# Patient Record
Sex: Female | Born: 1960 | Race: White | Hispanic: No | Marital: Married | Smoking: Former smoker
Health system: Southern US, Community
[De-identification: ages and names within clinical notes are randomized; demographics above are authoritative.]

## PROBLEM LIST (undated history)

## (undated) DIAGNOSIS — C50919 Malignant neoplasm of unspecified site of unspecified female breast: Secondary | ICD-10-CM

## (undated) DIAGNOSIS — F32A Depression, unspecified: Secondary | ICD-10-CM

## (undated) DIAGNOSIS — D649 Anemia, unspecified: Secondary | ICD-10-CM

## (undated) DIAGNOSIS — N200 Calculus of kidney: Secondary | ICD-10-CM

## (undated) DIAGNOSIS — Z923 Personal history of irradiation: Secondary | ICD-10-CM

## (undated) DIAGNOSIS — R238 Other skin changes: Secondary | ICD-10-CM

## (undated) DIAGNOSIS — R233 Spontaneous ecchymoses: Secondary | ICD-10-CM

## (undated) DIAGNOSIS — Z973 Presence of spectacles and contact lenses: Secondary | ICD-10-CM

## (undated) DIAGNOSIS — F329 Major depressive disorder, single episode, unspecified: Secondary | ICD-10-CM

## (undated) DIAGNOSIS — R002 Palpitations: Secondary | ICD-10-CM

## (undated) HISTORY — DX: Spontaneous ecchymoses: R23.3

## (undated) HISTORY — DX: Calculus of kidney: N20.0

## (undated) HISTORY — DX: Presence of spectacles and contact lenses: Z97.3

## (undated) HISTORY — DX: Other skin changes: R23.8

## (undated) HISTORY — PX: KIDNEY STONE SURGERY: SHX686

## (undated) HISTORY — DX: Malignant neoplasm of unspecified site of unspecified female breast: C50.919

## (undated) HISTORY — DX: Major depressive disorder, single episode, unspecified: F32.9

## (undated) HISTORY — DX: Anemia, unspecified: D64.9

## (undated) HISTORY — DX: Depression, unspecified: F32.A

## (undated) HISTORY — DX: Palpitations: R00.2

---

## 1991-09-12 HISTORY — PX: DILATION AND CURETTAGE OF UTERUS: SHX78

## 1998-01-13 ENCOUNTER — Other Ambulatory Visit: Admission: RE | Admit: 1998-01-13 | Discharge: 1998-01-13 | Payer: Self-pay | Admitting: Obstetrics and Gynecology

## 1999-12-02 ENCOUNTER — Encounter: Admission: RE | Admit: 1999-12-02 | Discharge: 1999-12-02 | Payer: Self-pay | Admitting: Obstetrics and Gynecology

## 1999-12-02 ENCOUNTER — Encounter: Payer: Self-pay | Admitting: Obstetrics and Gynecology

## 2000-12-04 ENCOUNTER — Encounter: Payer: Self-pay | Admitting: Obstetrics and Gynecology

## 2000-12-04 ENCOUNTER — Encounter: Admission: RE | Admit: 2000-12-04 | Discharge: 2000-12-04 | Payer: Self-pay | Admitting: Obstetrics and Gynecology

## 2001-12-05 ENCOUNTER — Encounter: Admission: RE | Admit: 2001-12-05 | Discharge: 2001-12-05 | Payer: Self-pay | Admitting: Obstetrics and Gynecology

## 2001-12-05 ENCOUNTER — Encounter: Payer: Self-pay | Admitting: Obstetrics and Gynecology

## 2002-12-08 ENCOUNTER — Encounter: Admission: RE | Admit: 2002-12-08 | Discharge: 2002-12-08 | Payer: Self-pay | Admitting: Obstetrics and Gynecology

## 2002-12-08 ENCOUNTER — Encounter: Payer: Self-pay | Admitting: Obstetrics and Gynecology

## 2004-02-25 ENCOUNTER — Encounter: Admission: RE | Admit: 2004-02-25 | Discharge: 2004-02-25 | Payer: Self-pay | Admitting: Obstetrics and Gynecology

## 2004-07-21 ENCOUNTER — Ambulatory Visit: Payer: Self-pay | Admitting: Family Medicine

## 2004-07-25 ENCOUNTER — Ambulatory Visit: Payer: Self-pay | Admitting: Family Medicine

## 2004-11-14 ENCOUNTER — Ambulatory Visit: Payer: Self-pay | Admitting: Family Medicine

## 2005-01-09 ENCOUNTER — Ambulatory Visit: Payer: Self-pay | Admitting: Family Medicine

## 2005-01-25 ENCOUNTER — Ambulatory Visit: Payer: Self-pay | Admitting: Family Medicine

## 2005-04-26 ENCOUNTER — Encounter: Admission: RE | Admit: 2005-04-26 | Discharge: 2005-04-26 | Payer: Self-pay | Admitting: Obstetrics and Gynecology

## 2005-05-04 ENCOUNTER — Ambulatory Visit: Payer: Self-pay | Admitting: Family Medicine

## 2005-10-09 ENCOUNTER — Ambulatory Visit: Payer: Self-pay | Admitting: Family Medicine

## 2005-11-14 ENCOUNTER — Ambulatory Visit: Payer: Self-pay | Admitting: Family Medicine

## 2005-12-14 ENCOUNTER — Ambulatory Visit: Payer: Self-pay | Admitting: Family Medicine

## 2006-01-01 ENCOUNTER — Ambulatory Visit: Payer: Self-pay | Admitting: Family Medicine

## 2006-02-20 ENCOUNTER — Encounter: Admission: RE | Admit: 2006-02-20 | Discharge: 2006-02-20 | Payer: Self-pay | Admitting: Family Medicine

## 2006-06-18 ENCOUNTER — Ambulatory Visit: Payer: Self-pay | Admitting: Family Medicine

## 2006-06-27 ENCOUNTER — Encounter: Admission: RE | Admit: 2006-06-27 | Discharge: 2006-06-27 | Payer: Self-pay | Admitting: Obstetrics and Gynecology

## 2006-08-21 ENCOUNTER — Ambulatory Visit: Payer: Self-pay | Admitting: Family Medicine

## 2006-09-24 ENCOUNTER — Ambulatory Visit: Payer: Self-pay | Admitting: Family Medicine

## 2006-11-08 ENCOUNTER — Ambulatory Visit: Payer: Self-pay | Admitting: Family Medicine

## 2006-11-13 ENCOUNTER — Ambulatory Visit: Payer: Self-pay | Admitting: Family Medicine

## 2006-11-15 ENCOUNTER — Ambulatory Visit: Payer: Self-pay | Admitting: Family Medicine

## 2006-11-15 ENCOUNTER — Encounter: Admission: RE | Admit: 2006-11-15 | Discharge: 2006-11-15 | Payer: Self-pay | Admitting: Obstetrics and Gynecology

## 2006-11-26 ENCOUNTER — Ambulatory Visit (HOSPITAL_COMMUNITY): Admission: RE | Admit: 2006-11-26 | Discharge: 2006-11-26 | Payer: Self-pay | Admitting: Urology

## 2006-12-10 ENCOUNTER — Ambulatory Visit: Payer: Self-pay | Admitting: Family Medicine

## 2007-01-16 ENCOUNTER — Ambulatory Visit: Payer: Self-pay | Admitting: Family Medicine

## 2007-07-25 ENCOUNTER — Encounter: Admission: RE | Admit: 2007-07-25 | Discharge: 2007-07-25 | Payer: Self-pay | Admitting: Obstetrics and Gynecology

## 2008-09-24 ENCOUNTER — Encounter: Admission: RE | Admit: 2008-09-24 | Discharge: 2008-09-24 | Payer: Self-pay | Admitting: Obstetrics and Gynecology

## 2009-07-06 ENCOUNTER — Ambulatory Visit: Payer: Self-pay | Admitting: Gastroenterology

## 2009-10-13 ENCOUNTER — Encounter: Admission: RE | Admit: 2009-10-13 | Discharge: 2009-10-13 | Payer: Self-pay | Admitting: Obstetrics and Gynecology

## 2010-05-27 ENCOUNTER — Ambulatory Visit: Payer: Self-pay | Admitting: Internal Medicine

## 2010-11-28 ENCOUNTER — Encounter: Payer: Self-pay | Admitting: Nurse Practitioner

## 2010-11-28 DIAGNOSIS — R002 Palpitations: Secondary | ICD-10-CM | POA: Insufficient documentation

## 2010-11-28 DIAGNOSIS — F418 Other specified anxiety disorders: Secondary | ICD-10-CM | POA: Insufficient documentation

## 2010-11-28 DIAGNOSIS — N200 Calculus of kidney: Secondary | ICD-10-CM | POA: Insufficient documentation

## 2010-11-28 DIAGNOSIS — R000 Tachycardia, unspecified: Secondary | ICD-10-CM

## 2010-12-13 ENCOUNTER — Other Ambulatory Visit: Payer: Self-pay | Admitting: Obstetrics and Gynecology

## 2010-12-13 DIAGNOSIS — Z1231 Encounter for screening mammogram for malignant neoplasm of breast: Secondary | ICD-10-CM

## 2010-12-21 ENCOUNTER — Ambulatory Visit
Admission: RE | Admit: 2010-12-21 | Discharge: 2010-12-21 | Disposition: A | Discharge status: Home / Self Care | Payer: BC Managed Care – PPO | Source: Ambulatory Visit | Attending: Obstetrics and Gynecology | Admitting: Obstetrics and Gynecology

## 2010-12-21 DIAGNOSIS — Z1231 Encounter for screening mammogram for malignant neoplasm of breast: Secondary | ICD-10-CM

## 2010-12-23 ENCOUNTER — Other Ambulatory Visit: Payer: Self-pay | Admitting: Obstetrics and Gynecology

## 2010-12-23 DIAGNOSIS — R928 Other abnormal and inconclusive findings on diagnostic imaging of breast: Secondary | ICD-10-CM

## 2011-01-03 ENCOUNTER — Ambulatory Visit
Admission: RE | Admit: 2011-01-03 | Discharge: 2011-01-03 | Disposition: A | Discharge status: Home / Self Care | Payer: BC Managed Care – PPO | Source: Ambulatory Visit | Attending: Obstetrics and Gynecology | Admitting: Obstetrics and Gynecology

## 2011-01-03 DIAGNOSIS — R928 Other abnormal and inconclusive findings on diagnostic imaging of breast: Secondary | ICD-10-CM

## 2011-01-27 NOTE — Op Note (Signed)
NAMEMAKAIYA, Mandy Vargas NO.:  [id number]   MEDICAL RECORD NO.:  [id number]          PATIENT TYPE:  AMB   LOCATION:  DAY                          FACILITY:  [facility]   PHYSICIAN:  Sigmund I. Patsi Sears, M.D.DATE OF BIRTH:  Mar 30, 1961   DATE OF PROCEDURE:  11/26/2006  DATE OF DISCHARGE:                               OPERATIVE REPORT   PREOP DIAGNOSIS:  Impacted left lower ureteral calculus.   POSTOPERATIVE DIAGNOSIS:  Impacted left lower ureteral calculus.   OPERATION:  1. Cystourethroscopy.  2. Left retrograde pyelogram with interpretation.  3. Ureteroscopy.  4. Laser fractionation of left ureteral calculus.  5. Basket extraction of the left ureteral stone.   SURGEON:  Sigmund I. Patsi Sears, M.D.   ANESTHESIA:  General LMA.   OPERATION:  After appropriate preanesthesia, the patient was brought to  the operating room, and placed on the operating table in the dorsal  supine position where general LMA anesthesia was introduced.  She was  then replaced in dorsal lithotomy position where the pubis was prepped  with Betadine solution and draped in the usual fashion.   REVIEW OF HISTORY:  This 50 year old female has had left lower quadrant  and left flank pain, with gross hematuria.  She is known to have a left  UV junction stone measuring 6 mm, but has not passed the stone.  She is  now for basket extraction of the stone.   PROCEDURE:  Cystourethroscopy was accomplished and a left retrograde  pyelogram was accomplished.  Stone was not easily identified on  retrograde pyelogram.  The short ureteroscope was placed in the lower  ureter, and ureteroscopy was accomplished, and finds that the ureter was  dilated.  The stone was identified; and finds that the ureter is  dilated; and the stone was identified floating at the upper third  ureter.  The stone appeared to be rectangular in formation and was photo  photographed.   The laser fiber was passed through the ureter,  and the stone fragmented  in two pieces.  The stone was then basket extracted.  Repeat  ureteroscopy revealed no stone; and repeat retrograde pyelogram revealed  no evidence of further  stone material.  Because of the atraumatic nature of the surgery,  because the patient did not want a double-J catheter; it was elected to  not place a double-J catheter.  Therefore, the ureteroscope and the  guide wire were removed.  She was given IV Toradol, awakened, and taken  to recovery room in good condition.      Sigmund I. Patsi Sears, M.D.  Electronically Signed     SIT/MEDQ  D:  11/26/2006  T:  11/27/2006  Job:  [id number]

## 2011-05-04 ENCOUNTER — Other Ambulatory Visit: Payer: Self-pay | Admitting: Obstetrics and Gynecology

## 2011-05-04 DIAGNOSIS — R921 Mammographic calcification found on diagnostic imaging of breast: Secondary | ICD-10-CM

## 2011-07-06 ENCOUNTER — Ambulatory Visit
Admission: RE | Admit: 2011-07-06 | Discharge: 2011-07-06 | Disposition: A | Discharge status: Home / Self Care | Payer: BC Managed Care – PPO | Source: Ambulatory Visit | Attending: Obstetrics and Gynecology | Admitting: Obstetrics and Gynecology

## 2011-07-06 DIAGNOSIS — R921 Mammographic calcification found on diagnostic imaging of breast: Secondary | ICD-10-CM

## 2011-09-12 DIAGNOSIS — C50919 Malignant neoplasm of unspecified site of unspecified female breast: Secondary | ICD-10-CM

## 2011-09-12 HISTORY — DX: Malignant neoplasm of unspecified site of unspecified female breast: C50.919

## 2011-12-11 ENCOUNTER — Other Ambulatory Visit: Payer: Self-pay | Admitting: Obstetrics and Gynecology

## 2011-12-11 DIAGNOSIS — R921 Mammographic calcification found on diagnostic imaging of breast: Secondary | ICD-10-CM

## 2011-12-15 ENCOUNTER — Other Ambulatory Visit: Payer: Self-pay | Admitting: Obstetrics and Gynecology

## 2011-12-15 ENCOUNTER — Ambulatory Visit
Admission: RE | Admit: 2011-12-15 | Discharge: 2011-12-15 | Disposition: A | Discharge status: Home / Self Care | Payer: BC Managed Care – PPO | Source: Ambulatory Visit | Attending: Obstetrics and Gynecology | Admitting: Obstetrics and Gynecology

## 2011-12-15 DIAGNOSIS — R921 Mammographic calcification found on diagnostic imaging of breast: Secondary | ICD-10-CM

## 2011-12-22 ENCOUNTER — Ambulatory Visit
Admission: RE | Admit: 2011-12-22 | Discharge: 2011-12-22 | Disposition: A | Discharge status: Home / Self Care | Payer: BC Managed Care – PPO | Source: Ambulatory Visit | Attending: Obstetrics and Gynecology | Admitting: Obstetrics and Gynecology

## 2011-12-22 DIAGNOSIS — R921 Mammographic calcification found on diagnostic imaging of breast: Secondary | ICD-10-CM

## 2011-12-22 HISTORY — PX: BREAST BIOPSY: SHX20

## 2011-12-25 ENCOUNTER — Other Ambulatory Visit: Payer: Self-pay | Admitting: Obstetrics and Gynecology

## 2011-12-25 DIAGNOSIS — C50912 Malignant neoplasm of unspecified site of left female breast: Secondary | ICD-10-CM

## 2011-12-26 ENCOUNTER — Other Ambulatory Visit: Payer: Self-pay | Admitting: *Deleted

## 2011-12-26 ENCOUNTER — Telehealth: Payer: Self-pay | Admitting: *Deleted

## 2011-12-26 DIAGNOSIS — D0510 Intraductal carcinoma in situ of unspecified breast: Secondary | ICD-10-CM

## 2011-12-26 NOTE — Telephone Encounter (Signed)
Confirmed [facility] for 01/03/12 at 1200 .  Instructions and contact information given.  

## 2011-12-28 ENCOUNTER — Other Ambulatory Visit: Payer: BC Managed Care – PPO

## 2011-12-28 ENCOUNTER — Ambulatory Visit
Admission: RE | Admit: 2011-12-28 | Discharge: 2011-12-28 | Disposition: A | Discharge status: Home / Self Care | Payer: BC Managed Care – PPO | Source: Ambulatory Visit | Attending: Obstetrics and Gynecology | Admitting: Obstetrics and Gynecology

## 2011-12-28 DIAGNOSIS — C50912 Malignant neoplasm of unspecified site of left female breast: Secondary | ICD-10-CM

## 2011-12-28 MED ORDER — GADOBENATE DIMEGLUMINE 529 MG/ML IV SOLN
12.0000 mL | Freq: Once | INTRAVENOUS | Status: AC | PRN
  Administered 2011-12-28: 12 mL via INTRAVENOUS

## 2012-01-03 ENCOUNTER — Encounter: Payer: Self-pay | Admitting: Oncology

## 2012-01-03 ENCOUNTER — Ambulatory Visit: Payer: BC Managed Care – PPO | Admitting: Oncology

## 2012-01-03 ENCOUNTER — Ambulatory Visit (HOSPITAL_BASED_OUTPATIENT_CLINIC_OR_DEPARTMENT_OTHER): Payer: BC Managed Care – PPO | Admitting: Surgery

## 2012-01-03 ENCOUNTER — Ambulatory Visit: Payer: BC Managed Care – PPO

## 2012-01-03 ENCOUNTER — Other Ambulatory Visit: Payer: BC Managed Care – PPO

## 2012-01-03 ENCOUNTER — Other Ambulatory Visit (INDEPENDENT_AMBULATORY_CARE_PROVIDER_SITE_OTHER): Payer: Self-pay | Admitting: Surgery

## 2012-01-03 ENCOUNTER — Telehealth: Payer: Self-pay | Admitting: Oncology

## 2012-01-03 ENCOUNTER — Encounter: Payer: Self-pay | Admitting: *Deleted

## 2012-01-03 ENCOUNTER — Other Ambulatory Visit: Payer: BC Managed Care – PPO | Admitting: Lab

## 2012-01-03 ENCOUNTER — Ambulatory Visit (HOSPITAL_BASED_OUTPATIENT_CLINIC_OR_DEPARTMENT_OTHER): Payer: BC Managed Care – PPO | Admitting: Oncology

## 2012-01-03 ENCOUNTER — Ambulatory Visit
Admission: RE | Admit: 2012-01-03 | Discharge: 2012-01-03 | Disposition: A | Discharge status: Home / Self Care | Payer: BC Managed Care – PPO | Source: Ambulatory Visit | Attending: Radiation Oncology | Admitting: Radiation Oncology

## 2012-01-03 ENCOUNTER — Ambulatory Visit: Payer: BC Managed Care – PPO | Admitting: Physical Therapy

## 2012-01-03 VITALS — BP 100/66 | HR 62 | Temp 98.8°F | Ht 61.0 in | Wt 129.6 lb

## 2012-01-03 DIAGNOSIS — D0590 Unspecified type of carcinoma in situ of unspecified breast: Secondary | ICD-10-CM | POA: Insufficient documentation

## 2012-01-03 DIAGNOSIS — Z79899 Other long term (current) drug therapy: Secondary | ICD-10-CM | POA: Insufficient documentation

## 2012-01-03 DIAGNOSIS — D0510 Intraductal carcinoma in situ of unspecified breast: Secondary | ICD-10-CM

## 2012-01-03 DIAGNOSIS — Z800 Family history of malignant neoplasm of digestive organs: Secondary | ICD-10-CM | POA: Insufficient documentation

## 2012-01-03 DIAGNOSIS — Z803 Family history of malignant neoplasm of breast: Secondary | ICD-10-CM | POA: Insufficient documentation

## 2012-01-03 LAB — COMPREHENSIVE METABOLIC PANEL
ALT: 14 U/L (ref 0–35)
AST: 19 U/L (ref 0–37)
Alkaline Phosphatase: 54 U/L (ref 39–117)
CO2: 29 mEq/L (ref 19–32)
Calcium: 9.3 mg/dL (ref 8.4–10.5)
Chloride: 100 mEq/L (ref 96–112)
Glucose, Bld: 91 mg/dL (ref 70–99)
Potassium: 3.8 mEq/L (ref 3.5–5.3)
Sodium: 137 mEq/L (ref 135–145)
Total Protein: 6.8 g/dL (ref 6.0–8.3)

## 2012-01-03 LAB — CBC WITH DIFFERENTIAL/PLATELET
EOS%: 1.8 % (ref 0.0–7.0)
HGB: 12.6 g/dL (ref 11.6–15.9)
MCH: 30.7 pg (ref 25.1–34.0)
NEUT#: 4.2 10*3/uL (ref 1.5–6.5)
RBC: 4.1 10*6/uL (ref 3.70–5.45)
RDW: 12.8 % (ref 11.2–14.5)
WBC: 5.8 10*3/uL (ref 3.9–10.3)
lymph#: 1.2 10*3/uL (ref 0.9–3.3)
nRBC: 0 % (ref 0–0)

## 2012-01-03 NOTE — Progress Notes (Addendum)
Re:   Mandy Vargas DOB:   April 29, 1961 MRN:   [id number]  [facility]  ASSESSMENT AND PLAN: 1.  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  Will need wire localization.  Tumor is located in the upper central portion of left breast on MRI.  I discussed the options for breast cancer treatment with the patient.  I discussed the idea of a multidisciplinary approach to the treatment of breast cancer, which often includes medical oncology and radiation oncology.  I discussed the surgical options of lumpectomy vs. mastectomy.  I discussed the options of lymph node biopsy.   The risks of surgery include, but are not limited to, bleeding, infection, the need for further surgery, and nerve injury.  Seeing Drs. Park Breed and Michell Heinrich.  2.  Irregular heart rate/palpitations, on Inderal. 3.  Depression.  Has seen Dr. Lafayette Dragon once. 4.  History of kidney stones. 5.  For genetic testing first, before posting surgery, since it would dramatically change the planned surgery. [Genetic testing was negative.  DN 02/02/2012]  REFERRING PHYSICIAN:  Dr. Orvan July, [facility]  HISTORY OF PRESENT ILLNESS: Mandy Vargas is a 51 y.o. (DOB: 10-07-1960)  white female whose primary care physician is Dr. Orvan July, [facility], and comes to [facility] today for a left breast cancer.  She comes by herself.  She comes by her self.  She said the [facility] had been following some microcalcifications.  Because of changes in the microcalcifications, she had a biopsy 12/22/2011 which showed DCIS.  She is still having periods.  She is on BCP, which she will stop.  She has had no prior breast biopsy.  She has a sister, Etheleen Sia, who had breast cancer treated by Dr. Cindee Lame in 2008.    Past Medical History  Diagnosis Date  . Irregular heartbeat   . Irregular heart beat   . Wears glasses   . Bruises easily   . Depression   . Anemia   . Breast cancer DCIS      Past Surgical History    Procedure Date  . Dilation and curettage of uterus 1993  . Kidney stone surgery       Current Outpatient Prescriptions  Medication Sig Dispense Refill  . buPROPion (WELLBUTRIN) 100 MG tablet Take 100 mg by mouth 2 (two) times daily.        . citalopram (CELEXA) 40 MG tablet Take 40 mg by mouth daily.      . clonazePAM (KLONOPIN) 1 MG tablet Take 1 mg by mouth 2 (two) times daily as needed.        . desogestrel-ethinyl estradiol (KARIVA,AZURETTE,MIRCETTE) 0.15-0.02/0.01 MG (21/5) tablet Take 1 tablet by mouth daily.      . Multiple Vitamin (MULTIVITAMIN) tablet Take 1 tablet by mouth daily.      . propranolol (INDERAL) 60 MG tablet Take 60 mg by mouth daily at 8 pm.         No Known Allergies  REVIEW OF SYSTEMS: Skin:  No history of rash.  No history of abnormal moles. Infection:  No history of hepatitis or HIV.  No history of MRSA. Neurologic:  No history of stroke.  No history of seizure.  No history of headaches. Cardiac:  Irregular heart rate/palpitations.  Does not see a cardiologist. Pulmonary:  Does not smoke cigarettes.  No asthma or bronchitis.  Endocrine:  No diabetes. No thyroid disease. Gastrointestinal:  No history of  stomach disease.  No history of liver disease.  No history of gall bladder disease.  No history of pancreas disease.  No history of colon disease. GYN:  Has seen Dr. Charma Igo in the past. Urologic:  No history of kidney stones.  No history of bladder infections. Musculoskeletal:  No history of joint or back disease. Hematologic:  No bleeding disorder.  No history of anemia. Psycho-social:  The patient is oriented.  History of depression.  SOCIAL and FAMILY HISTORY: Married, though husband not with patient.  He is a Visual merchandiser. 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 One son - 69.  PHYSICAL EXAM: LMP 12/01/2011  General: WN WF who is alert and generally healthy appearing.  HEENT: Normal. Pupils equal. Good  dentition. Neck: Supple. No mass.  No thyroid mass.  Carotid pulse okay with no bruit. Lymph Nodes:  No supraclavicular or cervical nodes. Lungs: Clear to auscultation and symmetric breath sounds. Heart:  RRR. No murmur or rub. Breasts:  Left: Scar at 4 o'clock position in left breast.  Right: No abnormality.  Abdomen: Soft. No mass. No tenderness. No hernia. Normal bowel sounds.  No abdominal scars. Rectal: Not done. Extremities:  Good strength and ROM  in upper and lower extremities. Neurologic:  Grossly intact to motor and sensory function. Psychiatric: Has normal mood and affect. Behavior is normal.   DATA REVIEWED: Path report and mammograms  Ovidio Kin, MD,  [facility] [address] [place]  [address]   [place], [place]    [address] Phone:  [phone] FAX:  [phone]

## 2012-01-03 NOTE — Telephone Encounter (Signed)
S/w the pt and she is aware of her may 2013 appt °

## 2012-01-03 NOTE — Progress Notes (Signed)
Patient came in today as a new patient she has one insurance,she said she thinks she is [place] for now but she would call us if she need any assistance.I gave her Alcario Drought card.

## 2012-01-03 NOTE — Patient Instructions (Signed)
1. Genetic testing  2. B-43 clinical trial  3. I will see you back in 1 month

## 2012-01-03 NOTE — Progress Notes (Signed)
Mailed after appt letter to pt. 

## 2012-01-03 NOTE — Progress Notes (Signed)
Mandy Vargas [id number] 1960/10/25 51 y.o. 01/03/2012 9:46 AM  CC Dr. Mathis Bud Dr. Lurline Hare Dr. Leodis Sias Dr. Marcelle Overlie  REASON FOR CONSULTATION:  51 year old female with new diagnosis of ductal carcinoma in situ of the left breast diagnosed 12/22/2011. Patient was seen in the multidisciplinary breast clinic today for discussion of treatment options.  STAGE:   Stage 0 (DCIS)  REFERRING PHYSICIAN: Dr. Ovidio Kin  HISTORY OF PRESENT ILLNESS:  Mandy Vargas is a 51 y.o. female.  With medical history significant for depression and. Irregular heart beat. Patient has been receiving mammograms since the age of 8. About 6 months ago she was found to have calcifications. She had a followup six-month mammogram performed on 12/15/2011 the left breast revealed suspicious calcifications on a monogram. She went on to have a diagnostic mammogram and thereafter had an ultrasound-guided biopsy performed on 12/22/2011. The biopsy showed a ductal carcinoma in situ with calcifications appearing as an intermediate grade. The tumor was estrogen receptor +100% progesterone receptor +100%. Patient then on 12/28/2011 had an MRI of the breasts performed that showed upper central portion of left breast with biopsy changes and area of 9 x 9 x 6 mm. She is now seen in the multidisciplinary breast clinic with for discussion of her treatment options. Her case was discussed at the multidisciplinary breast conference this morning. She is without any complaints other than being very anxious about her treatment options.   Past Medical History: Past Medical History  Diagnosis Date  . Irregular heartbeat   . Irregular heart beat   . Wears glasses   . Bruises easily   . Depression   . Anemia   . Breast cancer DCIS    Past Surgical History: Past Surgical History  Procedure Date  . Dilation and curettage of uterus 1993  . Kidney stone surgery     Family History: Family History  Problem  Relation Age of Onset  . Colon cancer Mother   . Breast cancer Sister   . 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 She is not a smoker does not drink no history of alcohol use or any other recreational drug use. History  Substance Use Topics  . Smoking status: Never Smoker   . Smokeless tobacco: Not on file  . Alcohol Use: No    Allergies: No Known Allergies  Current Medications: Current Outpatient Prescriptions  Medication Sig Dispense Refill  . buPROPion (WELLBUTRIN) 100 MG tablet Take 100 mg by mouth 2 (two) times daily.        . citalopram (CELEXA) 40 MG tablet Take 40 mg by mouth daily.      . clonazePAM (KLONOPIN) 1 MG tablet Take 1 mg by mouth 2 (two) times daily as needed.        . desogestrel-ethinyl estradiol (KARIVA,AZURETTE,MIRCETTE) 0.15-0.02/0.01 MG (21/5) tablet Take 1 tablet by mouth daily.      . Multiple Vitamin (MULTIVITAMIN) tablet Take 1 tablet by mouth daily.      . propranolol (INDERAL) 60 MG tablet Take 60 mg by mouth daily at 8 pm.        OB/GYN History:Menarche at age 14 she is premenopausal her last period was about 3 weeks ago. She had her first term pregnancy at age 33. She has completed all of her family. She had a colonoscopy in 2011 bone density 2010 last Pap smear  November 2012 first mammogram in 2000.  ECOG PERFORMANCE STATUS: 0 - Asymptomatic  Genetic Counseling/testing: A complete history was taken patient's father is deceased at age 80 patient's mother is alive at age 7 she has one sister. Paternal grandmother had colon cancer in her 46s there are mother had a malignant polyp that was removed and patient has a sister who had breast cancer 43 there is also an aunt with breast cancer. Because of family history patient was recommended genetic counseling and testing and a referral has been made.  REVIEW OF SYSTEMS: Currently patient denies any fevers  chills night sweats headaches shortness of breath chest pains palpitations she has no nausea or vomiting no myalgias or arthralgias she is depressed but this is chronic ongoing she is on multiple medications she does also take medications to help her sleep at night. She has no hematuria hematochezia melena hemoptysis hematemesis no weight loss or weight gain and remainder of the 14 point review of systems is negative.  PHYSICAL EXAMINATION: Blood pressure 100/66, pulse 62, temperature 98.8 F (37.1 C), temperature source Oral, height 5\' 1"  (1.549 m), weight 129 lb 9.6 oz (58.786 kg), last menstrual period 12/01/2011.  WUJ:WJXBJ, healthy, no distress, well nourished, well developed and anxious SKIN: skin color, texture, turgor are normal, no rashes or significant lesions HEAD: Normocephalic EYES: PERRLA, EOMI, Conjunctiva are pink and non-injected, sclera clear EARS: External ears normal OROPHARYNX:no exudate, no erythema and lips, buccal mucosa, and tongue normal  NECK: supple, no adenopathy, no stridor, non-tender LYMPH:  no palpable lymphadenopathy, no hepatosplenomegaly BREAST:Left breast reveals an area of ecchymosis without any palpable masses or nipple discharge. Right breast no masses or nipple discharge. LUNGS: clear to auscultation and percussion HEART: regular rate & rhythm, no murmurs and no gallops ABDOMEN:abdomen soft, non-tender, normal bowel sounds and no masses or organomegaly BACK: Back symmetric, no curvature. EXTREMITIES:no edema, no clubbing, no cyanosis  NEURO: alert & oriented x 3 with fluent speech, no focal motor/sensory deficits, gait normal  STUDIES/RESULTS: Mr Breast Bilateral W Wo Contrast  12/28/2011  *RADIOLOGY REPORT*  Clinical Data: Recent diagnosis of intermediate grade ductal carcinoma in situ left breast following stereotactic guided core biopsy. The patient's sister was diagnosed with breast cancer at age 35.  BUN and creatinine were obtained on site at  [facility] at [address] Results:  BUN 0.8 mg/dL,  Creatinine 18 mg/dL. BILATERAL BREAST MRI WITH AND WITHOUT CONTRAST  Technique: Multiplanar, multisequence MR images of both breasts were obtained prior to and following the intravenous administration of 12ml of Multihance.  Three dimensional images were evaluated at the independent DynaCad workstation.  Comparison:  Mammogram from [facility] 12/22/2011 and earlier  Findings: Within the upper central portion of the left breast, there is post biopsy change.  Enhancing biopsy tract is identified. Adjacent to the tract, there is a small area of non mass enhancement which shows persistent type enhancement kinetics and measures 9 x 9 x 6 mm.  This is in the region of biopsy clip, consistent with recent core biopsy showing ductal carcinoma in situ.  Elsewhere within the left breast, no suspicious enhancement is identified.  Background parenchymal enhancement is minimal.  Within the right breast, no suspicious enhancement is identified. No suspicious internal mammary or axillary lymph nodes are identified.  IMPRESSION:  1.  Expected biopsy changes in the left breast following stereotactic guided core biopsy. 2.  Small area of non mass enhancement, consistent with known ductal  carcinoma in situ adjacent to biopsy clip.  THREE-DIMENSIONAL MR IMAGE RENDERING ON INDEPENDENT WORKSTATION:  Three-dimensional MR images were rendered by post-processing of the original MR data on an independent workstation.  The three- dimensional MR images were interpreted, and findings were reported in the accompanying complete MRI report for this study.  BI-RADS CATEGORY 6:  Known biopsy-proven malignancy - appropriate action should be taken.  Original Report Authenticated By: Patterson Hammersmith, M.D.   Mm Breast Stereo Biopsy Left  12/25/2011  **ADDENDUM** CREATED: 12/25/2011 13:17:10  Intermediate grade ductal carcinoma in situ was reported  histologically.  This corresponds well with the imaging findings. The patient was given the results of the biopsy by telephone.  She states the wound site is clean and dry with no hematoma or signs of infection.  The patient will be seen at the Multidisciplinary Clinic on 01/03/2012.  Breast MRI has been arranged.  Addended by:  Littie Deeds. Judyann Munson, M.D. on 12/25/2011 13:17:10.  **END ADDENDUM** SIGNED BY: Littie Deeds. Judyann Munson, M.D.    12/22/2011  *RADIOLOGY REPORT*  Clinical Data:  Patient presents for stereotactic needle biopsy of a group of microcalcifications in the upper outer quadrant of the left breast.  STEREOTACTIC-GUIDED VACUUM ASSISTED BIOPSY OF THE LEFT BREAST AND SPECIMEN RADIOGRAPH  I met with the patient and we discussed the procedure of stereotactic-guided biopsy, including benefits and alternatives. We discussed the high likelihood of a successful procedure. We discussed the risks of the procedure, including infection, bleeding, tissue injury, clip migration, and inadequate sampling. Informed, written consent was given.  Using sterile technique, 2% lidocaine, stereotactic guidance, and a 9 gauge vacuum assisted device, biopsy was performed of the targeted microcalcifications in the left upper outer quadrant. Specimen radiograph was performed, showing multiple of the targeted microcalcifications.  Specimens with calcifications are identified for pathology.  At the conclusion of the procedure, a T-shaped tissue marker clip was deployed into the biopsy cavity.  Follow-up 2-view mammogram confirmed clip accurately at the biopsy site.  IMPRESSION: Stereotactic-guided biopsy of a group of indeterminate microcalcifications in the left upper outer quadrant.  No apparent complications.  Original Report Authenticated By: Littie Deeds. Judyann Munson, M.D.   Mm Breast Surgical Specimen  12/25/2011  **ADDENDUM** CREATED: 12/25/2011 13:17:10  Intermediate grade ductal carcinoma in situ was reported histologically.  This corresponds well  with the imaging findings. The patient was given the results of the biopsy by telephone.  She states the wound site is clean and dry with no hematoma or signs of infection.  The patient will be seen at the Multidisciplinary Clinic on 01/03/2012.  Breast MRI has been arranged.  Addended by:  Littie Deeds. Judyann Munson, M.D. on 12/25/2011 13:17:10.  **END ADDENDUM** SIGNED BY: Littie Deeds. Judyann Munson, M.D.    12/22/2011  *RADIOLOGY REPORT*  Clinical Data:  Patient presents for stereotactic needle biopsy of a group of microcalcifications in the upper outer quadrant of the left breast.  STEREOTACTIC-GUIDED VACUUM ASSISTED BIOPSY OF THE LEFT BREAST AND SPECIMEN RADIOGRAPH  I met with the patient and we discussed the procedure of stereotactic-guided biopsy, including benefits and alternatives. We discussed the high likelihood of a successful procedure. We discussed the risks of the procedure, including infection, bleeding, tissue injury, clip migration, and inadequate sampling. Informed, written consent was given.  Using sterile technique, 2% lidocaine, stereotactic guidance, and a 9 gauge vacuum assisted device, biopsy was performed of the targeted microcalcifications in the left upper outer quadrant. Specimen radiograph was performed, showing multiple of the targeted microcalcifications.  Specimens with calcifications are identified for pathology.  At the conclusion of the procedure, a T-shaped tissue marker clip was deployed into the biopsy cavity.  Follow-up 2-view mammogram confirmed clip accurately at the biopsy site.  IMPRESSION: Stereotactic-guided biopsy of a group of indeterminate microcalcifications in the left upper outer quadrant.  No apparent complications.  Original Report Authenticated By: Littie Deeds. Judyann Munson, M.D.   Mm Digital Diagnostic Bilat  12/15/2011  *RADIOLOGY REPORT*  Clinical Data:  Short-term interval follow-up of calcifications in the left breast  DIGITAL DIAGNOSTIC BILATERAL MAMMOGRAM WITH CAD  Comparison:  With priors   Findings:  There is a dense fibroglandular pattern.  The right breast is negative.  In the upper outer quadrant of the left breast there is a developing cluster of calcifications measuring 8 x 5 x 5 mm.  Some of these are coarse but they vary in size and shape. Tissue sampling is recommended.  There is no suspicious mass.  Mammographic images were processed with CAD.  IMPRESSION: Suspicious left breast calcifications.  Tissue sampling is recommended.  Stereotactic biopsy has been scheduled.  BI-RADS CATEGORY 4:  Suspicious abnormality - biopsy should be considered.  Original Report Authenticated By: Littie Deeds. Judyann Munson, M.D.     LABS:    Chemistry      Component Value Date/Time   NA 137 01/03/2012 0801   K 3.8 01/03/2012 0801   CL 100 01/03/2012 0801   CO2 29 01/03/2012 0801   BUN 14 01/03/2012 0801   CREATININE 0.84 01/03/2012 0801      Component Value Date/Time   CALCIUM 9.3 01/03/2012 0801   ALKPHOS 54 01/03/2012 0801   AST 19 01/03/2012 0801   ALT 14 01/03/2012 0801   BILITOT 0.3 01/03/2012 0801      Lab Results  Component Value Date   WBC 5.8 01/03/2012   HGB 12.6 01/03/2012   HCT 37.5 01/03/2012   MCV 91.5 01/03/2012   PLT 174 01/03/2012       PATHOLOGY: ADDITIONAL INFORMATION: PROGNOSTIC INDICATORS - ACIS Results IMMUNOHISTOCHEMICAL AND MORPHOMETRIC ANALYSIS BY THE AUTOMATED CELLULAR IMAGING SYSTEM (ACIS) Estrogen Receptor (Negative, <1%): 100%, STRONG STAINING INTENSITY Progesterone Receptor (Negative, <1%): 100%, STRONG STAINING INTENSIT Y All controls stained appropriately FINAL DIAGNOSIS Diagnosis Breast, left, needle core biopsy, UOQ - DUCTAL CARCINOMA IN SITU WITH CALCIFICATIONS. - SEE COMMENT. Microscopic Comment Although grade is best determined at time of surgical excision, the carcinoma appears intermediate grade. Estrogen receptor and progesterone receptor studies will be performed and the results reported separately. The result were called to [facility] on 12/25/2011. (JBK:gt, 12/25/11) Pecola Leisure MD Pathologist, Electronic Signature  ASSESSMENT    51 year old female who on recent mammogram was found to have suspicious calcifications in the left breast. She went on to have a needle core biopsy performed that shows a ductal carcinoma in situ with calcifications on biopsy intermediate grade tumor is estrogen receptor +100% progesterone receptor +100%. Patient is seen in the multidisciplinary breast clinic for discussion of treatment options. Her case was discussed at the multidisciplinary breast clinic.    PLAN:    Patient was seen in the multidisciplinary breast clinic fashion. She was seen by Dr. Ovidio Kin and Dr. Lurline Hare as well as myself. Prior to seeing the patient her case was discussed extensively we reviewed her pathology as well as her radiology personally. Per NCCN guidelines a lumpectomy way it was recommended to. We also recommended patient receive postlumpectomy radiation therapy. I discussed chemoprevention with  tamoxifen since patient is premenopausal. Risks and benefits of tamoxifen were discussed with the patient. However this would start after patient completed radiation therapy. Patient also is eligible for NSABP B. 43 study evaluating Herceptin in early rest cancer such as ductal carcinoma in situ. At this time patient is on certain whether she wants to participate in this study are not.  My plan would be to see the patient back after she has her surgery we would discuss the clinical trial again and if she does not want to be on this study then she will be referred back to Dr. Lurline Hare for radiation first and then I will see her back for discussion of chemoprevention.       Thank you so much for allowing me to participate in the care of Mandy Vargas. I will continue to follow up the patient with you and assist in her care.  All questions were answered. The patient knows to call the clinic with any  problems, questions or concerns. We can certainly see the patient much sooner if necessary.  I spent 60 minutes counseling the patient face to face. The total time spent in the appointment was 60 minutes.  Drue Second, MD Medical/Oncology [facility] [phone] (beeper) [phone] (Office)  01/03/2012, 9:46 AM 01/03/2012, 9:46 AM

## 2012-01-03 NOTE — Progress Notes (Signed)
Radiation Oncology         [phone] ________________________________  Initial outpatient Consultation  Name: Mandy Vargas MRN: [id number]  Date: 01/03/2012  DOB: 29-May-1961  CC: Mandy Lew, MD Mandy Cocking, MD   REFERRING PHYSICIAN: Kandis Cocking, MD  DIAGNOSIS: DCIS of the left breast  HISTORY OF PRESENT ILLNESS::Mandy Vargas is a 52 y.o. female who is  referred today in my opinion regarding radiation in the management of her newly diagnosed ductal carcinoma in situ. She was found on screening mammogram to have calcifications in the left breast. Six-month followup was recommended. When this was performed earlier this month calcifications measuring 8 x 5 x 5 mm were noted.  Biopsy was performed which confirmed high-grade ductal carcinoma in situ. Calcifications and necrosis were noted within the specimen. The tumor was ER and PR positive. She was referred to multidisciplinary clinic today for our opinion regarding her new diagnosis. She reports no symptoms prior to her biopsy. She does live in May again and is interested in receiving treatment at [facility].   PREVIOUS RADIATION THERAPY: No  PAST MEDICAL HISTORY:  has a past medical history of Irregular heartbeat; Irregular heart beat; Wears glasses; Bruises easily; Depression; Anemia; and Breast cancer (DCIS).    PAST SURGICAL HISTORY: Past Surgical History  Procedure Date  . Dilation and curettage of uterus 1993  . Kidney stone surgery     FAMILY HISTORY: family history includes Breast cancer in her sister and Colon cancer in her mother and paternal grandmother.  SOCIAL HISTORY:  reports that she has never smoked. She does not have any smokeless tobacco history on file. She reports that she does not drink alcohol.  GYN HISTORY: She had menses at 11. Her last menstrual cycle was 3 weeks ago. She is GX P1. She did use birth control pills but stopped using them in 2007.  ALLERGIES: Review of patient's allergies indicates  no known allergies.  MEDICATIONS:  Current Outpatient Prescriptions  Medication Sig Dispense Refill  . buPROPion (WELLBUTRIN) 100 MG tablet Take 100 mg by mouth 2 (two) times daily.        . citalopram (CELEXA) 40 MG tablet Take 40 mg by mouth daily.      . clonazePAM (KLONOPIN) 1 MG tablet Take 1 mg by mouth 2 (two) times daily as needed.        . desogestrel-ethinyl estradiol (KARIVA,AZURETTE,MIRCETTE) 0.15-0.02/0.01 MG (21/5) tablet Take 1 tablet by mouth daily.      . Multiple Vitamin (MULTIVITAMIN) tablet Take 1 tablet by mouth daily.      . propranolol (INDERAL) 60 MG tablet Take 60 mg by mouth daily at 8 pm.        REVIEW OF SYSTEMS:  A 15 point review of systems is documented in the electronic medical record. This was obtained by the nursing staff. However, I reviewed this with the patient to discuss relevant findings and make appropriate changes.     PHYSICAL EXAM:  vitals were not taken for this visit.  She is a pleasant female in no distress sitting comfortably examining table. She appears her stated age. She has no palpable cervical supraclavicular or axillary adenopathy. Is very minimal biopsy change palpable in the upper outer quadrant of the left breast. No palpable abnormalities of the right breast. She is alert and oriented x3.  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 The patient's sister was diagnosed  with breast cancer at age 62.  BUN and creatinine were obtained on site at [facility] at [address] Results:  BUN 0.8 mg/dL,  Creatinine 18 mg/dL. BILATERAL BREAST MRI WITH AND WITHOUT CONTRAST  Technique: Multiplanar, multisequence MR images of both breasts were obtained prior to and following the intravenous administration of 12ml of Multihance.  Three dimensional images were evaluated at the independent DynaCad workstation.  Comparison:  Mammogram from the [facility] 12/22/2011 and earlier  Findings: Within the upper central portion of the left breast, there is post biopsy change.  Enhancing biopsy tract is identified. Adjacent to the tract, there is a small area of non mass enhancement which shows persistent type enhancement kinetics and measures 9 x 9 x 6 mm.  This is in the region of biopsy clip, consistent with recent core biopsy showing ductal carcinoma in situ.  Elsewhere within the left breast, no suspicious enhancement is identified.  Background parenchymal enhancement is minimal.  Within the right breast, no suspicious enhancement is identified. No suspicious internal mammary or axillary lymph nodes are identified.  IMPRESSION:  1.  Expected biopsy changes in the left breast following stereotactic guided core biopsy. 2.  Small area of non mass enhancement, consistent with known ductal carcinoma in situ adjacent to biopsy clip.  THREE-DIMENSIONAL MR IMAGE RENDERING ON INDEPENDENT WORKSTATION:  Three-dimensional MR images were rendered by post-processing of the original MR data on an independent workstation.  The three- dimensional MR images were interpreted, and findings were reported in the accompanying complete MRI report for this study.  BI-RADS CATEGORY 6:  Known biopsy-proven malignancy - appropriate action should be taken.  Original Report Authenticated By: Patterson Hammersmith, M.D.   Mm Breast Stereo Biopsy Left  12/25/2011  **ADDENDUM** CREATED:  12/25/2011 13:17:10  Intermediate grade ductal carcinoma in situ was reported histologically.  This corresponds well with the imaging findings. The patient was given the results of the biopsy by telephone.  She states the wound site is clean and dry with no hematoma or signs of infection.  The patient will be seen at the Multidisciplinary Clinic on 01/03/2012.  Breast MRI has been arranged.  Addended by:  Littie Deeds. Judyann Munson, M.D. on 12/25/2011 13:17:10.  **END ADDENDUM** SIGNED BY: Littie Deeds. Judyann Munson, M.D.    12/22/2011  *RADIOLOGY REPORT*  Clinical Data:  Patient presents for stereotactic needle biopsy of a group of microcalcifications in the upper outer quadrant of the left breast.  STEREOTACTIC-GUIDED VACUUM ASSISTED BIOPSY OF THE LEFT BREAST AND SPECIMEN RADIOGRAPH  I met with the patient and we discussed the procedure of stereotactic-guided biopsy, including benefits and alternatives. We discussed the high likelihood of a successful procedure. We discussed the risks of the procedure, including infection, bleeding, tissue injury, clip migration, and inadequate sampling. Informed, written consent was given.  Using sterile technique, 2% lidocaine, stereotactic guidance, and  a 9 gauge vacuum assisted device, biopsy was performed of the targeted microcalcifications in the left upper outer quadrant. Specimen radiograph was performed, showing multiple of the targeted microcalcifications.  Specimens with calcifications are identified for pathology.  At the conclusion of the procedure, a T-shaped tissue marker clip was deployed into the biopsy cavity.  Follow-up 2-view mammogram confirmed clip accurately at the biopsy site.  IMPRESSION: Stereotactic-guided biopsy of a group of indeterminate microcalcifications in the left upper outer quadrant.  No apparent complications.  Original Report Authenticated By: Littie Deeds. Judyann Munson, M.D.   Mm Breast Surgical Specimen  12/25/2011  **ADDENDUM** CREATED: 12/25/2011 13:17:10  Intermediate grade  ductal carcinoma in situ was reported histologically.  This corresponds well with the imaging findings. The patient was given the results of the biopsy by telephone.  She states the wound site is clean and dry with no hematoma or signs of infection.  The patient will be seen at the Multidisciplinary Clinic on 01/03/2012.  Breast MRI has been arranged.  Addended by:  Littie Deeds. Judyann Munson, M.D. on 12/25/2011 13:17:10.  **END ADDENDUM** SIGNED BY: Littie Deeds. Judyann Munson, M.D.    12/22/2011  *RADIOLOGY REPORT*  Clinical Data:  Patient presents for stereotactic needle biopsy of a group of microcalcifications in the upper outer quadrant of the left breast.  STEREOTACTIC-GUIDED VACUUM ASSISTED BIOPSY OF THE LEFT BREAST AND SPECIMEN RADIOGRAPH  I met with the patient and we discussed the procedure of stereotactic-guided biopsy, including benefits and alternatives. We discussed the high likelihood of a successful procedure. We discussed the risks of the procedure, including infection, bleeding, tissue injury, clip migration, and inadequate sampling. Informed, written consent was given.  Using sterile technique, 2% lidocaine, stereotactic guidance, and a 9 gauge vacuum assisted device, biopsy was performed of the targeted microcalcifications in the left upper outer quadrant. Specimen radiograph was performed, showing multiple of the targeted microcalcifications.  Specimens with calcifications are identified for pathology.  At the conclusion of the procedure, a T-shaped tissue marker clip was deployed into the biopsy cavity.  Follow-up 2-view mammogram confirmed clip accurately at the biopsy site.  IMPRESSION: Stereotactic-guided biopsy of a group of indeterminate microcalcifications in the left upper outer quadrant.  No apparent complications.  Original Report Authenticated By: Littie Deeds. Judyann Munson, M.D.   Mm Digital Diagnostic Bilat  12/15/2011  *RADIOLOGY REPORT*  Clinical Data:  Short-term interval follow-up of calcifications in the left  breast  DIGITAL DIAGNOSTIC BILATERAL MAMMOGRAM WITH CAD  Comparison:  With priors  Findings:  There is a dense fibroglandular pattern.  The right breast is negative.  In the upper outer quadrant of the left breast there is a developing cluster of calcifications measuring 8 x 5 x 5 mm.  Some of these are coarse but they vary in size and shape. Tissue sampling is recommended.  There is no suspicious mass.  Mammographic images were processed with CAD.  IMPRESSION: Suspicious left breast calcifications.  Tissue sampling is recommended.  Stereotactic biopsy has been scheduled.  BI-RADS CATEGORY 4:  Suspicious abnormality - biopsy should be considered.  Original Report Authenticated By: Littie Deeds. Judyann Munson, M.D.      IMPRESSION: DCIS of the left breast  PLAN: I spoke with Dondra Spry today regarding her newly diagnosed DCIS. We discussed her excellent prognosis. We discussed the role of radiation in decreasing local failure and patient selected breast conservation. We discussed the process of simulation and the placement of tattoos. We discussed 6 weeks of treatment as an outpatient. We discussed the possible  short and long-term side effects of treatment including but not limited to fatigue skin redness and damage to the lungs and heart. We discussed mechanisms in place to reduce the side effects. She would like to continue working during her treatments. I have encouraged her to ask Dr. Ezzard Standing to refer her back to me for followup and treatment planning after her surgery. This would be performed at [facility]. I spent 60 minutes minutes face to face with the patient and more than 50% of that time was spent in counseling and/or coordination of care.   ------------------------------------------------

## 2012-01-05 ENCOUNTER — Encounter: Payer: Self-pay | Admitting: *Deleted

## 2012-01-05 NOTE — Progress Notes (Signed)
Encounter addended by: Amanda Pea, RN on: 01/05/2012  5:00 PM<BR>     Documentation filed: Visit Diagnoses, Charges VN

## 2012-01-05 NOTE — Progress Notes (Signed)
[facility] Psychosocial Distress Screening Clinical Social Work  Clinical Social Work was referred by distress screening protocol.  The patient scored a 5 on the Psychosocial Distress Thermometer which indicates mild distress. Support team member met with pt in [facility] to assess for distress and other psychosocial needs.  Pt was informed of support services at [facility], and appropriate interventions were provided.     Clinical Social Worker follow up needed: Not at this time.   Tamala Julian, MSW, LCSW Clinical Social Worker [facility] [phone]

## 2012-01-07 ENCOUNTER — Telehealth: Payer: Self-pay | Admitting: Genetic Counselor

## 2012-01-07 NOTE — Telephone Encounter (Signed)
Told patient that we would need to r/s the appointment and that someone would call in the next day or so to do that.

## 2012-01-08 ENCOUNTER — Telehealth: Payer: Self-pay | Admitting: *Deleted

## 2012-01-08 ENCOUNTER — Encounter: Payer: BC Managed Care – PPO | Admitting: Genetic Counselor

## 2012-01-08 NOTE — Telephone Encounter (Signed)
Left vm for pt return call regarding [facility] from 01/03/12.

## 2012-01-08 NOTE — Telephone Encounter (Signed)
Spoke to pt concerning [facility] from 01/03/12.  Pt denies questions or concerns regarding dx or treatment care plan.  Encourage pt to call with needs.  Received verbal understanding.  Contact information given.

## 2012-01-18 ENCOUNTER — Telehealth: Payer: Self-pay | Admitting: *Deleted

## 2012-01-18 NOTE — Telephone Encounter (Signed)
Confirmed 01/23/12 appt w/ pt.

## 2012-01-19 ENCOUNTER — Telehealth: Payer: Self-pay | Admitting: *Deleted

## 2012-01-19 ENCOUNTER — Encounter: Payer: Self-pay | Admitting: *Deleted

## 2012-01-19 NOTE — Telephone Encounter (Signed)
per orders from 01-19-2012 moved patient's appointment to 02-29-2012 at 3:30pm cancelled 02-01-2012 left voice message informing the patient of the new date and time on 02-29-2012 at 3:30

## 2012-01-23 ENCOUNTER — Other Ambulatory Visit: Payer: BC Managed Care – PPO | Admitting: Lab

## 2012-01-23 ENCOUNTER — Ambulatory Visit (HOSPITAL_BASED_OUTPATIENT_CLINIC_OR_DEPARTMENT_OTHER): Payer: BC Managed Care – PPO | Admitting: Genetic Counselor

## 2012-01-23 DIAGNOSIS — D0510 Intraductal carcinoma in situ of unspecified breast: Secondary | ICD-10-CM

## 2012-01-23 DIAGNOSIS — D0590 Unspecified type of carcinoma in situ of unspecified breast: Secondary | ICD-10-CM

## 2012-01-23 DIAGNOSIS — Z803 Family history of malignant neoplasm of breast: Secondary | ICD-10-CM

## 2012-01-23 DIAGNOSIS — Z800 Family history of malignant neoplasm of digestive organs: Secondary | ICD-10-CM

## 2012-01-23 NOTE — Progress Notes (Signed)
Dr. Welton Flakes requested a consultation for genetic counseling and risk assessment for Mandy Vargas, a 51 y.o. female, for discussion of her personal and family history of breast cancer. She presents to clinic today, with her son Terese Door, to discuss the possibility of a genetic predisposition to cancer, and to further clarify her risks, as well as her family members' risks for cancer.   HISTORY OF PRESENT ILLNESS: In April 2012, at the age of 46, Mandy Vargas was diagnosed with DCIS of the left breast.     Past Medical History  Diagnosis Date  . Irregular heartbeat   . Irregular heart beat   . Wears glasses   . Bruises easily   . Depression   . Anemia   . Breast cancer DCIS    Past Surgical History  Procedure Date  . Dilation and curettage of uterus 1993  . Kidney stone surgery     History  Substance Use Topics  . Smoking status: Never Smoker   . Smokeless tobacco: Not on file  . Alcohol Use: No    REPRODUCTIVE HISTORY AND PERSONAL RISK ASSESSMENT FACTORS: Menarche was at age 4.   Premenopausal Uterus Intact: Yes Ovaries Intact: Yes G2P1A1 , first live birth at age 2  She has not previously undergone treatment for infertility.   OCP use for 10/15 years   She has not used HRT in the past.    FAMILY HISTORY:  We obtained a detailed, 4-generation family history.  Significant diagnoses are listed below: Family History  Problem Relation Age of Onset  . Colon cancer Mother   . Breast cancer Sister   . Colon cancer Paternal Grandmother   The patient was diagnosed with breast cancer at age 80.  Her sister was diagnosed with breast cancer at age 27.  Her mother was diagnosed with colon cancer at age 74 and is currently 66 and doing well.  The patient has three maternal uncles, and two maternal aunts.  One aunt was diagnosed with breast cancer around age 4.  There is no other cancer diagnosis on this side of the family.  The patient's paternal grandmother was diagnosed with  colon cancer in her 80s.  There is no other reported cancer history on this side of the family.  Patient's maternal ancestors are of [place] descent, and paternal ancestors are of [place] and [place] descent. There is no reported Ashkenazi Jewish ancestry. There is no known consanguinity.  GENETIC COUNSELING RISK ASSESSMENT, DISCUSSION, AND SUGGESTED FOLLOW UP: We reviewed the natural history and genetic etiology of sporadic, familial and hereditary cancer syndromes.  About 5-10% of breast cancer is hereditary and of this about 85% is the result of BRCA1 or BRCA2 mutations.  We reviewed the red flags of hereditary cancer and the dominant inheritance pattern.  We reviewed that her grandmother had a more typical occurrence of colon cancer and with no cancer history on this side of the family, it is most likely a sporadic cancer.  Her mother's colon cancer was at an earlier age, and therefore, the patient should continue to get colonoscopy's because of that history.  The patient's personal and family history of breast cancer is suggestive of the following possible diagnosis: hereditary cancer syndrome  We discussed that identification of a hereditary cancer syndrome may help her care providers tailor the patients medical management. If a mutation indicating a hereditary cancer syndrome is detected in this case, the [employer] recommendations would include increased cancer surveillance and  possible prophylactic sugery. If a mutation is detected, the patient will be referred back to the referring provider and to any additional appropriate care providers to discuss the relevant options.   If a mutation is not found in the patient, this will decrease the likelihood of hereditary cancer syndrome as the explanation for her breast cancer. Cancer surveillance options would be discussed for the patient according to the appropriate standard National Comprehensive Cancer Network and American  Cancer Society guidelines, with consideration of their personal and family history risk factors. In this case, the patient will be referred back to their care providers for discussions of management.   In order to estimate her chance of having a BRCA1 or BRCA2 mutation, we used statistical models (Penn II) and laboratory data that take into account her personal medical history, family history and ancestry.  Because each model is different, there can be a lot of variability in the risks they give.  Therefore, these numbers must be considered a rough range and not a precise risk of having a BRCA1 or BRCA2 mutation.  These models estimate that she has approximately a 6% chance of having a mutation. Based on this assessment of her family and personal history, genetic testing is recommended.  After considering the risks, benefits, and limitations, the patient provided informed consent for the following testing: [facility] through [employer] [facility].   Per the patient's request, we will contact her by telephone to discuss these results. A follow up genetic counseling visit will be scheduled if indicated.  The patient was seen for a total of 60 minutes, greater than 50% of which was spent face-to-face counseling.  This plan is being carried out per Dr. Milta Deiters recommendations.  This note will also be sent to the referring provider via the electronic medical record. The patient will be supplied with a summary of this genetic counseling discussion as well as educational information on the discussed hereditary cancer syndromes following the conclusion of their visit.   Patient was discussed with Dr. Drue Second.  EDUCATIONAL INFORMATION SUPPLIED TO PATIENT AT ENCOUNTER:  Hereditary breast and ovarian cancer brochure   _______________________________________________________________________ For Office Staff:  Number of people involved in session: 3 Was an Intern/ student involved with case: no

## 2012-01-25 ENCOUNTER — Encounter: Payer: Self-pay | Admitting: Genetic Counselor

## 2012-02-01 ENCOUNTER — Telehealth: Payer: Self-pay | Admitting: Genetic Counselor

## 2012-02-01 ENCOUNTER — Ambulatory Visit: Payer: BC Managed Care – PPO | Admitting: Oncology

## 2012-02-01 NOTE — Telephone Encounter (Signed)
Revealed negative BRCA and BART test results. 

## 2012-02-06 ENCOUNTER — Other Ambulatory Visit (INDEPENDENT_AMBULATORY_CARE_PROVIDER_SITE_OTHER): Payer: Self-pay | Admitting: Surgery

## 2012-02-06 DIAGNOSIS — D0590 Unspecified type of carcinoma in situ of unspecified breast: Secondary | ICD-10-CM

## 2012-02-09 ENCOUNTER — Encounter (HOSPITAL_COMMUNITY): Payer: Self-pay

## 2012-02-12 ENCOUNTER — Encounter (HOSPITAL_COMMUNITY): Payer: Self-pay

## 2012-02-12 NOTE — Progress Notes (Signed)
Pt. Remarks that she was on birth control pill up until her DCIS diagnosis.  Since off pill in April, has not had period. Will do serum preg.

## 2012-02-12 NOTE — Progress Notes (Signed)
Call to all 3 phone nos. Listed, no ans., left voicemail on mobile no.

## 2012-02-14 ENCOUNTER — Encounter (HOSPITAL_COMMUNITY)
Admission: RE | Admit: 2012-02-14 | Discharge: 2012-02-14 | Disposition: A | Discharge status: Home / Self Care | Payer: BC Managed Care – PPO | Source: Ambulatory Visit | Attending: Surgery | Admitting: Surgery

## 2012-02-14 LAB — BASIC METABOLIC PANEL
BUN: 25 mg/dL — ABNORMAL HIGH (ref 6–23)
CO2: 28 mEq/L (ref 19–32)
Calcium: 9.8 mg/dL (ref 8.4–10.5)
Creatinine, Ser: 0.71 mg/dL (ref 0.50–1.10)
Glucose, Bld: 81 mg/dL (ref 70–99)
Sodium: 136 mEq/L (ref 135–145)

## 2012-02-14 LAB — SURGICAL PCR SCREEN
MRSA, PCR: NEGATIVE
Staphylococcus aureus: NEGATIVE

## 2012-02-14 LAB — CBC
MCH: 29.9 pg (ref 26.0–34.0)
MCHC: 33.2 g/dL (ref 30.0–36.0)
MCV: 90.2 fL (ref 78.0–100.0)
Platelets: 171 10*3/uL (ref 150–400)
RDW: 12.6 % (ref 11.5–15.5)

## 2012-02-14 NOTE — Pre-Procedure Instructions (Deleted)
20 Mandy Vargas  02/14/2012   Your procedure is scheduled on:  02/19/2012  Report to [facility]  directly after Breast Center appt. At 11:30 AM.  Call this number if you have problems the morning of surgery: [phone]   Remember:   Do not eat food:After Midnight.  SUNDAY  May have clear liquids: up to 4 Hours before arrival.  Nothing after 7:30a.m.  Clear liquids include soda, tea, black coffee, apple or grape juice, broth.  Take these medicines the morning of surgery with A SIP OF WATER: Wellbutrin, VALIUM   Do not wear jewelry, make-up or nail polish.  Do not wear lotions, powders, or perfumes. You may wear deodorant.  Do not shave 48 hours prior to surgery. Men may shave face and neck.  Do not bring valuables to the hospital.  Contacts, dentures or bridgework may not be worn into surgery.  Leave suitcase in the car. After surgery it may be brought to your room.  For patients admitted to the hospital, checkout time is 11:00 AM the day of discharge.   Patients discharged the day of surgery will not be allowed to drive home.  Name and phone number of your driver: with SON  Special Instructions: CHG Shower Use Special Wash: 1/2 bottle night before surgery and 1/2 bottle morning of surgery.   Please read over the following fact sheets that you were given: Pain Booklet, Coughing and Deep Breathing, MRSA Information and Surgical Site Infection Prevention

## 2012-02-18 MED ORDER — CEFAZOLIN SODIUM 1-5 GM-% IV SOLN
1.0000 g | INTRAVENOUS | Status: DC
  Filled 2012-02-18: qty 50

## 2012-02-19 ENCOUNTER — Ambulatory Visit
Admission: RE | Admit: 2012-02-19 | Discharge: 2012-02-19 | Disposition: A | Discharge status: Home / Self Care | Payer: BC Managed Care – PPO | Source: Ambulatory Visit | Attending: Surgery | Admitting: Surgery

## 2012-02-19 ENCOUNTER — Encounter (HOSPITAL_COMMUNITY): Payer: Self-pay | Admitting: *Deleted

## 2012-02-19 ENCOUNTER — Encounter (HOSPITAL_COMMUNITY)
Admission: RE | Disposition: A | Discharge status: Home / Self Care | Payer: Self-pay | Source: Ambulatory Visit | Attending: Surgery

## 2012-02-19 ENCOUNTER — Ambulatory Visit (HOSPITAL_COMMUNITY)
Admission: RE | Admit: 2012-02-19 | Discharge: 2012-02-19 | Disposition: A | Discharge status: Home / Self Care | Payer: BC Managed Care – PPO | Source: Ambulatory Visit | Attending: Surgery | Admitting: Surgery

## 2012-02-19 ENCOUNTER — Ambulatory Visit (HOSPITAL_COMMUNITY): Payer: BC Managed Care – PPO | Admitting: Anesthesiology

## 2012-02-19 ENCOUNTER — Encounter (HOSPITAL_COMMUNITY): Payer: Self-pay | Admitting: Anesthesiology

## 2012-02-19 DIAGNOSIS — F3289 Other specified depressive episodes: Secondary | ICD-10-CM | POA: Insufficient documentation

## 2012-02-19 DIAGNOSIS — Z01810 Encounter for preprocedural cardiovascular examination: Secondary | ICD-10-CM | POA: Insufficient documentation

## 2012-02-19 DIAGNOSIS — D0590 Unspecified type of carcinoma in situ of unspecified breast: Secondary | ICD-10-CM

## 2012-02-19 DIAGNOSIS — F329 Major depressive disorder, single episode, unspecified: Secondary | ICD-10-CM | POA: Insufficient documentation

## 2012-02-19 DIAGNOSIS — Z170 Estrogen receptor positive status [ER+]: Secondary | ICD-10-CM | POA: Insufficient documentation

## 2012-02-19 DIAGNOSIS — Z01812 Encounter for preprocedural laboratory examination: Secondary | ICD-10-CM | POA: Insufficient documentation

## 2012-02-19 DIAGNOSIS — D0510 Intraductal carcinoma in situ of unspecified breast: Secondary | ICD-10-CM

## 2012-02-19 HISTORY — PX: BREAST LUMPECTOMY: SHX2

## 2012-02-19 SURGERY — BREAST LUMPECTOMY WITH NEEDLE LOCALIZATION
Anesthesia: General | Site: Breast | Laterality: Left | Wound class: Clean

## 2012-02-19 MED ORDER — HYDROMORPHONE HCL PF 1 MG/ML IJ SOLN: 0.2500 mg | INTRAMUSCULAR | Status: DC | PRN

## 2012-02-19 MED ORDER — MIDAZOLAM HCL 5 MG/5ML IJ SOLN
INTRAMUSCULAR | Status: DC | PRN
  Administered 2012-02-19: 2 mg via INTRAVENOUS

## 2012-02-19 MED ORDER — BUPIVACAINE HCL (PF) 0.25 % IJ SOLN
INTRAMUSCULAR | Status: DC | PRN
  Administered 2012-02-19: 30 mL

## 2012-02-19 MED ORDER — PHENYLEPHRINE HCL 10 MG/ML IJ SOLN
INTRAMUSCULAR | Status: DC | PRN
  Administered 2012-02-19: 120 ug via INTRAVENOUS
  Administered 2012-02-19: 80 ug via INTRAVENOUS

## 2012-02-19 MED ORDER — CHLORHEXIDINE GLUCONATE 4 % EX LIQD: 1.0000 "application " | Freq: Once | CUTANEOUS | Status: DC

## 2012-02-19 MED ORDER — FENTANYL CITRATE 0.05 MG/ML IJ SOLN
INTRAMUSCULAR | Status: DC | PRN
  Administered 2012-02-19 (×2): 100 ug via INTRAVENOUS

## 2012-02-19 MED ORDER — DEXAMETHASONE SODIUM PHOSPHATE 10 MG/ML IJ SOLN
INTRAMUSCULAR | Status: DC | PRN
  Administered 2012-02-19: 10 mg via INTRAVENOUS

## 2012-02-19 MED ORDER — HYDROCODONE-ACETAMINOPHEN 5-325 MG PO TABS: 1.0000 | ORAL_TABLET | Freq: Four times a day (QID) | ORAL | Status: DC | PRN

## 2012-02-19 MED ORDER — ONDANSETRON HCL 4 MG/2ML IJ SOLN
INTRAMUSCULAR | Status: DC | PRN
  Administered 2012-02-19: 4 mg via INTRAVENOUS

## 2012-02-19 MED ORDER — LACTATED RINGERS IV SOLN
INTRAVENOUS | Status: DC | PRN
  Administered 2012-02-19 (×2): via INTRAVENOUS

## 2012-02-19 MED ORDER — 0.9 % SODIUM CHLORIDE (POUR BTL) OPTIME
TOPICAL | Status: DC | PRN
  Administered 2012-02-19: 1000 mL

## 2012-02-19 SURGICAL SUPPLY — 50 items
ADH SKN CLS APL DERMABOND .7 (GAUZE/BANDAGES/DRESSINGS) ×1
APL SKNCLS STERI-STRIP NONHPOA (GAUZE/BANDAGES/DRESSINGS)
ATCH SMKEVC FLXB CAUT HNDSWH (FILTER) ×1 IMPLANT
BENZOIN TINCTURE PRP APPL 2/3 (GAUZE/BANDAGES/DRESSINGS) IMPLANT
BINDER BREAST LRG (GAUZE/BANDAGES/DRESSINGS) ×1 IMPLANT
BINDER BREAST XLRG (GAUZE/BANDAGES/DRESSINGS) IMPLANT
BLADE SURG 10 STRL SS (BLADE) ×2 IMPLANT
BLADE SURG 15 STRL LF DISP TIS (BLADE) ×1 IMPLANT
BLADE SURG 15 STRL SS (BLADE) ×2
CANISTER SUCTION 2500CC (MISCELLANEOUS) ×2 IMPLANT
CHLORAPREP W/TINT 26ML (MISCELLANEOUS) ×2 IMPLANT
CLIP TI WIDE RED SMALL 6 (CLIP) ×1 IMPLANT
CLOTH BEACON ORANGE TIMEOUT ST (SAFETY) ×2 IMPLANT
COVER SURGICAL LIGHT HANDLE (MISCELLANEOUS) ×2 IMPLANT
DERMABOND ADVANCED (GAUZE/BANDAGES/DRESSINGS) ×1
DERMABOND ADVANCED .7 DNX12 (GAUZE/BANDAGES/DRESSINGS) IMPLANT
DEVICE DUBIN SPECIMEN MAMMOGRA (MISCELLANEOUS) ×2 IMPLANT
DRAPE CHEST BREAST 15X10 FENES (DRAPES) ×2 IMPLANT
DRAPE UTILITY 15X26 W/TAPE STR (DRAPE) ×4 IMPLANT
ELECT COATED BLADE 2.86 ST (ELECTRODE) ×2 IMPLANT
ELECT REM PT RETURN 9FT ADLT (ELECTROSURGICAL) ×2
ELECTRODE REM PT RTRN 9FT ADLT (ELECTROSURGICAL) ×1 IMPLANT
EVACUATOR SMOKE ACCUVAC VALLEY (FILTER) ×1
GLOVE BIOGEL PI IND STRL 7.5 (GLOVE) IMPLANT
GLOVE BIOGEL PI INDICATOR 7.5 (GLOVE) ×1
GLOVE SURG SIGNA 7.5 PF LTX (GLOVE) ×2 IMPLANT
GOWN STRL NON-REIN LRG LVL3 (GOWN DISPOSABLE) ×2 IMPLANT
GOWN STRL REIN XL XLG (GOWN DISPOSABLE) ×2 IMPLANT
KIT BASIN OR (CUSTOM PROCEDURE TRAY) ×2 IMPLANT
KIT MARKER MARGIN INK (KITS) ×1 IMPLANT
KIT ROOM TURNOVER OR (KITS) ×2 IMPLANT
NDL HYPO 25GX1X1/2 BEV (NEEDLE) ×1 IMPLANT
NEEDLE HYPO 25GX1X1/2 BEV (NEEDLE) ×2 IMPLANT
NS IRRIG 1000ML POUR BTL (IV SOLUTION) ×2 IMPLANT
PACK SURGICAL SETUP 50X90 (CUSTOM PROCEDURE TRAY) ×2 IMPLANT
PAD ARMBOARD 7.5X6 YLW CONV (MISCELLANEOUS) ×2 IMPLANT
PENCIL BUTTON HOLSTER BLD 10FT (ELECTRODE) ×2 IMPLANT
SLEEVE SURGEON STRL (DRAPES) ×1 IMPLANT
SPONGE GAUZE 4X4 12PLY (GAUZE/BANDAGES/DRESSINGS) ×2 IMPLANT
SPONGE LAP 4X18 X RAY DECT (DISPOSABLE) ×2 IMPLANT
STRIP CLOSURE SKIN 1/4X4 (GAUZE/BANDAGES/DRESSINGS) ×2 IMPLANT
SUT VIC AB 3-0 SH 18 (SUTURE) ×2 IMPLANT
SUT VIC AB 5-0 PS2 18 (SUTURE) ×2 IMPLANT
SYR BULB 3OZ (MISCELLANEOUS) ×2 IMPLANT
SYR CONTROL 10ML LL (SYRINGE) ×2 IMPLANT
TAPE CLOTH SURG 4X10 WHT LF (GAUZE/BANDAGES/DRESSINGS) ×1 IMPLANT
TOWEL OR 17X24 6PK STRL BLUE (TOWEL DISPOSABLE) ×1 IMPLANT
TOWEL OR 17X26 10 PK STRL BLUE (TOWEL DISPOSABLE) ×2 IMPLANT
TUBE CONNECTING 12X1/4 (SUCTIONS) ×4 IMPLANT
YANKAUER SUCT BULB TIP NO VENT (SUCTIONS) ×2 IMPLANT

## 2012-02-19 NOTE — Op Note (Signed)
[name], [name] NO.:  [id number]  MEDICAL RECORD NO.:  [id number]  LOCATION:  [facility]                         FACILITY:  [facility]  PHYSICIAN:  Sandria Bales. Ezzard Standing, M.D.  DATE OF BIRTH:  01/07/1961  DATE OF PROCEDURE:  02/19/2012                               OPERATIVE REPORT   PREOPERATIVE DIAGNOSIS:  Ductal carcinoma in situ of the left breast.  POSTOPERATIVE DIAGNOSIS:  Left breast cancer, ductal carcinoma in situ (Tis, N0), 2 o'clock position.  PROCEDURE:  Left breast lumpectomy with needle localization.  SURGEON:  Sandria Bales. Ezzard Standing, MD.  ASSISTANT:  No first assistant.  ANESTHESIA:  General endotracheal, supervised by Dr. Marguerita Merles. Local used with 30 mL of 0.25% Marcaine.  ESTIMATED BLOOD LOSS:  Minimal.  INDICATIONS FOR PROCEDURE:  Mandy Vargas is a 51 year old white female, who sees Dr. Leodis Sias at [facility] and presented to the breast multidisciplinary clinic with ductal carcinoma in situ of the left breast.    I discussed with her the options for treatment and thought she was a candidate for a lumpectomy alone.  I discussed with her the indications, potential complications of surgery.  Potential complications of surgery include, but are not limited to, bleeding, infection, the need for further surgery, and possible nerve injury.  OPERATIVE NOTE:  The patient [facility] Saintclair Halsted and then presented to the main [facility].  She was taken to room #2 where she underwent a general endotracheal anesthesia, supervised by Dr. Marguerita Merles.  A time-out was held and surgical checklist run.  Her left breast was prepped with ChloraPrep and sterilely draped.  She had a wire coming out of her left breast at 2 o'clock position.  The wire went medially about 9 cm with the clip at about 6 cm.  I made an incision over this wire and took out a block of breast tissue about 5 x 6 cm.   I went down to the chest wall.  The clip was actually more lateral I thought, was probably between 1 and 2 o'clock position  The clip was seen on the specimen mammogram, and the clip was in the lateral side of the specimen.  The lumpectomy went to the chest wall.  I thought I got well around the clip for adequate margins per specimen mammogram. The lumpectomy also involved removing the axillary tail of the breast.   I irrigated the wound with saline.  I placed the baby clips outlined at 12 o'clock, 3 o'clock, 6 o'clock, and 9 o'clock position of the biopsy cavity and 2 on the chest wall.  Then, I infiltrated 30 mL of 0.25% Marcaine into the chest wall and into the wound.  I then irrigated the wound again and closed in layers with 3-0 Vicryl sutures to the deep layers, a 5-0 Vicryl suture for the skin and then Dermabond on the skin.  She was then dressed with 4x4s, and her breasts wrapped.  She tolerated the procedure well, was transported to recovery room in good condition.  Sponge and needle count were correct at the end of case.  Sandria Bales. Ezzard Standing, M.D., FACS   DHN/MEDQ  D:  02/19/2012  T:  02/19/2012  Job:  [id number]  cc:   Thelma Barge P. Modesto Charon, M.D. Drue Second, M.D. Lurline Hare, M.D.

## 2012-02-19 NOTE — Discharge Instructions (Signed)
[facility] - DISCHARGE INSTRUCTIONS TO PATIENT  Activity:  Driving - May drive tomorrow, if doing well.   Lifting - No limit after 3 days.  Limit lifting to less than 15 pounds for 3 days.  Wound Care:   Leave wound covered for 2 days.  Then may remove and take shower.  Diet:  As tolerated  Follow up appointment:  Call Dr. Allene Pyo office [facility]) at [phone] for an appointment in 7 to 14 days.  Medications and dosages:  Resume your home medications.  You have a prescription for:  Vicodin.  Call Dr. Ezzard Standing or his office  [phone]) if you have:  Temperature greater than 100.4,  Persistent nausea and vomiting,  Severe uncontrolled pain,  Redness, tenderness, or signs of infection (pain, swelling, redness, odor or green/yellow discharge around the site),  Difficulty breathing, headache or visual disturbances,  Any other questions or concerns you may have after discharge.  In an emergency, call 911 or go to an Emergency Department at a nearby hospital.    Instructions Following General Anesthetic, Adult A nurse specialized in giving anesthesia (anesthetist) or a doctor specialized in giving anesthesia (anesthesiologist) gave you a medicine that made you sleep while a procedure was performed. For as long as 24 hours following this procedure, you may feel:  Dizzy.   Weak.   Drowsy.  AFTER THE PROCEDURE After surgery, you will be taken to the recovery area where a nurse will monitor your progress. You will be allowed to go home when you are awake, stable, taking fluids well, and without complications. For the first 24 hours following an anesthetic:  Have a responsible person with you.   Do not drive a car. If you are alone, do not take public transportation.   Do not drink alcohol.   Do not take medicine that has not been prescribed by your caregiver.   Do not sign important papers or make important decisions.   You may resume normal  diet and activities as directed.   Change bandages (dressings) as directed.   Only take over-the-counter or prescription medicines for pain, discomfort, or fever as directed by your caregiver.  If you have questions or problems that seem related to the anesthetic, call the hospital and ask for the anesthetist or anesthesiologist on call. SEEK IMMEDIATE MEDICAL CARE IF:   You develop a rash.   You have difficulty breathing.   You have chest pain.   You develop any allergic problems.  Document Released: 12/04/2000 Document Revised: 08/17/2011 Document Reviewed: 07/15/2007 [facility] Patient Information 2012 [place], [place].

## 2012-02-19 NOTE — H&P (Signed)
Re: Mandy Vargas  DOB: 03-07-1961  MRN: [id number]   [facility]   ASSESSMENT AND PLAN:  1. 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 Will need wire localization. Tumor is located in the upper central portion of left breast on MRI.   I discussed the options for breast cancer treatment with the patient. I discussed the idea of a multidisciplinary approach to the treatment of breast cancer, which often includes medical oncology and radiation oncology. I discussed the surgical options of lumpectomy vs. mastectomy. I discussed the options of lymph node biopsy.   The risks of surgery include, but are not limited to, bleeding, infection, the need for further surgery, and nerve injury.   Seeing Drs. Park Breed and Michell Heinrich.   For lumpectomy today.  Has had wire localization.  Her son is here with today.  2. Irregular heart rate/palpitations, on Inderal.  3. Depression.   Has seen Dr. Lafayette Dragon once.  4. History of kidney stones.  5. For genetic testing first, before posting surgery, since it would dramatically change the planned surgery.    [Genetic testing was negative. DN 02/02/2012]   REFERRING PHYSICIAN: Dr. Orvan July, [facility]  HISTORY OF PRESENT ILLNESS:  Mandy Vargas is a 51 y.o. (DOB: Dec 16, 1960) white female whose primary care physician is Dr. Orvan July, [facility], and came through the [facility] for a left breast cancer.   [facility] said [facility] had [facility] following some microcalcifications. Because of changes in the microcalcifications, she had a biopsy 12/22/2011 which showed DCIS.  She is still having periods. She is on BCP, which she will stop. She has had no prior breast biopsy. She has a sister, Etheleen Sia, who had breast cancer treated by Dr. Cindee Lame in 2008.   Past Medical History   Diagnosis  Date   .  Irregular heartbeat    .  Irregular heart beat    .  Wears glasses    .  Bruises easily    .  Depression    .  Anemia    .  Breast cancer   DCIS    Past Surgical History   Procedure  Date   .  Dilation and curettage of uterus  1993   .  Kidney stone surgery     Current Outpatient Prescriptions   Medication  Sig  Dispense  Refill   .  buPROPion (WELLBUTRIN) 100 MG tablet  Take 100 mg by mouth 2 (two) times daily.     .  citalopram (CELEXA) 40 MG tablet  Take 40 mg by mouth daily.     .  clonazePAM (KLONOPIN) 1 MG tablet  Take 1 mg by mouth 2 (two) times daily as needed.     .  desogestrel-ethinyl estradiol (KARIVA,AZURETTE,MIRCETTE) 0.15-0.02/0.01 MG (21/5) tablet  Take 1 tablet by mouth daily.     .  Multiple Vitamin (MULTIVITAMIN) tablet  Take 1 tablet by mouth daily.     .  propranolol (INDERAL) 60 MG tablet  Take 60 mg by mouth daily at 8 pm.     No Known Allergies   REVIEW OF SYSTEMS:  Skin: No history of rash. No history of abnormal moles.  Infection: No history of hepatitis or HIV. No history of MRSA.  Neurologic: No history of stroke. No history of seizure. No history of headaches.  Cardiac: Irregular heart rate/palpitations. Does not see a cardiologist.  Pulmonary: Does not  smoke cigarettes. No asthma or bronchitis.  Endocrine: No diabetes. No thyroid disease.  Gastrointestinal: No history of stomach disease. No history of liver disease. No history of gall bladder disease. No history of pancreas disease. No history of colon disease.  GYN: Has seen Dr. Charma Igo in the past.  Urologic: No history of kidney stones. No history of bladder infections.  Musculoskeletal: No history of joint or back disease.  Hematologic: No bleeding disorder. No history of anemia.  Psycho-social: The patient is oriented. History of depression.   SOCIAL and FAMILY HISTORY:  Married, though husband not with patient. He is a Visual merchandiser.  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  One son - 69.   PHYSICAL EXAM:  BP 112/73  Pulse 66  Temp(Src) 98.9 F (37.2 C) (Oral)  Resp 18  SpO2 97%  LMP  12/13/2011  General: WN WF who is alert and generally healthy appearing.  HEENT: Normal. Pupils equal. Good dentition.  Neck: Supple. No mass. No thyroid mass. Carotid pulse okay with no bruit.  Lymph Nodes: No supraclavicular or cervical nodes.  Lungs: Clear to auscultation and symmetric breath sounds.  Heart: RRR. No murmur or rub.  Breasts: Left: Scar at 4 o'clock position in left breast.   Right: No abnormality.   Abdomen: Soft. No mass. No tenderness. No hernia. Normal bowel sounds. No abdominal scars.  Rectal: Not done.  Extremities: Good strength and ROM in upper and lower extremities.  Neurologic: Grossly intact to motor and sensory function.  Psychiatric: Has normal mood and affect. Behavior is normal.   DATA REVIEWED:  Path report and mammograms.  Ovidio Kin, MD, [facility]  [address] [place]  [place], [place] [address]  Phone: [phone] FAX: [phone]

## 2012-02-19 NOTE — Brief Op Note (Signed)
02/19/2012  3:11 PM  PATIENT:  Mandy Vargas, 51 y.o., female, MRN: [id number]  PREOP DIAGNOSIS:  Left breast cancer  POSTOP DIAGNOSIS:   Left breast cancer, DCIS (Tis, N0), 2 o'clock position  PROCEDURE:   Procedure(s): left BREAST LUMPECTOMY WITH NEEDLE LOCALIZATION  SURGEON:   Ovidio Kin, M.D.  ASSISTANT:   none  ANESTHESIA:   general  W. Autumn Patty, MD - Anesthesiologist Coralee Rud, CRNA - CRNA  General  EBL:  minimal  ml  BLOOD ADMINISTERED: none  DRAINS: none   LOCAL MEDICATIONS USED:   30 cc 1/4% marcaine  SPECIMEN:   Left breast lumpectomy  COUNTS CORRECT:  YES  INDICATIONS FOR PROCEDURE:  YONEKO TALERICO is a 51 y.o. (DOB: [birth year]) white female whose primary care physician is Provider Not In System and comes for left breast lumpectomy   The indications and risks of the surgery were explained to the patient.  The risks include, but are not limited to, infection, bleeding, and nerve injury.  Note dictated to:   #[id number]

## 2012-02-19 NOTE — Anesthesia Postprocedure Evaluation (Signed)
  Anesthesia Post-op Note  Patient: Mandy Vargas  Procedure(s) Performed: Procedure(s) (LRB): BREAST LUMPECTOMY WITH NEEDLE LOCALIZATION (Left)  Patient Location: PACU  Anesthesia Type: General  Level of Consciousness: awake  Airway and Oxygen Therapy: Patient Spontanous Breathing and Patient connected to nasal cannula oxygen  Post-op Pain: none  Post-op Assessment: Post-op Vital signs reviewed, Patient's Cardiovascular Status Stable, Respiratory Function Stable, Patent Airway and No signs of Nausea or vomiting  Post-op Vital Signs: Reviewed and stable  Complications: No apparent anesthesia complications

## 2012-02-19 NOTE — Anesthesia Preprocedure Evaluation (Signed)
Anesthesia Evaluation  Patient identified by MRN, date of birth, ID band Patient awake    Reviewed: Allergy & Precautions, H&P , NPO status , Patient's Chart, lab work & pertinent test results  History of Anesthesia Complications Negative for: history of anesthetic complications  Airway Mallampati: I      Dental  (+) Teeth Intact   Pulmonary neg pulmonary ROS,    Pulmonary exam normal       Cardiovascular + dysrhythmias Rate:Normal  Palpitations (hx of)  Inderal controls well   Neuro/Psych negative neurological ROS     GI/Hepatic negative GI ROS, Neg liver ROS,   Endo/Other    Renal/GU negative Renal ROS     Musculoskeletal negative musculoskeletal ROS (+)   Abdominal   Peds  Hematology negative hematology ROS (+)   Anesthesia Other Findings   Reproductive/Obstetrics negative OB ROS                           Anesthesia Physical Anesthesia Plan  ASA: II  Anesthesia Plan: General   Post-op Pain Management:    Induction: Intravenous  Airway Management Planned: LMA  Additional Equipment:   Intra-op Plan:   Post-operative Plan: Extubation in OR  Informed Consent: I have reviewed the patients History and Physical, chart, labs and discussed the procedure including the risks, benefits and alternatives for the proposed anesthesia with the patient or authorized representative who has indicated his/her understanding and acceptance.   Dental advisory given  Plan Discussed with: CRNA  Anesthesia Plan Comments:         Anesthesia Quick Evaluation

## 2012-02-19 NOTE — Transfer of Care (Signed)
Immediate Anesthesia Transfer of Care Note  Patient: Mandy Vargas  Procedure(s) Performed: Procedure(s) (LRB): BREAST LUMPECTOMY WITH NEEDLE LOCALIZATION (Left)  Patient Location: PACU  Anesthesia Type: General  Level of Consciousness: awake and alert   Airway & Oxygen Therapy: Patient Spontanous Breathing and Patient connected to nasal cannula oxygen  Post-op Assessment: Report given to PACU RN, Post -op Vital signs reviewed and stable, Patient moving all extremities and Patient moving all extremities X 4  Post vital signs: Reviewed and stable  Complications: No apparent anesthesia complications

## 2012-02-21 ENCOUNTER — Telehealth (INDEPENDENT_AMBULATORY_CARE_PROVIDER_SITE_OTHER): Payer: Self-pay | Admitting: General Surgery

## 2012-02-21 NOTE — Telephone Encounter (Signed)
Pt calling after removing bandages to bathe today. She had general questions about steri-strips, wearing bra vs cami, and increasing level of activity.  Questions answered and emotional support given.  Needs follow-up appt

## 2012-02-21 NOTE — Telephone Encounter (Signed)
I called and gave the pt an appt for 6/21

## 2012-02-29 ENCOUNTER — Ambulatory Visit (HOSPITAL_BASED_OUTPATIENT_CLINIC_OR_DEPARTMENT_OTHER): Payer: BC Managed Care – PPO | Admitting: Oncology

## 2012-02-29 ENCOUNTER — Encounter: Payer: Self-pay | Admitting: *Deleted

## 2012-02-29 ENCOUNTER — Telehealth: Payer: Self-pay | Admitting: Oncology

## 2012-02-29 ENCOUNTER — Encounter: Payer: Self-pay | Admitting: Oncology

## 2012-02-29 DIAGNOSIS — Z170 Estrogen receptor positive status [ER+]: Secondary | ICD-10-CM

## 2012-02-29 DIAGNOSIS — D0590 Unspecified type of carcinoma in situ of unspecified breast: Secondary | ICD-10-CM

## 2012-02-29 NOTE — Telephone Encounter (Signed)
lmonvm adviisng the pt of her r/s July appt per ftka pt missed her June appt

## 2012-02-29 NOTE — Patient Instructions (Addendum)
1. Doing well post-operative.   2. You will receive radiation therapy first. After completion radiation we will begin anti-estrogen (tamoxifen)  3. I will see you back at the end of August  4. Below is the information on tamoxifen:  Tamoxifen oral tablet What is this medicine? TAMOXIFEN (ta MOX i fen) blocks the effects of estrogen. It is commonly used to treat breast cancer. It is also used to decrease the chance of breast cancer coming back in women who have received treatment for the disease. It may also help prevent breast cancer in women who have a high risk of developing breast cancer. This medicine may be used for other purposes; ask your health care provider or pharmacist if you have questions. What should I tell my health care provider before I take this medicine? They need to know if you have any of these conditions: -blood clots -blood disease -cataracts or impaired eyesight -endometriosis -high calcium levels -high cholesterol -irregular menstrual cycles -liver disease -stroke -uterine fibroids -an unusual or allergic reaction to tamoxifen, other medicines, foods, dyes, or preservatives -pregnant or trying to get pregnant -breast-feeding How should I use this medicine? Take this medicine by mouth with a glass of water. Follow the directions on the prescription label. You can take it with or without food. Take your medicine at regular intervals. Do not take your medicine more often than directed. Do not stop taking except on your doctor's advice. A special MedGuide will be given to you by the pharmacist with each prescription and refill. Be sure to read this information carefully each time. Talk to your pediatrician regarding the use of this medicine in children. While this drug may be prescribed for selected conditions, precautions do apply. Overdosage: If you think you have taken too much of this medicine contact a poison control center or emergency room at once. NOTE:  This medicine is only for you. Do not share this medicine with others. What if I miss a dose? If you miss a dose, take it as soon as you can. If it is almost time for your next dose, take only that dose. Do not take double or extra doses. What may interact with this medicine? -aminoglutethimide -bromocriptine -chemotherapy drugs -female hormones, like estrogens and birth control pills -letrozole -medroxyprogesterone -phenobarbital -rifampin -warfarin This list may not describe all possible interactions. Give your health care provider a list of all the medicines, herbs, non-prescription drugs, or dietary supplements you use. Also tell them if you smoke, drink alcohol, or use illegal drugs. Some items may interact with your medicine. What should I watch for while using this medicine? Visit your doctor or health care professional for regular checks on your progress. You will need regular pelvic exams, breast exams, and mammograms. If you are taking this medicine to reduce your risk of getting breast cancer, you should know that this medicine does not prevent all types of breast cancer. If breast cancer or other problems occur, there is no guarantee that it will be found at an early stage. Do not become pregnant while taking this medicine or for 2 months after stopping this medicine. Stop taking this medicine if you get pregnant or think you are pregnant and contact your doctor. This medicine may harm your unborn baby. Women who can possibly become pregnant should use birth control methods that do not use hormones during tamoxifen treatment and for 2 months after therapy has stopped. Talk with your health care provider for birth control advice. Do not breast  feed while taking this medicine. What side effects may I notice from receiving this medicine? Side effects that you should report to your doctor or health care professional as soon as possible: -changes in vision (blurred vision) -changes in  your menstrual cycle -difficulty breathing or shortness of breath -difficulty walking or talking -new breast lumps -numbness -pelvic pain or pressure -redness, blistering, peeling or loosening of the skin, including inside the mouth -skin rash or itching (hives) -sudden chest pain -swelling of lips, face, or tongue -swelling, pain or tenderness in your calf or leg -unusual bruising or bleeding -vaginal discharge that is bloody, brown, or rust -weakness -yellowing of the whites of the eyes or skin Side effects that usually do not require medical attention (report to your doctor or health care professional if they continue or are bothersome): -fatigue -hair loss, although uncommon and is usually mild -headache -hot flashes -impotence (in men) -nausea, vomiting (mild) -vaginal discharge (white or clear) This list may not describe all possible side effects. Call your doctor for medical advice about side effects. You may report side effects to FDA at 1-800-FDA-1088. Where should I keep my medicine? Keep out of the reach of children. Store at room temperature between 20 and 25 degrees C (68 and 77 degrees F). Protect from light. Keep container tightly closed. Throw away any unused medicine after the expiration date. NOTE: This sheet is a summary. It may not cover all possible information. If you have questions about this medicine, talk to your doctor, pharmacist, or health care provider.  2012, [employer]. (05/14/2008 12:01:56 PM)

## 2012-02-29 NOTE — Telephone Encounter (Signed)
gve the pt her sept 2013 appt calendar °

## 2012-02-29 NOTE — Progress Notes (Unsigned)
1627-Call from Deana in registration. Pt here to check in. Per pt she did not realize she was suppose to check in & have a pager.  Pt has been in waiting room since she arrived at approximately 3pm. Reviewed with MD who advised to bring pt back for appt.

## 2012-02-29 NOTE — Progress Notes (Signed)
OFFICE PROGRESS NOTE  CC Dr. Ovidio Kin Dr. 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  #2 patient will proceed initially with radiation therapy and she would like to have this in [place].graph   #3 patient had genetic testing done that was negative for BRCA 2 and 1 gene mutations.  CURRENT THERAPY:patient will proceed with radiation therapy  INTERVAL HISTORY: Mandy Vargas 51 y.o. female returns fora low visit post lumpectomy. Overall she tolerated the procedure well without any significant problems she today denies any fevers chills night sweats headaches she has no shortness of breath no chest pains or palpitations no myalgias or arthralgias her surgical incision site is healing well. Of note patient had genetic testing performed that was negative for both BRCA1 and BRCA2 gene mutation.remainder of the 10 point review of systems is negative.  MEDICAL HISTORY: Past Medical History  Diagnosis Date  . Irregular heartbeat     PCP- managed by F. Wong-Rx inderal   . Irregular heart beat   . Wears glasses   . Bruises easily   . Depression   . Dysrhythmia     palpitations - irreg. heartbeats- takes Inderal   . Anemia     history of  . H/O renal calculi     lithotripsy x1  . Breast cancer DCIS    left     ALLERGIES:   has no known allergies.  MEDICATIONS:  Current Outpatient Prescriptions  Medication Sig Dispense Refill  . buPROPion (WELLBUTRIN XL) 300 MG 24 hr tablet Take 300 mg by mouth daily after breakfast.       . citalopram (CELEXA) 40 MG tablet Take 40 mg by mouth at bedtime.       . clonazePAM (KLONOPIN) 1 MG tablet Take 0.5  mg by mouth at bedtime as needed. For anxiety      . diazepam (VALIUM) 2 MG tablet Take 2 mg by mouth as needed. For pain      . HYDROcodone-acetaminophen (NORCO) 5-325 MG per tablet Take 1-2 tablets by mouth every 6 (six) hours as needed for pain.  30 tablet  1  . Multiple Vitamin (MULTIVITAMIN) tablet Take 1 tablet by mouth daily.      . propranolol (INDERAL) 60 MG tablet Take 60 mg by mouth daily at 8 pm.        SURGICAL HISTORY:  Past Surgical History  Procedure Date  . Dilation and curettage of uterus 1993  . Kidney stone surgery     REVIEW OF SYSTEMS:  Pertinent items are noted in HPI.   PHYSICAL EXAMINATION: General appearance: alert, cooperative and appears stated age Neck: no adenopathy, no carotid bruit, no JVD, supple, symmetrical, trachea midline and thyroid not enlarged, symmetric, no tenderness/mass/nodules Lymph nodes: Cervical, supraclavicular, and axillary nodes normal. Resp: clear to auscultation bilaterally and normal percussion bilaterally Back: symmetric, no curvature. ROM normal. No CVA tenderness. Cardio: regular rate and rhythm, S1, S2 normal, no murmur, click, rub or gallop GI: soft, non-tender; bowel sounds normal; no masses,  no organomegaly Extremities: extremities normal, atraumatic, no cyanosis or edema Neurologic: Grossly normal Bilateral breast examination left  breast reveals a healed incisional scar no masses or nipple discharge. Right breast no masses or nipple discharge.  ECOG PERFORMANCE STATUS: 0 - Asymptomatic  Last menstrual period 12/13/2011.  LABORATORY DATA: Lab Results  Component Value Date   WBC 5.1 02/14/2012   HGB 12.5 02/14/2012   HCT 37.7 02/14/2012   MCV 90.2 02/14/2012   PLT 171 02/14/2012      Chemistry      Component Value Date/Time   NA 136 02/14/2012 1141   K 4.6 02/14/2012 1141   CL 103 02/14/2012 1141   CO2 28 02/14/2012 1141   BUN 25* 02/14/2012 1141   CREATININE 0.71 02/14/2012 1141      Component Value Date/Time   CALCIUM 9.8  02/14/2012 1141   ALKPHOS 54 01/03/2012 0801   AST 19 01/03/2012 0801   ALT 14 01/03/2012 0801   BILITOT 0.3 01/03/2012 0801      FINAL DIAGNOSIS Diagnosis Breast, lumpectomy, Left - DUCTAL CARCINOMA IN SITU, SEE COMMENT. - IN SITU CARCINOMA IS 0.4 CM FROM THE NEAREST MARGIN (POSTERIOR). - SEE TUMOR SYNOPTIC TEMPLATE BELOW. Microscopic Comment BREAST, IN SITU CARCINOMA Specimen, including laterality: Left breast. Procedure: Lumpectomy. Grade of carcinoma: II. Necrosis: Absent. Estimated tumor size: (glass slide measurement): 0.3 cm. Treatment effect: None. If present, treatment effect in breast tissue, lymph nodes or both: N/A. Distance to closest margin: 0.4 cm. If margin positive, focally or broadly: N/A. Breast prognostic profile: Not repeated. Estrogen receptor: Previous study demonstrated 100% positivity ([id number]). Progesterone receptor: Previous study demonstrated 100% positivity ([id number]). Lymph nodes: Examined: 0. Lymph nodes with metastasis: N/A. TNM: pTis, pNX. Comments: Additional findings include: previous biopsy site, fibrocystic change, micro-calcifications, and diminutive radial scar. (CRR:eps 02/21/12) [place] RUND DO Pathologist, Electronic Signature (Case signed 02/21/2012) Specimen Gross and Clinical Information 1 of 2 RADIOGRAPHIC STUDIES:  Mm Breast Surgical Specimen  02/19/2012  *RADIOLOGY REPORT*  Clinical Data:  Left breast carcinoma.  NEEDLE LOCALIZATION WITH MAMMOGRAPHIC GUIDANCE AND SPECIMEN RADIOGRAPH  Patient presents for needle localization prior to surgical excision of left breast carcinoma.  I met with the patient and we discussed the procedure of needle localization including benefits and alternatives. We discussed the high likelihood of a successful procedure. We discussed the risks of the procedure, including infection, bleeding, tissue injury, and further surgery. Informed, written consent was given.  Using mammographic guidance, sterile  technique, 2% lidocaine and a 7 cm modified Kopans needle, the clip was localized using a lateral approach.  The films are marked for Dr. Ezzard Standing.  Specimen radiograph was performed at day surgery, and confirms the clip and intact wire with residual calcifications to be present in the tissue sample.  The specimen is marked for pathology.  IMPRESSION: Needle localization of the left breast.  No apparent complications.  Original Report Authenticated By: Rolla Plate, M.D.   Mm Breast Wire Localization Left  02/19/2012  *RADIOLOGY REPORT*  Clinical Data:  Left breast carcinoma.  NEEDLE LOCALIZATION WITH MAMMOGRAPHIC GUIDANCE AND SPECIMEN RADIOGRAPH  Patient presents for needle localization prior to surgical excision of left breast carcinoma.  I met with the patient and we discussed the procedure of needle localization including benefits and alternatives. We discussed the high likelihood of a successful procedure. We discussed the risks of the procedure, including infection, bleeding, tissue injury, and further surgery. Informed, written consent was given.  Using mammographic guidance, sterile technique, 2% lidocaine and a 7 cm modified Kopans needle, the clip was localized using a lateral approach.  The films are marked  for Dr. Ezzard Standing.  Specimen radiograph was performed at day surgery, and confirms the clip and intact wire with residual calcifications to be present in the tissue sample.  The specimen is marked for pathology.  IMPRESSION: Needle localization of the left breast.  No apparent complications.  Original Report Authenticated By: Rolla Plate, M.D.    ASSESSMENT: 51 year old female with stage 0 (DCIS measuring 0.3 cm) tumor was ER/PR positive. Patient is now status post lumpectomy. Overall she tolerated the surgery well.  Recommendation is for patient to proceed with her radiation therapy. Once she completes this then she will go onto chemoprevention with tamoxifen 20 mg daily. This was clearly  discussed with the patient in detail.   PLAN: will have her radiation performed in [place]. Once she completes this then I will plan on seeing her back and we will start her on tamoxifen. I will go ahead and set her up to see me back in about 2-2-1/2 months.   All questions were answered. The patient knows to call the clinic with any problems, questions or concerns. We can certainly see the patient much sooner if necessary.  I spent 25 minutes counseling the patient face to face. The total time spent in the appointment was 30 minutes  Drue Second, MD Medical/Oncology [facility] [phone] (beeper) [phone] (Office)

## 2012-03-01 ENCOUNTER — Encounter: Payer: Self-pay | Admitting: Radiation Oncology

## 2012-03-01 ENCOUNTER — Encounter (INDEPENDENT_AMBULATORY_CARE_PROVIDER_SITE_OTHER): Payer: Self-pay | Admitting: Surgery

## 2012-03-01 ENCOUNTER — Ambulatory Visit (INDEPENDENT_AMBULATORY_CARE_PROVIDER_SITE_OTHER): Payer: BC Managed Care – PPO | Admitting: Surgery

## 2012-03-01 VITALS — BP 110/82 | HR 66 | Temp 98.2°F | Resp 12 | Ht 61.5 in | Wt 131.5 lb

## 2012-03-01 DIAGNOSIS — D0510 Intraductal carcinoma in situ of unspecified breast: Secondary | ICD-10-CM

## 2012-03-01 DIAGNOSIS — D0590 Unspecified type of carcinoma in situ of unspecified breast: Secondary | ICD-10-CM

## 2012-03-01 NOTE — Progress Notes (Signed)
Re:   ALLISHA HARTER DOB:   1961/07/31 MRN:   [id number]  [facility]  ASSESSMENT AND PLAN: 1.  Left Breast Ca, Tis, Nx.  DCIS - 8 mm microca++, 9 mm on MRI,  ER/PR 100%.  Final pathology showed only 0.3 cm of DCIS.  Seeing Drs. Park Breed and Michell Heinrich.  Expect rad tx by Dr. Michell Heinrich.  Follow up with me in 6 months.  2.  Irregular heart rate/palpitations, on Inderal. 3.  Depression.  Has seen Dr. Lafayette Dragon once. 4.  History of kidney stones. 5.  Genetic testing was negative.  DN 02/02/2012.   REFERRING PHYSICIAN:  Dr. Orvan July, [facility]  HISTORY OF PRESENT ILLNESS: DANESHA KIRCHOFF is a 51 y.o. (DOB: 07/03/1961)  white female whose primary care physician is Dr. Orvan July, [facility], and comes for follow up of left breast lumpectomy.  She has done well with the surgery.  Has no pain or concern.  We talked about pancreatic cancer, her sister's breast cancer, and her mother's colon cancer.  She has a sister, Etheleen Sia, who had breast cancer treated by Dr. Cindee Lame in 2008.    Past Medical History  Diagnosis Date  . Irregular heartbeat     PCP- managed by F. Wong-Rx inderal   . Irregular heart beat   . Wears glasses   . Bruises easily   . Depression   . Dysrhythmia     palpitations - irreg. heartbeats- takes Inderal   . Anemia     history of  . H/O renal calculi     lithotripsy x1  . Breast cancer DCIS    left      Current Outpatient Prescriptions  Medication Sig Dispense Refill  . buPROPion (WELLBUTRIN XL) 300 MG 24 hr tablet Take 300 mg by mouth daily after breakfast.       . citalopram (CELEXA) 40 MG tablet Take 40 mg by mouth at bedtime.       . clonazePAM (KLONOPIN) 1 MG tablet Take 0.5 mg by mouth at bedtime as needed. For anxiety      . diazepam (VALIUM) 2 MG tablet Take 2 mg by mouth as needed. For pain      . Multiple Vitamin (MULTIVITAMIN) tablet Take 1 tablet by mouth daily.      . propranolol (INDERAL) 60 MG tablet Take 60 mg by mouth daily at 8 pm.         No Known  Allergies  REVIEW OF SYSTEMS: Cardiac:  Irregular heart rate/palpitations.  Does not see a cardiologist. Endocrine:  No diabetes. No thyroid disease. Gastrointestinal:  No history of stomach disease.  No history of liver disease.  No history of gall bladder disease.  No history of pancreas disease.  Mother and grandmother had colon ca.  She had a colonoscopy about 2 years ago. GYN:  Has seen Dr. Charma Igo in the past. Psycho-social:  The patient is oriented.  History of depression.  SOCIAL and FAMILY HISTORY: Married, though husband not with patient.  He is a Visual merchandiser. 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 One son - 36.  PHYSICAL EXAM: BP 110/82  Pulse 66  Temp 98.2 F (36.8 C) (Temporal)  Resp 12  Ht 5' 1.5" (1.562 m)  Wt 131 lb 8 oz (59.648 kg)  BMI 24.44 kg/m2  LMP 12/13/2011  General: WN WF who is alert and generally healthy appearing.  Breasts:  Left: Incision in the UOQ of the left breast  looks good.  Right: No abnormality.  DATA REVIEWED: Path report to patient.  Ovidio Kin, MD,  [facility] [address]  [address]   [place], [place]    [address] Phone:  [phone] FAX:  [phone]

## 2012-03-01 NOTE — Progress Notes (Signed)
51 year old female  Seen in [facility] on 01/03/2012. Pathology from left breast lumpectomy done 02/19/12 confirmed high grade DCIS, ER PR positive.   NKDA No indication of a pacemaker No hx of radiation therapy PCP Orvan July, MD Surgeon Ezzard Standing Med Onc Wel[facility] Flakes

## 2012-03-05 ENCOUNTER — Encounter: Payer: Self-pay | Admitting: Radiation Oncology

## 2012-03-05 DIAGNOSIS — C50412 Malignant neoplasm of upper-outer quadrant of left female breast: Secondary | ICD-10-CM | POA: Insufficient documentation

## 2012-03-06 ENCOUNTER — Ambulatory Visit
Admission: RE | Admit: 2012-03-06 | Discharge: 2012-03-06 | Disposition: A | Discharge status: Home / Self Care | Payer: BC Managed Care – PPO | Source: Ambulatory Visit | Attending: Radiation Oncology | Admitting: Radiation Oncology

## 2012-03-06 ENCOUNTER — Encounter: Payer: Self-pay | Admitting: Radiation Oncology

## 2012-03-06 VITALS — BP 97/65 | HR 70 | Temp 98.1°F | Resp 18 | Ht 61.0 in | Wt 131.9 lb

## 2012-03-06 DIAGNOSIS — Z79899 Other long term (current) drug therapy: Secondary | ICD-10-CM | POA: Insufficient documentation

## 2012-03-06 DIAGNOSIS — D0510 Intraductal carcinoma in situ of unspecified breast: Secondary | ICD-10-CM

## 2012-03-06 DIAGNOSIS — C50919 Malignant neoplasm of unspecified site of unspecified female breast: Secondary | ICD-10-CM

## 2012-03-06 DIAGNOSIS — D0590 Unspecified type of carcinoma in situ of unspecified breast: Secondary | ICD-10-CM | POA: Insufficient documentation

## 2012-03-06 NOTE — Progress Notes (Signed)
Complete PATIENT MEASURE OF DISTRESS worksheet with a score of 6 submitted to social work. Patient denies need to see social work today. Complete NUTRITION RISK SCREEN worksheet without concerns submitted to Zenovia Jarred, RD.

## 2012-03-06 NOTE — Progress Notes (Signed)
   Department of Radiation Oncology  Phone:  [phone] Fax:        [phone]   Name: Mandy Vargas   DOB: [birth year]  MRN: [id number]    Date: 03/06/2012  Follow Up Visit Note  CC: Mandy Vargas  Diagnosis: DCIS of the left breast  Allergies: No Known Allergies  Medications:  Current Outpatient Prescriptions  Medication Sig Dispense Refill  . buPROPion (WELLBUTRIN XL) 300 MG 24 hr tablet Take 300 mg by mouth daily after breakfast.       . citalopram (CELEXA) 40 MG tablet Take 40 mg by mouth at bedtime.       . clonazePAM (KLONOPIN) 1 MG tablet Take 0.5 mg by mouth at bedtime as needed. For anxiety      . magnesium 30 MG tablet Take 30 mg by mouth 2 (two) times daily.      . Multiple Vitamin (MULTIVITAMIN) tablet Take 1 tablet by mouth daily.      . propranolol (INDERAL) 60 MG tablet Take 60 mg by mouth daily at 8 pm.      . diazepam (VALIUM) 2 MG tablet Take 2 mg by mouth as needed. For pain      . HYDROcodone-acetaminophen (NORCO) 5-325 MG per tablet         Interval History: Mandy Vargas presents today for routine followup.  She is feeling well and doing well. She is recovered well from her surgery and is enjoying taking care of her grandson. She really had no pain after surgery. She now has just occasional zaps of pain but really otherwise is doing well. She would like to receive her care in each in if at all possible. Her grandson is with her until about 2:00 so an afternoon appointment time would be best.   Physical Exam:   height is 5\' 1"  (1.549 m) and weight is 131 lb 14.4 oz (59.829 kg). Her oral temperature is 98.1 F (36.7 C). Her blood pressure is 97/65 and her pulse is 70. Her respiration is 18.  Her scar is well-healed. She is in no distress  IMPRESSION: DCIS of the left breast status post lumpectomy  PLAN:  Mandy Vargas is a 51 y.o. female who is 2 weeks out from surgery and ready to proceed on with radiation. I scheduled her for simulation on July 5 at her  request in either [place]. I will have my scheduler skull her with specific information regarding that. We have gone over the possible risks and benefits of treatment including but not limited to damage to the heart lungs and ribs. We discussed skin irritation and fatigue. She signed informed consent and agree to proceed forward. She has met with Dr. Welton Flakes who recommended tamoxifen. She scheduled her an appointment in 3 months.    Lurline Hare, MD

## 2012-03-06 NOTE — Progress Notes (Signed)
Patient presents to the clinic today unaccompanied for a consult with Dr. Michell Heinrich to discuss the role of xrt in the treatment of breast ca. Patient is alert and oriented to person, place, and time. No distress noted. Steady gait noted. Pleasant affect noted. Patient denies pain at this time. Patient reports she had an occasional breast sharp shooting pain in her left breast after surgery but, reports that has stopped. Patient reports her incisions from surgery have close and are without redness, edema or drainage. Patient denies nipple discharge. Patient denies nausea, vomiting, headache, dizziness or diarrhea. Patient reports her weight remains stable. Patient reports eating and sleeping without difficulty. Reported all findings to Dr. Michell Heinrich.  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

## 2012-03-06 NOTE — Progress Notes (Signed)
See progress note under physician encounter. 

## 2012-04-08 ENCOUNTER — Ambulatory Visit: Payer: BC Managed Care – PPO | Admitting: Oncology

## 2012-05-02 ENCOUNTER — Telehealth: Payer: Self-pay | Admitting: *Deleted

## 2012-05-02 NOTE — Telephone Encounter (Signed)
Pt called states " do i really need to come to this appt, i have only one income." Reviewed with MD. Pt is to be seen as scheduled to discuss next step in treatment. Pt verbalized understanding

## 2012-05-15 ENCOUNTER — Telehealth: Payer: Self-pay | Admitting: *Deleted

## 2012-05-15 ENCOUNTER — Telehealth: Payer: Self-pay | Admitting: Oncology

## 2012-05-15 NOTE — Telephone Encounter (Signed)
Pt called advised she will be unable to make appt 9/5. She will be here on 9/25 3pm lab/ 3:30 MD. onc tx sent to cancel 9/5 appt

## 2012-05-15 NOTE — Telephone Encounter (Signed)
S/w the pt and she is aware of her sept appts °

## 2012-05-16 ENCOUNTER — Ambulatory Visit: Payer: BC Managed Care – PPO | Admitting: Oncology

## 2012-05-16 ENCOUNTER — Other Ambulatory Visit: Payer: BC Managed Care – PPO

## 2012-05-30 ENCOUNTER — Ambulatory Visit: Payer: BC Managed Care – PPO | Admitting: Oncology

## 2012-06-04 ENCOUNTER — Telehealth: Payer: Self-pay | Admitting: Oncology

## 2012-06-04 NOTE — Telephone Encounter (Signed)
lmonvm adviisng the pt of her r/s sept appts to oct due to achange in the md's schedule

## 2012-06-05 ENCOUNTER — Other Ambulatory Visit: Payer: BC Managed Care – PPO | Admitting: Lab

## 2012-06-05 ENCOUNTER — Ambulatory Visit: Payer: BC Managed Care – PPO | Admitting: Oncology

## 2012-07-10 ENCOUNTER — Encounter: Payer: Self-pay | Admitting: Adult Health

## 2012-07-10 ENCOUNTER — Ambulatory Visit (HOSPITAL_BASED_OUTPATIENT_CLINIC_OR_DEPARTMENT_OTHER): Payer: BC Managed Care – PPO | Admitting: Adult Health

## 2012-07-10 ENCOUNTER — Telehealth: Payer: Self-pay | Admitting: Oncology

## 2012-07-10 ENCOUNTER — Other Ambulatory Visit (HOSPITAL_BASED_OUTPATIENT_CLINIC_OR_DEPARTMENT_OTHER): Payer: BC Managed Care – PPO | Admitting: Lab

## 2012-07-10 VITALS — BP 107/65 | HR 65 | Temp 98.7°F | Resp 20 | Ht 61.0 in | Wt 131.8 lb

## 2012-07-10 DIAGNOSIS — D0510 Intraductal carcinoma in situ of unspecified breast: Secondary | ICD-10-CM

## 2012-07-10 DIAGNOSIS — D0590 Unspecified type of carcinoma in situ of unspecified breast: Secondary | ICD-10-CM

## 2012-07-10 DIAGNOSIS — C50919 Malignant neoplasm of unspecified site of unspecified female breast: Secondary | ICD-10-CM

## 2012-07-10 DIAGNOSIS — Z170 Estrogen receptor positive status [ER+]: Secondary | ICD-10-CM

## 2012-07-10 LAB — COMPREHENSIVE METABOLIC PANEL (CC13)
ALT: 28 U/L (ref 0–55)
Albumin: 3.7 g/dL (ref 3.5–5.0)
Alkaline Phosphatase: 69 U/L (ref 40–150)
CO2: 31 mEq/L — ABNORMAL HIGH (ref 22–29)
Glucose: 104 mg/dl — ABNORMAL HIGH (ref 70–99)
Potassium: 4.2 mEq/L (ref 3.5–5.1)
Sodium: 141 mEq/L (ref 136–145)
Total Bilirubin: 0.23 mg/dL (ref 0.20–1.20)
Total Protein: 6.6 g/dL (ref 6.4–8.3)

## 2012-07-10 LAB — CBC WITH DIFFERENTIAL/PLATELET
BASO%: 0.6 % (ref 0.0–2.0)
Eosinophils Absolute: 0.1 10*3/uL (ref 0.0–0.5)
LYMPH%: 34.3 % (ref 14.0–49.7)
MCHC: 34.6 g/dL (ref 31.5–36.0)
MONO#: 0.4 10*3/uL (ref 0.1–0.9)
NEUT#: 2.2 10*3/uL (ref 1.5–6.5)
RBC: 3.86 10*6/uL (ref 3.70–5.45)
RDW: 12.9 % (ref 11.2–14.5)
WBC: 4.1 10*3/uL (ref 3.9–10.3)
lymph#: 1.4 10*3/uL (ref 0.9–3.3)

## 2012-07-10 MED ORDER — TAMOXIFEN CITRATE 20 MG PO TABS: 20.0000 mg | ORAL_TABLET | Freq: Every day | ORAL | Status: DC

## 2012-07-10 NOTE — Patient Instructions (Signed)
Doing well.  Start taking the Tamoxifen daily.  Call us with any concerns or problems.  We will see you back in 3 months.

## 2012-07-10 NOTE — Progress Notes (Signed)
OFFICE PROGRESS NOTE  CC Dr. Ovidio Kin Dr. 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  #2 patient will proceed initially with radiation therapy and she would like to have this in [place].graph   #3 patient had genetic testing done that was negative for BRCA 2 and 1 gene mutations.  #4 Radiation therapy  CURRENT THERAPY:Tamoxifen  INTERVAL HISTORY: Mandy Vargas 51 y.o. female returns for follow up.  She has been doing well.  She completed her radiation therapy and has returned to discuss starting Tamoxifen.  She has been performing self breast exams, and is w/o any questions or concerns.    MEDICAL HISTORY: Past Medical History  Diagnosis Date  . Irregular heartbeat     PCP- managed by F. Wong-Rx inderal   . Wears glasses   . Bruises easily   . Depression   . Dysrhythmia     palpitations - irreg. heartbeats- takes Inderal   . Anemia     history of  . H/O renal calculi     lithotripsy x1  . Breast cancer DCIS    left   . Chronic kidney disease     kidney stone removed    ALLERGIES:   has no known allergies.  MEDICATIONS:  Current Outpatient Prescriptions  Medication Sig Dispense Refill  . buPROPion (WELLBUTRIN XL) 300 MG 24 hr tablet Take 300 mg by mouth daily after breakfast.       . citalopram (CELEXA) 40 MG tablet Take 40 mg by mouth at bedtime.       . clonazePAM (KLONOPIN) 1 MG tablet Take 0.5 mg by mouth at bedtime as needed. For anxiety      . diazepam (VALIUM) 2 MG tablet Take 2 mg by mouth as needed. For pain      . HYDROcodone-acetaminophen (NORCO) 5-325 MG per tablet       . magnesium 30 MG tablet Take 30 mg  by mouth 2 (two) times daily.      . Multiple Vitamin (MULTIVITAMIN) tablet Take 1 tablet by mouth daily.      . propranolol (INDERAL) 60 MG tablet Take 60 mg by mouth daily at 8 pm.        SURGICAL HISTORY:  Past Surgical History  Procedure Date  . Dilation and curettage of uterus 1993  . Kidney stone surgery   . Breast biopsy 12/22/11    DCIS w/calcifications  . Breast lumpectomy 02/19/12    left breast, DCIS, ER/PR+    REVIEW OF SYSTEMS:   General: fatigue (-), night sweats (-), fever (-), pain (-) Lymph: palpable nodes (-) HEENT: vision changes (-), mucositis (-), gum bleeding (-), epistaxis (-) Cardiovascular: chest pain (-), palpitations (-) Pulmonary: shortness of breath (-), dyspnea on exertion (-), cough (-), hemoptysis (-) GI:  Early satiety (-), melena (-), dysphagia (-), nausea/vomiting (-), diarrhea (-) GU: dysuria (-), hematuria (-), incontinence (-) Musculoskeletal: joint swelling (-), joint pain (-), back pain (-) Neuro: weakness (-), numbness (-), headache (-), confusion (-) Skin: Rash (-), lesions (-), dryness (-) Psych: depression (-), suicidal/homicidal ideation (-), feeling of hopelessness (-)  Health  Maintenance  Mammogram: 12/2011 Colonoscopy: 2011 Bone Density Scan: 2010-normal Pap Smear: due 07/2012 Eye Exam: 08/2011 Vitamin D Level: n/a Lipid Panel: 12/2011    PHYSICAL EXAMINATION:  BP 107/65  Pulse 65  Temp 98.7 F (37.1 C) (Oral)  Resp 20  Ht 5\' 1"  (1.549 m)  Wt 131 lb 12.8 oz (59.784 kg)  BMI 24.90 kg/m2 General: Patient is a well appearing female in no acute distress HEENT: PERRLA, sclerae anicteric no conjunctival pallor, MMM Neck: supple, no palpable adenopathy Lungs: clear to auscultation bilaterally, no wheezes, rhonchi, or rales Cardiovascular: regular rate rhythm, S1, S2, no murmurs, rubs or gallops Abdomen: Soft, non-tender, non-distended, normoactive bowel sounds, no HSM Extremities: warm and well perfused, no clubbing,  cyanosis, or edema Skin: No rashes or lesions Neuro: Non-focal Bilateral breast examination left breast reveals a healed incisional scar no masses or nipple discharge. Right breast no masses or nipple discharge. ECOG PERFORMANCE STATUS: 0 - Asymptomatic  LABORATORY DATA: Lab Results  Component Value Date   WBC 4.1 07/10/2012   HGB 12.2 07/10/2012   HCT 35.2 07/10/2012   MCV 91.1 07/10/2012   PLT 157 07/10/2012      Chemistry      Component Value Date/Time   NA 136 02/14/2012 1141   K 4.6 02/14/2012 1141   CL 103 02/14/2012 1141   CO2 28 02/14/2012 1141   BUN 25* 02/14/2012 1141   CREATININE 0.71 02/14/2012 1141      Component Value Date/Time   CALCIUM 9.8 02/14/2012 1141   ALKPHOS 54 01/03/2012 0801   AST 19 01/03/2012 0801   ALT 14 01/03/2012 0801   BILITOT 0.3 01/03/2012 0801      FINAL DIAGNOSIS Diagnosis Breast, lumpectomy, Left - DUCTAL CARCINOMA IN SITU, SEE COMMENT. - IN SITU CARCINOMA IS 0.4 CM FROM THE NEAREST MARGIN (POSTERIOR). - SEE TUMOR SYNOPTIC TEMPLATE BELOW. Microscopic Comment BREAST, IN SITU CARCINOMA Specimen, including laterality: Left breast. Procedure: Lumpectomy. Grade of carcinoma: II. Necrosis: Absent. Estimated tumor size: (glass slide measurement): 0.3 cm. Treatment effect: None. If present, treatment effect in breast tissue, lymph nodes or both: N/A. Distance to closest margin: 0.4 cm. If margin positive, focally or broadly: N/A. Breast prognostic profile: Not repeated. Estrogen receptor: Previous study demonstrated 100% positivity ([id number]). Progesterone receptor: Previous study demonstrated 100% positivity ([id number]). Lymph nodes: Examined: 0. Lymph nodes with metastasis: N/A. TNM: pTis, pNX. Comments: Additional findings include: previous biopsy site, fibrocystic change, micro-calcifications, and diminutive radial scar. (CRR:eps 02/21/12) [place] RUND DO Pathologist, Electronic Signature (Case signed 02/21/2012) Specimen Gross and  Clinical Information 1 of 2 RADIOGRAPHIC STUDIES:  Mm Breast Surgical Specimen  02/19/2012  *RADIOLOGY REPORT*  Clinical Data:  Left breast carcinoma.  NEEDLE LOCALIZATION WITH MAMMOGRAPHIC GUIDANCE AND SPECIMEN RADIOGRAPH  Patient presents for needle localization prior to surgical excision of left breast carcinoma.  I met with the patient and we discussed the procedure of needle localization including benefits and alternatives. We discussed the high likelihood of a successful procedure. We discussed the risks of the procedure, including infection, bleeding, tissue injury, and further surgery. Informed, written consent was given.  Using mammographic guidance, sterile technique, 2% lidocaine and a 7 cm modified Kopans needle, the clip was localized using a lateral approach.  The films are marked for Dr. Ezzard Standing.  Specimen radiograph was performed at day surgery, and confirms the clip and intact wire with residual calcifications to be present in the tissue sample.  The specimen is marked for pathology.  IMPRESSION: Needle localization of  the left breast.  No apparent complications.  Original Report Authenticated By: Rolla Plate, M.D.   Mm Breast Wire Localization Left  02/19/2012  *RADIOLOGY REPORT*  Clinical Data:  Left breast carcinoma.  NEEDLE LOCALIZATION WITH MAMMOGRAPHIC GUIDANCE AND SPECIMEN RADIOGRAPH  Patient presents for needle localization prior to surgical excision of left breast carcinoma.  I met with the patient and we discussed the procedure of needle localization including benefits and alternatives. We discussed the high likelihood of a successful procedure. We discussed the risks of the procedure, including infection, bleeding, tissue injury, and further surgery. Informed, written consent was given.  Using mammographic guidance, sterile technique, 2% lidocaine and a 7 cm modified Kopans needle, the clip was localized using a lateral approach.  The films are marked for Dr. Ezzard Standing.  Specimen  radiograph was performed at day surgery, and confirms the clip and intact wire with residual calcifications to be present in the tissue sample.  The specimen is marked for pathology.  IMPRESSION: Needle localization of the left breast.  No apparent complications.  Original Report Authenticated By: Rolla Plate, M.D.    ASSESSMENT: 51 year old female with stage 0 (DCIS measuring 0.3 cm) tumor was ER/PR positive. Patient is now status post lumpectomy. Overall she tolerated the surgery well.  Recommendation is for patient to proceed with her radiation therapy. Once she completes this then she will go onto chemoprevention with tamoxifen 20 mg daily. This was clearly discussed with the patient in detail.   PLAN: Ms. Freida Busman has completed her radiation therapy.  She is now ready to start Tamoxifen.  I counseled her regarding adverse effects of the drug and on health maintenance.  We will see her back in 3 months to ensure she is tolerating the Tamoxifen well.    All questions were answered. The patient knows to call the clinic with any problems, questions or concerns. We can certainly see the patient much sooner if necessary.  I spent 25 minutes counseling the patient face to face. The total time spent in the appointment was 30 minutes  Abdulmalik Darco C. Lyn Hollingshead, NP Medical Oncology [facility] Phone: [phone]

## 2012-07-10 NOTE — Telephone Encounter (Signed)
gve the pt her jan 2014 appt calendar °

## 2012-07-16 ENCOUNTER — Encounter: Payer: Self-pay | Admitting: Cardiology

## 2012-07-16 ENCOUNTER — Ambulatory Visit (INDEPENDENT_AMBULATORY_CARE_PROVIDER_SITE_OTHER): Payer: BC Managed Care – PPO | Admitting: Cardiology

## 2012-07-16 VITALS — BP 94/52 | HR 73 | Ht 61.0 in | Wt 132.4 lb

## 2012-07-16 DIAGNOSIS — R000 Tachycardia, unspecified: Secondary | ICD-10-CM

## 2012-07-16 DIAGNOSIS — R002 Palpitations: Secondary | ICD-10-CM

## 2012-07-16 NOTE — Assessment & Plan Note (Signed)
Long-standing history since 2005, although patient reports that she actually has fairly good control of symptoms with current dose of Inderal. As best I can tell, there was some question about whether perhaps her medication was leading to fatigue, although she states that she has been feeling better over the last few weeks. Also reports being under a lot of stress within the last few months. She has a vague sense of increased heart rate in the afternoon sometimes, takes her Inderal in the evening. Her ECG is nonspecific. It is not clear that medication needs to be modified at this time. Certainly, if she has progressive palpitations, would suggest repeat monitoring to see if she is having any arrhythmias or perhaps just ectopy. Other choices for treatment from a medication perspective would be a different beta blocker such as atenolol or metoprolol, alternatively a calcium channel blocker such as diltiazem could be chosen. I recommended that she continue followup with Dr. Modesto Charon. We can see her back if her symptoms worsen.

## 2012-07-16 NOTE — Progress Notes (Signed)
Clinical Summary Mandy Vargas is a 51 y.o.female referred for cardiology consultation by Dr. Modesto Charon. She reports a history of palpitations beginning back in 2005. She states she wore a monitor at that time, does not recall being told of any specific arrhythmias. She has been on Inderal for several years, and reports that she typically has very good control of her palpitations.   She states that she has been under a lot of stress, and within the last few months did notice some fatigue, occasional sense that her heart rate was increased in the afternoon. She takes her Inderal, which I presume is long-acting, once in the evening.  ECG reviewed today showing sinus rhythm, nonspecific T wave changes. She reinforces that she generally has felt better, less fatigued, has otherwise not changed her medication. She has had no syncope or progressive palpitations.   No Known Allergies  Current Outpatient Prescriptions  Medication Sig Dispense Refill  . buPROPion (WELLBUTRIN XL) 300 MG 24 hr tablet Take 300 mg by mouth daily after breakfast.       . clonazePAM (KLONOPIN) 1 MG tablet Take 0.5 mg by mouth at bedtime as needed. For anxiety      . diazepam (VALIUM) 2 MG tablet Take 2 mg by mouth as needed. For pain      . escitalopram (LEXAPRO) 20 MG tablet Take 20 mg by mouth at bedtime.      . Magnesium 250 MG TABS Take 1 tablet by mouth every evening.      . Multiple Vitamin (MULTIVITAMIN) tablet Take 1 tablet by mouth daily.      . propranolol (INDERAL) 60 MG tablet Take 60 mg by mouth daily at 8 pm.      . tamoxifen (NOLVADEX) 20 MG tablet Take 1 tablet (20 mg total) by mouth daily.  90 tablet  12    Past Medical History  Diagnosis Date  . Wears glasses   . Bruises easily   . Depression   . Palpitations     On Inderal - Dr. Modesto Charon  . Anemia   . Breast cancer     Left - DCIS  . Chronic kidney disease     kidney stone removed  . Nephrolithiasis     Status post lithotripsy    Past Surgical  History  Procedure Date  . Dilation and curettage of uterus 1993  . Kidney stone surgery   . Breast biopsy 12/22/11    DCIS w/calcifications  . Breast lumpectomy 02/19/12    Left breast, DCIS, ER/PR+    Family History  Problem Relation Age of Onset  . Colon cancer Mother   . Breast cancer Sister   . Cancer Sister 67    Breast  . Colon cancer Paternal Grandmother     68'S  . Anesthesia problems Neg Hx   . Cancer Maternal Aunt     Breast    Social History Mandy Vargas reports that she quit smoking about 10 months ago. Her smoking use included Cigarettes. She has a .25 pack-year smoking history. She does not have any smokeless tobacco history on file. Mandy Vargas reports that she does not drink alcohol.  Review of Systems Negative except as outlined above.  Physical Examination Filed Vitals:   07/16/12 1442  BP: 94/52  Pulse: 73   Filed Weights   07/16/12 1442  Weight: 132 lb 6.4 oz (60.056 kg)   Patient no acute distress. HEENT: Conjunctiva and lids normal, oropharynx clear. Neck: Supple, no elevated JVP  or carotid bruits, no thyromegaly. Lungs: Clear to auscultation, nonlabored breathing at rest. Cardiac: Regular rate and rhythm, no S3 or significant systolic murmur, no pericardial rub. Abdomen: Soft, nontender, bowel sounds present. Extremities: No pitting edema, distal pulses 2+. Skin: Warm and dry. Musculoskeletal: No kyphosis. Neuropsychiatric: Alert and oriented x3, affect grossly appropriate.   Problem List and Plan   Palpitations Long-standing history since 2005, although patient reports that she actually has fairly good control of symptoms with current dose of Inderal. As best I can tell, there was some question about whether perhaps her medication was leading to fatigue, although she states that she has been feeling better over the last few weeks. Also reports being under a lot of stress within the last few months. She has a vague sense of increased heart  rate in the afternoon sometimes, takes her Inderal in the evening. Her ECG is nonspecific. It is not clear that medication needs to be modified at this time. Certainly, if she has progressive palpitations, would suggest repeat monitoring to see if she is having any arrhythmias or perhaps just ectopy. Other choices for treatment from a medication perspective would be a different beta blocker such as atenolol or metoprolol, alternatively a calcium channel blocker such as diltiazem could be chosen. I recommended that she continue followup with Dr. Modesto Charon. We can see her back if her symptoms worsen.    Jonelle Sidle, M.D., F.A.C.C.

## 2012-07-16 NOTE — Patient Instructions (Addendum)
Your physician recommends that you schedule a follow-up appointment as needed.   Your physician recommends that you continue on your current medications as directed. Please refer to the Current Medication list given to you today.  

## 2012-09-17 ENCOUNTER — Other Ambulatory Visit: Payer: Self-pay | Admitting: Obstetrics and Gynecology

## 2012-10-09 ENCOUNTER — Ambulatory Visit: Payer: BC Managed Care – PPO | Admitting: Oncology

## 2012-10-09 ENCOUNTER — Other Ambulatory Visit: Payer: BC Managed Care – PPO

## 2012-10-10 ENCOUNTER — Ambulatory Visit (INDEPENDENT_AMBULATORY_CARE_PROVIDER_SITE_OTHER): Payer: BC Managed Care – PPO | Admitting: Surgery

## 2012-10-10 ENCOUNTER — Telehealth: Payer: Self-pay | Admitting: Oncology

## 2012-10-10 VITALS — BP 126/74 | HR 70 | Temp 97.0°F | Resp 18 | Ht 61.5 in | Wt 135.0 lb

## 2012-10-10 DIAGNOSIS — D0590 Unspecified type of carcinoma in situ of unspecified breast: Secondary | ICD-10-CM

## 2012-10-10 DIAGNOSIS — D0510 Intraductal carcinoma in situ of unspecified breast: Secondary | ICD-10-CM

## 2012-10-10 NOTE — Telephone Encounter (Signed)
Returned pt's call and r/s 1/29 appt to 5/5. Date ok w/pt as appt has be worked around her schedule.

## 2012-10-10 NOTE — Progress Notes (Addendum)
Re:   Mandy Vargas DOB:   [birth year] MRN:   [id number]  [facility]  ASSESSMENT AND PLAN: 1.  Left Breast Ca, Tis, Nx.  DCIS - 8 mm microca++, 9 mm on MRI,  ER/PR 100%. - left breast lumpectomy - 02/19/2012  Final pathology showed only 0.3 cm of DCIS.  Seeing Drs. Park Breed and Michell Heinrich.  Finished rad tx by Dr. Michell Heinrich in August 2013.  She is now on Tamoxifen.  Follow up with me in 6 months. [Patient called about pain in her left chest after lifting her grandson.  She probably just strained a muscle.  Discussed by phone. DN 10/16/2012]  2.  Irregular heart rate/palpitations, on Inderal. 3.  Depression.  Has seen Dr. Lafayette Dragon once. 4.  History of kidney stones. 5.  Genetic testing was negative.  DN 02/02/2012.  REFERRING PHYSICIAN:  Dr. Orvan July, [facility]  HISTORY OF PRESENT ILLNESS: Mandy Vargas is a 52 y.o. (DOB: [birth year])  white female whose primary care physician is Dr. Orvan July, [facility], and comes for follow up of left breast lumpectomy.  She has done well with the surgery.  She has no area of concern or mass.  She is out of school because of the snow.  She has a sister, Mandy Vargas, who had breast cancer treated by Dr. Cindee Lame in 2008.   Past Medical History  Diagnosis Date  . Wears glasses   . Bruises easily   . Depression   . Palpitations     On Inderal - Dr. Modesto Charon  . Anemia   . Breast cancer     Left - DCIS  . Chronic kidney disease     kidney stone removed  . Nephrolithiasis     Status post lithotripsy     Current Outpatient Prescriptions  Medication Sig Dispense Refill  . buPROPion (WELLBUTRIN XL) 300 MG 24 hr tablet Take 300 mg by mouth once. 1/2 day      . diazepam (VALIUM) 2 MG tablet Take 2 mg by mouth as needed. For pain      . escitalopram (LEXAPRO) 20 MG tablet Take 20 mg by mouth at bedtime.      . Magnesium 250 MG TABS Take 1 tablet by mouth every evening.      . metoprolol tartrate (LOPRESSOR) 25 MG tablet       . Multiple Vitamin (MULTIVITAMIN) tablet  Take 1 tablet by mouth daily.      . propranolol (INDERAL) 60 MG tablet Take 60 mg by mouth daily at 8 pm.      . tamoxifen (NOLVADEX) 20 MG tablet Take 1 tablet (20 mg total) by mouth daily.  90 tablet  12  . clonazePAM (KLONOPIN) 1 MG tablet Take 0.5 mg by mouth at bedtime as needed. For anxiety      . TAMIFLU 75 MG capsule          No Known Allergies  REVIEW OF SYSTEMS: Cardiac:  Irregular heart rate/palpitations.  Does not see a cardiologist. Endocrine:  No diabetes. No thyroid disease. Gastrointestinal:  No history of stomach disease.  No history of liver disease.  No history of gall bladder disease.  No history of pancreas disease.  Mother and grandmother had colon ca.  She had a colonoscopy about 2 years ago. GYN:  Has seen Dr. Charma Igo in the past. Psycho-social:  The patient is oriented.  History of depression.  SOCIAL and FAMILY HISTORY: Married, though husband not with patient.  He is a  farmer. 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 One son - 64.  PHYSICAL EXAM: BP 126/74  Pulse 70  Temp 97 F (36.1 C)  Resp 18  Ht 5' 1.5" (1.562 m)  Wt 135 lb (61.236 kg)  BMI 25.10 kg/m2  General: WN WF who is alert and generally healthy appearing.  HEENT:  Pupils equal.  Dentition good. NECK:  Supple.  No thyroid mass. LYMPH NODES:  No cervical, supraclavicular, or axillary adenopathy. BREASTS:  Left: Incision in the UOQ of the left breast looks good.  She has some faint pigmentation of the skin.  Right: No abnormality. UPPER EXTREMITIES:  No evidence of lymphedema.  DATA REVIEWED: Path report to patient.  Mandy Kin, MD,  [facility] [address] [place]  [address]   [place], [place]    [address] Phone:  [phone] FAX:  [phone]

## 2012-10-15 ENCOUNTER — Telehealth (INDEPENDENT_AMBULATORY_CARE_PROVIDER_SITE_OTHER): Payer: Self-pay

## 2012-10-15 NOTE — Telephone Encounter (Signed)
The pt said she was just seen last week.  On Saturday she lifted her grandchild and thinks she may have pulled something on the left where she had her breast surgery.   She feels some pain when she takes a breath and moves certain ways and wants to know if she could have done some damage.

## 2012-10-25 ENCOUNTER — Telehealth (INDEPENDENT_AMBULATORY_CARE_PROVIDER_SITE_OTHER): Payer: Self-pay

## 2012-10-25 NOTE — Telephone Encounter (Signed)
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 Yesterday  she  had extreme pain in the breast area . After resting she felt better. The breast  area remain sore. I advised her to apply warm compresses to the area 15 - 30 minutes  q 4hrs prn Pain and to take Ibuprofen 600 mg  q6hrs /Prn pain ; Follow up with her PCP on Monday if not better.

## 2013-01-13 ENCOUNTER — Other Ambulatory Visit (HOSPITAL_BASED_OUTPATIENT_CLINIC_OR_DEPARTMENT_OTHER): Payer: BC Managed Care – PPO | Admitting: Lab

## 2013-01-13 ENCOUNTER — Ambulatory Visit (HOSPITAL_BASED_OUTPATIENT_CLINIC_OR_DEPARTMENT_OTHER): Payer: BC Managed Care – PPO | Admitting: Oncology

## 2013-01-13 ENCOUNTER — Telehealth: Payer: Self-pay | Admitting: Oncology

## 2013-01-13 VITALS — BP 104/57 | HR 73 | Temp 98.8°F | Resp 20 | Ht 61.5 in | Wt 133.2 lb

## 2013-01-13 DIAGNOSIS — C50919 Malignant neoplasm of unspecified site of unspecified female breast: Secondary | ICD-10-CM

## 2013-01-13 DIAGNOSIS — Z170 Estrogen receptor positive status [ER+]: Secondary | ICD-10-CM

## 2013-01-13 DIAGNOSIS — D0590 Unspecified type of carcinoma in situ of unspecified breast: Secondary | ICD-10-CM

## 2013-01-13 LAB — COMPREHENSIVE METABOLIC PANEL (CC13)
AST: 24 U/L (ref 5–34)
Alkaline Phosphatase: 51 U/L (ref 40–150)
BUN: 21.3 mg/dL (ref 7.0–26.0)
Calcium: 9.3 mg/dL (ref 8.4–10.4)
Creatinine: 0.9 mg/dL (ref 0.6–1.1)
Glucose: 101 mg/dl — ABNORMAL HIGH (ref 70–99)

## 2013-01-13 LAB — CBC WITH DIFFERENTIAL/PLATELET
Basophils Absolute: 0 10*3/uL (ref 0.0–0.1)
EOS%: 1.3 % (ref 0.0–7.0)
Eosinophils Absolute: 0.1 10*3/uL (ref 0.0–0.5)
HCT: 35.5 % (ref 34.8–46.6)
HGB: 11.8 g/dL (ref 11.6–15.9)
LYMPH%: 29 % (ref 14.0–49.7)
MCH: 30 pg (ref 25.1–34.0)
MCV: 90 fL (ref 79.5–101.0)
MONO%: 7.5 % (ref 0.0–14.0)
NEUT%: 62 % (ref 38.4–76.8)
Platelets: 139 10*3/uL — ABNORMAL LOW (ref 145–400)

## 2013-01-13 MED ORDER — TAMOXIFEN CITRATE 20 MG PO TABS: 20.0000 mg | ORAL_TABLET | Freq: Every day | ORAL | Status: DC

## 2013-01-13 NOTE — Progress Notes (Signed)
OFFICE PROGRESS NOTE  CC Dr. Ovidio Kin Dr. 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  #2 patient will proceed initially with radiation therapy and she would like to have this in [place].she received her radiation in [place] and completed in October 2013.  #3 patient had genetic testing done that was negative for BRCA 2 and 1 gene mutations.   #4 patient began tamoxifen 20 mg daily in October 2013. Overall she's doing well with this she has no problems. She will continue this for 5 years.  CURRENT THERAPY:Tamoxifen 20 mg daily  INTERVAL HISTORY: Mandy Vargas 52 y.o. female returns for follow up.  She has been doing well.  Thus far she has tolerated tamoxifen except for hot flashes. She has no nausea no vomiting no abdominal pain no headaches double vision blurring of vision she has no vaginal bleeding or discharge she has not noticed any swelling in her lower extremities. Remainder of the 10 point review of systems is negative.    MEDICAL HISTORY: Past Medical History  Diagnosis Date  . Wears glasses   . Bruises easily   . Depression   . Palpitations     On Inderal - Dr. Modesto Charon  . Anemia   . Breast cancer     Left - DCIS  . Chronic kidney disease     kidney stone removed  . Nephrolithiasis     Status post lithotripsy    ALLERGIES:  has No Known Allergies.  MEDICATIONS:  Current Outpatient Prescriptions  Medication Sig Dispense Refill  . buPROPion (WELLBUTRIN XL) 300 MG 24 hr tablet Take 300 mg by mouth once. 1/2 day      . diazepam (VALIUM) 2 MG tablet Take 2 mg by mouth as needed. For pain      . escitalopram (LEXAPRO) 20 MG  tablet Take 20 mg by mouth at bedtime.      . Magnesium 250 MG TABS Take 1 tablet by mouth every evening.      . metoprolol tartrate (LOPRESSOR) 25 MG tablet       . Multiple Vitamin (MULTIVITAMIN) tablet Take 1 tablet by mouth daily.      . tamoxifen (NOLVADEX) 20 MG tablet Take 1 tablet (20 mg total) by mouth daily.  90 tablet  12  . clonazePAM (KLONOPIN) 1 MG tablet Take 0.5 mg by mouth at bedtime as needed. For anxiety      . propranolol (INDERAL) 60 MG tablet Take 60 mg by mouth daily at 8 pm.      . TAMIFLU 75 MG capsule        No current facility-administered medications for this visit.    SURGICAL HISTORY:  Past Surgical History  Procedure Laterality Date  . Dilation and curettage of uterus  1993  . Kidney stone surgery    . Breast biopsy  12/22/11    DCIS w/calcifications  . Breast lumpectomy  02/19/12    Left breast, DCIS, ER/PR+    REVIEW OF SYSTEMS:   General: fatigue (-), night sweats (-), fever (-), pain (-) Lymph: palpable nodes (-) HEENT: vision changes (-), mucositis (-),  gum bleeding (-), epistaxis (-) Cardiovascular: chest pain (-), palpitations (-) Pulmonary: shortness of breath (-), dyspnea on exertion (-), cough (-), hemoptysis (-) GI:  Early satiety (-), melena (-), dysphagia (-), nausea/vomiting (-), diarrhea (-) GU: dysuria (-), hematuria (-), incontinence (-) Musculoskeletal: joint swelling (-), joint pain (-), back pain (-) Neuro: weakness (-), numbness (-), headache (-), confusion (-) Skin: Rash (-), lesions (-), dryness (-) Psych: depression (-), suicidal/homicidal ideation (-), feeling of hopelessness (-)  Health Maintenance  Mammogram: 12/2011 Colonoscopy: 2011 Bone Density Scan: 2010-normal Pap Smear: due 07/2012 Eye Exam: 08/2011 Vitamin D Level: n/a Lipid Panel: 12/2011    PHYSICAL EXAMINATION:  BP 104/57  Pulse 73  Temp(Src) 98.8 F (37.1 C) (Oral)  Resp 20  Ht 5' 1.5" (1.562 m)  Wt 133 lb 3.2 oz (60.419 kg)  BMI 24.76  kg/m2 General: Patient is a well appearing female in no acute distress HEENT: PERRLA, sclerae anicteric no conjunctival pallor, MMM Neck: supple, no palpable adenopathy Lungs: clear to auscultation bilaterally, no wheezes, rhonchi, or rales Cardiovascular: regular rate rhythm, S1, S2, no murmurs, rubs or gallops Abdomen: Soft, non-tender, non-distended, normoactive bowel sounds, no HSM Extremities: warm and well perfused, no clubbing, cyanosis, or edema Skin: No rashes or lesions Neuro: Non-focal Bilateral breast examination left breast reveals a healed incisional scar no masses or nipple discharge. Right breast no masses or nipple discharge. ECOG PERFORMANCE STATUS: 0 - Asymptomatic  LABORATORY DATA: Lab Results  Component Value Date   WBC 4.7 01/13/2013   HGB 11.8 01/13/2013   HCT 35.5 01/13/2013   MCV 90.0 01/13/2013   PLT 139* 01/13/2013      Chemistry      Component Value Date/Time   NA 140 01/13/2013 1428   NA 136 02/14/2012 1141   K 3.7 01/13/2013 1428   K 4.6 02/14/2012 1141   CL 103 01/13/2013 1428   CL 103 02/14/2012 1141   CO2 30* 01/13/2013 1428   CO2 28 02/14/2012 1141   BUN 21.3 01/13/2013 1428   BUN 25* 02/14/2012 1141   CREATININE 0.9 01/13/2013 1428   CREATININE 0.71 02/14/2012 1141      Component Value Date/Time   CALCIUM 9.3 01/13/2013 1428   CALCIUM 9.8 02/14/2012 1141   ALKPHOS 51 01/13/2013 1428   ALKPHOS 54 01/03/2012 0801   AST 24 01/13/2013 1428   AST 19 01/03/2012 0801   ALT 17 01/13/2013 1428   ALT 14 01/03/2012 0801   BILITOT 0.36 01/13/2013 1428   BILITOT 0.3 01/03/2012 0801      FINAL DIAGNOSIS Diagnosis Breast, lumpectomy, Left - DUCTAL CARCINOMA IN SITU, SEE COMMENT. - IN SITU CARCINOMA IS 0.4 CM FROM THE NEAREST MARGIN (POSTERIOR). - SEE TUMOR SYNOPTIC TEMPLATE BELOW. Microscopic Comment BREAST, IN SITU CARCINOMA Specimen, including laterality: Left breast. Procedure: Lumpectomy. Grade of carcinoma: II. Necrosis: Absent. Estimated tumor size: (glass slide measurement):  0.3 cm. Treatment effect: None. If present, treatment effect in breast tissue, lymph nodes or both: N/A. Distance to closest margin: 0.4 cm. If margin positive, focally or broadly: N/A. Breast prognostic profile: Not repeated. Estrogen receptor: Previous study demonstrated 100% positivity ([id number]). Progesterone receptor: Previous study demonstrated 100% positivity ([id number]). Lymph nodes: Examined: 0. Lymph nodes with metastasis: N/A. TNM: pTis, pNX. Comments: Additional findings include: previous biopsy site, fibrocystic change, micro-calcifications, and diminutive radial scar. (CRR:eps 02/21/12) [place] RUND DO Pathologist, Electronic Signature (Case signed 02/21/2012) Specimen Gross and Clinical Information 1 of 2 RADIOGRAPHIC STUDIES:  Mm Breast Surgical Specimen  02/19/2012  *  RADIOLOGY REPORT*  Clinical Data:  Left breast carcinoma.  NEEDLE LOCALIZATION WITH MAMMOGRAPHIC GUIDANCE AND SPECIMEN RADIOGRAPH  Patient presents for needle localization prior to surgical excision of left breast carcinoma.  I met with the patient and we discussed the procedure of needle localization including benefits and alternatives. We discussed the high likelihood of a successful procedure. We discussed the risks of the procedure, including infection, bleeding, tissue injury, and further surgery. Informed, written consent was given.  Using mammographic guidance, sterile technique, 2% lidocaine and a 7 cm modified Kopans needle, the clip was localized using a lateral approach.  The films are marked for Dr. Ezzard Standing.  Specimen radiograph was performed at day surgery, and confirms the clip and intact wire with residual calcifications to be present in the tissue sample.  The specimen is marked for pathology.  IMPRESSION: Needle localization of the left breast.  No apparent complications.  Original Report Authenticated By: Rolla Plate, M.D.   Mm Breast Wire Localization Left  02/19/2012  *RADIOLOGY REPORT*   Clinical Data:  Left breast carcinoma.  NEEDLE LOCALIZATION WITH MAMMOGRAPHIC GUIDANCE AND SPECIMEN RADIOGRAPH  Patient presents for needle localization prior to surgical excision of left breast carcinoma.  I met with the patient and we discussed the procedure of needle localization including benefits and alternatives. We discussed the high likelihood of a successful procedure. We discussed the risks of the procedure, including infection, bleeding, tissue injury, and further surgery. Informed, written consent was given.  Using mammographic guidance, sterile technique, 2% lidocaine and a 7 cm modified Kopans needle, the clip was localized using a lateral approach.  The films are marked for Dr. Ezzard Standing.  Specimen radiograph was performed at day surgery, and confirms the clip and intact wire with residual calcifications to be present in the tissue sample.  The specimen is marked for pathology.  IMPRESSION: Needle localization of the left breast.  No apparent complications.  Original Report Authenticated By: Rolla Plate, M.D.    ASSESSMENT: 52 year old female with   #1 stage 0 (DCIS measuring 0.3 cm) tumor was ER/PR positive. Patient is now status post lumpectomy. Overall she tolerated the surgery well.  She then went on to receive radiation therapy. She has tolerated it well. In October 2013 she was begun on tamoxifen 20 mg as chemoprevention. Thus far she tells me she's doing well except for some hot flashes.   PLAN:  #1 continue tamoxifen 20 mg daily.  #2 she will return in 6 months time for followup.  All questions were answered. The patient knows to call the clinic with any problems, questions or concerns. We can certainly see the patient much sooner if necessary.  I spent 25 minutes counseling the patient face to face. The total time spent in the appointment was 30 minutes  Drue Second, MD Medical/Oncology [facility] [phone] (beeper) [phone] (Office)  01/13/2013,  6:06 PM

## 2013-01-13 NOTE — Telephone Encounter (Signed)
, °

## 2013-01-13 NOTE — Patient Instructions (Addendum)
Continue tamoxifen 20 mg daily  We will see you back i December 2014

## 2013-01-20 ENCOUNTER — Other Ambulatory Visit: Payer: Self-pay | Admitting: Family Medicine

## 2013-02-06 ENCOUNTER — Other Ambulatory Visit: Payer: Self-pay | Admitting: Obstetrics and Gynecology

## 2013-02-06 DIAGNOSIS — Z853 Personal history of malignant neoplasm of breast: Secondary | ICD-10-CM

## 2013-02-06 DIAGNOSIS — Z9889 Other specified postprocedural states: Secondary | ICD-10-CM

## 2013-02-27 ENCOUNTER — Other Ambulatory Visit: Payer: Self-pay | Admitting: Family Medicine

## 2013-02-28 NOTE — Telephone Encounter (Signed)
Last seen 11/04/12  FPW

## 2013-03-16 ENCOUNTER — Other Ambulatory Visit: Payer: Self-pay | Admitting: Nurse Practitioner

## 2013-04-10 ENCOUNTER — Ambulatory Visit (INDEPENDENT_AMBULATORY_CARE_PROVIDER_SITE_OTHER): Payer: BC Managed Care – PPO | Admitting: Surgery

## 2013-04-10 ENCOUNTER — Ambulatory Visit
Admission: RE | Admit: 2013-04-10 | Discharge: 2013-04-10 | Disposition: A | Discharge status: Home / Self Care | Payer: BC Managed Care – PPO | Source: Ambulatory Visit | Attending: Obstetrics and Gynecology | Admitting: Obstetrics and Gynecology

## 2013-04-10 DIAGNOSIS — Z9889 Other specified postprocedural states: Secondary | ICD-10-CM

## 2013-04-10 DIAGNOSIS — Z853 Personal history of malignant neoplasm of breast: Secondary | ICD-10-CM

## 2013-04-24 ENCOUNTER — Encounter: Payer: Self-pay | Admitting: Family Medicine

## 2013-04-24 ENCOUNTER — Ambulatory Visit (INDEPENDENT_AMBULATORY_CARE_PROVIDER_SITE_OTHER): Payer: BC Managed Care – PPO | Admitting: Family Medicine

## 2013-04-24 VITALS — BP 96/61 | HR 66 | Temp 98.7°F | Wt 137.4 lb

## 2013-04-24 DIAGNOSIS — F4320 Adjustment disorder, unspecified: Secondary | ICD-10-CM

## 2013-04-24 DIAGNOSIS — F418 Other specified anxiety disorders: Secondary | ICD-10-CM

## 2013-04-24 DIAGNOSIS — N200 Calculus of kidney: Secondary | ICD-10-CM

## 2013-04-24 DIAGNOSIS — D0512 Intraductal carcinoma in situ of left breast: Secondary | ICD-10-CM

## 2013-04-24 DIAGNOSIS — C50912 Malignant neoplasm of unspecified site of left female breast: Secondary | ICD-10-CM

## 2013-04-24 DIAGNOSIS — R002 Palpitations: Secondary | ICD-10-CM

## 2013-04-24 DIAGNOSIS — L909 Atrophic disorder of skin, unspecified: Secondary | ICD-10-CM

## 2013-04-24 DIAGNOSIS — L918 Other hypertrophic disorders of the skin: Secondary | ICD-10-CM | POA: Insufficient documentation

## 2013-04-24 MED ORDER — METOPROLOL TARTRATE 25 MG PO TABS: 25.0000 mg | ORAL_TABLET | Freq: Two times a day (BID) | ORAL | Status: DC

## 2013-04-24 MED ORDER — ESCITALOPRAM OXALATE 20 MG PO TABS: ORAL_TABLET | ORAL | Status: DC

## 2013-04-24 MED ORDER — BUPROPION HCL ER (XL) 300 MG PO TB24: 300.0000 mg | ORAL_TABLET | Freq: Once | ORAL | Status: DC

## 2013-04-24 NOTE — Progress Notes (Signed)
Patient ID: Mandy Vargas, female   DOB: 1961/08/31, 52 y.o.   MRN: [id number] SUBJECTIVE: CC: Chief Complaint  Patient presents with  . Follow-up    medication refill wellburtruin , metroprolol lexapro check skin tag rt ey concerned about it so close to eye    HPI: Doing well. Had breast cancer last year and doing great. Here for follow up of her medications lexapro and wellbutrin. Had reduced the dose of the welbutrin by a 1/2 and doing great.  Needs refills.  Doing well  Larey Seat off a step stool and has a black eye on the right. Denies any abuse. She is now divorce and has a boy friend that she sees occasionally.   Past Medical History  Diagnosis Date  . Wears glasses   . Bruises easily   . Depression   . Palpitations     On Inderal - Dr. Modesto Charon  . Anemia   . Breast cancer     Left - DCIS  . Chronic kidney disease     kidney stone removed  . Nephrolithiasis     Status post lithotripsy   Past Surgical History  Procedure Laterality Date  . Dilation and curettage of uterus  1993  . Kidney stone surgery    . Breast biopsy  12/22/11    DCIS w/calcifications  . Breast lumpectomy  02/19/12    Left breast, DCIS, ER/PR+   History   Social History  . Marital Status: Single    Spouse Name: N/A    Number of Children: N/A  . Years of Education: N/A   Occupational History  . Not on file.   Social History Main Topics  . Smoking status: Former Smoker -- 0.25 packs/day for 1 years    Types: Cigarettes    Quit date: 09/12/2011  . Smokeless tobacco: Not on file  . Alcohol Use: No  . Drug Use: No  . Sexual Activity: Yes   Other Topics Concern  . 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 Colon cancer Mother   . Breast cancer Sister   . Cancer Sister 35    Breast  . Colon cancer Paternal Grandmother     61'S  . Anesthesia  problems Neg Hx   . Cancer Maternal Aunt     Breast   Current Outpatient Prescriptions on File Prior to Visit  Medication Sig Dispense Refill  . diazepam (VALIUM) 2 MG tablet Take 2 mg by mouth as needed. For pain      . Magnesium 250 MG TABS Take 1 tablet by mouth every evening.      . Multiple Vitamin (MULTIVITAMIN) tablet Take 1 tablet by mouth daily.      . tamoxifen (NOLVADEX) 20 MG tablet Take 1 tablet (20 mg total) by mouth daily.  90 tablet  12   No current facility-administered medications on file prior to visit.   No Known Allergies  There is no immunization history on file for this patient. Prior to Admission medications   Medication Sig Start Date End Date Taking? Authorizing Provider  buPROPion (WELLBUTRIN XL) 300 MG 24 hr tablet Take 1 tablet (300 mg total) by mouth once. 1/2 day 04/24/13  Yes Ileana Ladd, MD  diazepam (VALIUM) 2 MG tablet Take 2 mg by mouth as needed. For pain 01/11/12  Yes Historical  Provider, MD  escitalopram (LEXAPRO) 20 MG tablet TAKE 1 TABLET BY MOUTH DAILY 04/24/13  Yes Ileana Ladd, MD  Magnesium 250 MG TABS Take 1 tablet by mouth every evening.   Yes Historical Provider, MD  metoprolol tartrate (LOPRESSOR) 25 MG tablet Take 1 tablet (25 mg total) by mouth 2 (two) times daily. 04/24/13  Yes Ileana Ladd, MD  Multiple Vitamin (MULTIVITAMIN) tablet Take 1 tablet by mouth daily.   Yes Historical Provider, MD  tamoxifen (NOLVADEX) 20 MG tablet Take 1 tablet (20 mg total) by mouth daily. 01/13/13  Yes Victorino December, MD     ROS: As above in the HPI. All other systems are stable or negative.  OBJECTIVE: APPEARANCE:  Patient in no acute distress.The patient appeared well nourished and normally developed. Acyanotic. Waist: VITAL SIGNS:BP 96/61  Pulse 66  Temp(Src) 98.7 F (37.1 C) (Oral)  Wt 137 lb 6.4 oz (62.324 kg)  BMI 25.54 kg/m2  LMP 12/13/2011  WF  SKIN: warm and  Dry without overt rashes, tattoos and scars Has skin tag right  maxillary area. And left occiput.   HEAD and Neck: without JVD, Head and scalp: normal Eyes:No scleral icterus. Fundi normal, eye movements normal. Ecchymosis the right orbit Ears: Auricle normal, canal normal, Tympanic membranes normal, insufflation normal. Nose: normal Throat: normal Neck & thyroid: normal  CHEST & LUNGS: Chest wall: normal Lungs: Clear  CVS: Reveals the PMI to be normally located. Regular rhythm, First and Second Heart sounds are normal,  absence of murmurs, rubs or gallops. Peripheral vasculature: Radial pulses: normal Dorsal pedis pulses: normal Posterior pulses: normal  ABDOMEN:  Appearance: normal Benign, no organomegaly, no masses, no Abdominal Aortic enlargement. No Guarding , no rebound. No Bruits. Bowel sounds: normal  RECTAL: N/A GU: N/A  EXTREMETIES: nonedematous. Both Femoral and Pedal pulses are normal.  MUSCULOSKELETAL:  Spine: normal Joints: intact  NEUROLOGIC: oriented to time,place and person; nonfocal. Strength is normal Sensory is normal Reflexes are normal Cranial Nerves are normal.  ASSESSMENT: Anxiety associated with depression  Breast cancer, left  DCIS (ductal carcinoma in situ) of breast, left  Kidney stones  Skin tag  Adjustment reaction - Plan: escitalopram (LEXAPRO) 20 MG tablet, buPROPion (WELLBUTRIN XL) 300 MG 24 hr tablet  Palpitations - Plan: metoprolol tartrate (LOPRESSOR) 25 MG tablet  PLAN:  Skin Tag Removal Procedure Note  Pre-operative Diagnosis: Classic skin tags   Post-operative Diagnosis: Classic skin tags  Locations:right maxillary area  Indications: irritating Anesthesia: none  Procedure Details  The risks (including bleeding and infection) and benefits of the procedure and  informed consent obtained. Using sterile iris scissors, a single skin tag was snipped off at the base after cleansing with alcohol.  Bleeding was controlled by pressure.   Findings: Pathognomonic benign  lesions  not sent for pathological exam.  Condition: Stable  Complications: none.  Plan: 1. Instructed to keep the wounds dry and covered for 24-48h and clean thereafter. 2. Warning signs of infection were reviewed.   3 Return as needed.   Meds ordered this encounter  Medications  . escitalopram (LEXAPRO) 20 MG tablet    Sig: TAKE 1 TABLET BY MOUTH DAILY    Dispense:  30 tablet    Refill:  5  . buPROPion (WELLBUTRIN XL) 300 MG 24 hr tablet    Sig: Take 1 tablet (300 mg total) by mouth once. 1/2 day    Dispense:  30 tablet    Refill:  5  . metoprolol tartrate (  LOPRESSOR) 25 MG tablet    Sig: Take 1 tablet (25 mg total) by mouth 2 (two) times daily.    Dispense:  60 tablet    Refill:  5   Results for orders placed in visit on 01/13/13  CBC WITH DIFFERENTIAL      Result Value Range   WBC 4.7  3.9 - 10.3 10e3/uL   NEUT# 2.9  1.5 - 6.5 10e3/uL   HGB 11.8  11.6 - 15.9 g/dL   HCT 29.5  62.1 - 30.8 %   Platelets 139 (*) 145 - 400 10e3/uL   MCV 90.0  79.5 - 101.0 fL   MCH 30.0  25.1 - 34.0 pg   MCHC 33.4  31.5 - 36.0 g/dL   RBC 6.57  8.46 - 9.62 10e6/uL   RDW 12.8  11.2 - 14.5 %   lymph# 1.4  0.9 - 3.3 10e3/uL   MONO# 0.4  0.1 - 0.9 10e3/uL   Eosinophils Absolute 0.1  0.0 - 0.5 10e3/uL   Basophils Absolute 0.0  0.0 - 0.1 10e3/uL   NEUT% 62.0  38.4 - 76.8 %   LYMPH% 29.0  14.0 - 49.7 %   MONO% 7.5  0.0 - 14.0 %   EOS% 1.3  0.0 - 7.0 %   BASO% 0.2  0.0 - 2.0 %  COMPREHENSIVE METABOLIC PANEL (CC13)      Result Value Range   Sodium 140  136 - 145 mEq/L   Potassium 3.7  3.5 - 5.1 mEq/L   Chloride 103  98 - 107 mEq/L   CO2 30 (*) 22 - 29 mEq/L   Glucose 101 (*) 70 - 99 mg/dl   BUN 95.2  7.0 - 84.1 mg/dL   Creatinine 0.9  0.6 - 1.1 mg/dL   Total Bilirubin 3.24  0.20 - 1.20 mg/dL   Alkaline Phosphatase 51  40 - 150 U/L   AST 24  5 - 34 U/L   ALT 17  0 - 55 U/L   Total Protein 6.6  6.4 - 8.3 g/dL   Albumin 3.2 (*) 3.5 - 5.0 g/dL   Calcium 9.3  8.4 - 40.1 mg/dL    Return in about 4 months (around 08/24/2013) for recheck BP, Recheck medical problems.  Dalayla Aldredge P. Modesto Charon, M.D.

## 2013-05-16 ENCOUNTER — Encounter: Payer: Self-pay | Admitting: Family Medicine

## 2013-05-16 ENCOUNTER — Ambulatory Visit (INDEPENDENT_AMBULATORY_CARE_PROVIDER_SITE_OTHER): Payer: BC Managed Care – PPO | Admitting: Family Medicine

## 2013-05-16 VITALS — BP 101/48 | HR 64 | Temp 97.7°F | Wt 137.8 lb

## 2013-05-16 DIAGNOSIS — L918 Other hypertrophic disorders of the skin: Secondary | ICD-10-CM

## 2013-05-16 DIAGNOSIS — L909 Atrophic disorder of skin, unspecified: Secondary | ICD-10-CM

## 2013-05-16 NOTE — Progress Notes (Signed)
Subjective:     Patient ID: Mandy Vargas, female   DOB: [birth year], 52 y.o.   MRN: [id number] Chief Complaint  Patient presents with  . Follow-up    wants to see if skin tag can be removed on neck area and check mole     HPI  Has another skin tag at the back of the neck that gets caught on jewelry . She would like to have it removed.  Review of Systems    nil else  Objective:   Physical Exam    WF BP 101/48  Pulse 64  Temp(Src) 97.7 F (36.5 C) (Oral)  Wt 137 lb 12.8 oz (62.506 kg)  BMI 25.62 kg/m2  LMP 12/13/2011  Skin: multiple bening skin tags but there is a large pedunculated  Skin tag 2 mm diameter at the back of the neck.. Assessment:     Skin tag    Plan:     Skin Tag Removal Procedure Note  Pre-operative Diagnosis: Classic skin tags (acrochordon)  Post-operative Diagnosis: Classic skin tags (acrochordon)  Locations: around the neck  Indications: gets irritated and bleeds with jewelry  Anesthesia: ethyl chloride  Procedure Details  The risks (including bleeding and infection) and benefits of the procedure and verbal informed consent obtained. Using sterile iris scissors,the large skin tag was snipped off at the base after cleansing with Betadine.  Bleeding was controlled by pressure and Monsel's solution  Findings: Pathognomonic benign lesions  not sent for pathological exam.  Condition: Stable  Complications: none.  Plan: 1. Instructed to keep the wounds dry and covered for 24-48h and clean thereafter. 2. Warning signs of infection were reviewed.   3. Return as needed.     Return if symptoms worsen or fail to improve.  Loanne Emery P. Modesto Charon, M.D.

## 2013-05-27 ENCOUNTER — Telehealth: Payer: Self-pay | Admitting: Family Medicine

## 2013-05-27 ENCOUNTER — Other Ambulatory Visit: Payer: Self-pay | Admitting: Family Medicine

## 2013-05-27 MED ORDER — FLUTICASONE PROPIONATE 50 MCG/ACT NA SUSP: 2.0000 | Freq: Every day | NASAL | Status: DC

## 2013-05-27 NOTE — Telephone Encounter (Signed)
Patient aware.

## 2013-05-27 NOTE — Telephone Encounter (Signed)
Prescription ordered in EPIC.for flonase.

## 2013-06-05 ENCOUNTER — Telehealth: Payer: Self-pay | Admitting: Family Medicine

## 2013-06-06 NOTE — Telephone Encounter (Signed)
Spoke with pt been on nasal spray for about 1 week and states no better.  Advised to give a little more time, denies any fever. Instructed to call next week to let us know if nasal spray a ny better.

## 2013-06-23 ENCOUNTER — Telehealth: Payer: Self-pay | Admitting: Family Medicine

## 2013-06-23 NOTE — Telephone Encounter (Signed)
She ask to speak with Almira Coaster /Dr,[facility]  Please call her back

## 2013-06-23 NOTE — Telephone Encounter (Signed)
Spoke with pt c/o sinus issues "feels like fog in my nose and I am using the sprays  appt given 06-24-13

## 2013-06-24 ENCOUNTER — Encounter: Payer: Self-pay | Admitting: Family Medicine

## 2013-06-24 ENCOUNTER — Encounter (INDEPENDENT_AMBULATORY_CARE_PROVIDER_SITE_OTHER): Payer: Self-pay

## 2013-06-24 ENCOUNTER — Ambulatory Visit (INDEPENDENT_AMBULATORY_CARE_PROVIDER_SITE_OTHER): Payer: BC Managed Care – PPO | Admitting: Family Medicine

## 2013-06-24 VITALS — BP 95/56 | HR 60 | Temp 97.9°F | Ht 61.5 in | Wt 138.0 lb

## 2013-06-24 DIAGNOSIS — J069 Acute upper respiratory infection, unspecified: Secondary | ICD-10-CM

## 2013-06-24 DIAGNOSIS — F4320 Adjustment disorder, unspecified: Secondary | ICD-10-CM

## 2013-06-24 DIAGNOSIS — N951 Menopausal and female climacteric states: Secondary | ICD-10-CM

## 2013-06-24 DIAGNOSIS — R232 Flushing: Secondary | ICD-10-CM

## 2013-06-24 MED ORDER — AZITHROMYCIN 250 MG PO TABS: ORAL_TABLET | ORAL | Status: DC

## 2013-06-24 MED ORDER — CITALOPRAM HYDROBROMIDE 40 MG PO TABS: 40.0000 mg | ORAL_TABLET | Freq: Every day | ORAL | Status: DC

## 2013-06-24 NOTE — Progress Notes (Signed)
  Subjective:    Patient ID: Mandy Vargas, female    DOB: 06/16/1961, 52 y.o.   MRN: [id number]  HPI This 52 y.o. female presents for evaluation of URI and sinus congestion for over a week. She also c/o worsening depression problems.  She also c/o hot flashes and menopausal Sx's and wonders if she can get a test to check and see if she is having menopause. She denies homicidal or suicidal ideation.   Review of Systems C/o URI sx's and depression. No chest pain, SOB, HA, dizziness, vision change, N/V, diarrhea, constipation, dysuria, urinary urgency or frequency, myalgias, arthralgias or rash.     Objective:   Physical Exam Vital signs noted  Well developed well nourished female.  HEENT - Head atraumatic Normocephalic                Eyes - PERRLA, Conjuctiva - clear Sclera- Clear EOMI                Ears - EAC's Wnl TM's Wnl Gross Hearing WNL                Nose - Nares patent                 Throat - oropharanx wnl Respiratory - Lungs CTA bilateral Cardiac - RRR S1 and S2 without murmur GI - Abdomen soft Nontender and bowel sounds active x 4 Extremities - No edema. Neuro - Grossly intact.       Assessment & Plan:  URI, acute - Plan: azithromycin (ZITHROMAX) 250 MG tablet  Adjustment reaction - Plan: citalopram (CELEXA) 40 MG tablet  Hot flashes - Plan: FSH/LH  Follow up in 3-4 weeks with PCP Dr. Modesto Charon or follow up or RTC PRN if sx's worsen  Deatra Canter FNP

## 2013-06-24 NOTE — Patient Instructions (Signed)

## 2013-06-25 ENCOUNTER — Other Ambulatory Visit: Payer: Self-pay | Admitting: Family Medicine

## 2013-06-25 ENCOUNTER — Telehealth: Payer: Self-pay | Admitting: *Deleted

## 2013-06-25 NOTE — Telephone Encounter (Signed)
Try the celexa it has been known to work better.

## 2013-06-25 NOTE — Telephone Encounter (Signed)
PT SAID SHE HAS BEEN ON LEXAPRO 20MG  AND WE CALLED IN REFILL FOR CELEXA 40MG . HAS BEEN ON LEXAPRO. DID YOU MEAN TO CHANGE OR MEANT TO INCREASE LEXAPRO DOSE TO 40MG ? PLEASE ADVISE.

## 2013-07-02 NOTE — Telephone Encounter (Signed)
Left detailed message on personal voicemail

## 2013-07-08 ENCOUNTER — Telehealth: Payer: Self-pay | Admitting: Family Medicine

## 2013-07-08 NOTE — Telephone Encounter (Signed)
Dr Modesto Charon can you order some labs for her/please address

## 2013-07-09 NOTE — Telephone Encounter (Signed)
She will need an office visit 

## 2013-07-14 NOTE — Telephone Encounter (Signed)
Left message to return call 

## 2013-07-15 ENCOUNTER — Telehealth: Payer: Self-pay | Admitting: Family Medicine

## 2013-07-16 NOTE — Telephone Encounter (Signed)
WENT TO PHARMACY TO PICKUP RX FOR LEXAPRO AND CELEXA WAS CALLED IN. HAS BEEN TAKING LEXAPRO 40MG  AND NEEDS REFILL ON THIS. SEND IN TO [facility] IN [place]. PT OUT OF MEDICINE. ALSO WANTS LABS TO SEE IS POSTMENOPAUSAL?

## 2013-07-17 ENCOUNTER — Other Ambulatory Visit: Payer: Self-pay | Admitting: Family Medicine

## 2013-07-17 MED ORDER — ESCITALOPRAM OXALATE 20 MG PO TABS: 20.0000 mg | ORAL_TABLET | Freq: Every day | ORAL | Status: DC

## 2013-07-18 ENCOUNTER — Other Ambulatory Visit: Payer: Self-pay | Admitting: *Deleted

## 2013-07-18 MED ORDER — ESCITALOPRAM OXALATE 20 MG PO TABS: 40.0000 mg | ORAL_TABLET | Freq: Every day | ORAL | Status: DC

## 2013-07-18 NOTE — Progress Notes (Signed)
Lexapro was suppose to be increased to 40mg  qd. It was written for 20 mg tablets one qd but quantity of 60.  Changed to 20mg , 2 tablets daily # 60 Patient aware

## 2013-07-24 ENCOUNTER — Encounter: Payer: Self-pay | Admitting: Family Medicine

## 2013-07-24 ENCOUNTER — Ambulatory Visit (INDEPENDENT_AMBULATORY_CARE_PROVIDER_SITE_OTHER): Payer: BC Managed Care – PPO | Admitting: Family Medicine

## 2013-07-24 ENCOUNTER — Ambulatory Visit: Payer: BC Managed Care – PPO | Admitting: Family Medicine

## 2013-07-24 VITALS — BP 109/69 | HR 76 | Temp 98.0°F | Ht 61.5 in | Wt 138.0 lb

## 2013-07-24 DIAGNOSIS — M542 Cervicalgia: Secondary | ICD-10-CM

## 2013-07-24 DIAGNOSIS — J309 Allergic rhinitis, unspecified: Secondary | ICD-10-CM

## 2013-07-24 DIAGNOSIS — J329 Chronic sinusitis, unspecified: Secondary | ICD-10-CM

## 2013-07-24 NOTE — Progress Notes (Signed)
Subjective:    Patient ID: Mandy Vargas, female    DOB: 1960/12/25, 52 y.o.   MRN: [id number]  HPI Patient here today for sinus problem and HA. The sinus problem sorted to 3 weeks ago. She's had 2 rounds of Zithromax. She is also taking Flonase 2 sprays to each nostril. She started Sudafed the past 2-3 days. She denies ear pain or sore throat. She does mention that she has some right posterior neck pain and this happened after possibly sleeping in the bed and a way that was not comfortable.     Patient Active Problem List   Diagnosis Date Noted  . Skin tag 04/24/2013  . Adjustment reaction 04/24/2013  . Breast cancer   . 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 Palpitations 11/28/2010  . Kidney stones 11/28/2010  . Anxiety associated with depression 11/28/2010   Outpatient Encounter Prescriptions as of 07/24/2013  Medication Sig  . buPROPion (WELLBUTRIN XL) 300 MG 24 hr tablet Take 300 mg by mouth once.  . diazepam (VALIUM) 2 MG tablet Take 2 mg by mouth as needed. For pain  . escitalopram (LEXAPRO) 20 MG tablet Take 2 tablets (40 mg total) by mouth daily.  . fluticasone (FLONASE) 50 MCG/ACT nasal spray Place 2 sprays into the nose daily.  . Magnesium 250 MG TABS Take 1 tablet by mouth every evening.  . metoprolol tartrate (LOPRESSOR) 25 MG tablet Take 1 tablet (25 mg total) by mouth 2 (two) times daily.  . Multiple Vitamin (MULTIVITAMIN) tablet Take 1 tablet by mouth daily.  . tamoxifen (NOLVADEX) 20 MG tablet Take 1 tablet (20 mg total) by mouth daily.  . [DISCONTINUED] azithromycin (ZITHROMAX) 250 MG tablet Take 2 po first day and then one po qd x 4 days    Review of Systems  Constitutional: Negative.  Negative for fever and chills.  HENT: Positive for sinus pressure. Negative for congestion, postnasal drip and sore throat.   Eyes: Negative.   Respiratory: Negative.  Negative for cough.   Cardiovascular: Negative.   Gastrointestinal: Negative.    Endocrine: Negative.   Genitourinary: Negative.   Musculoskeletal: Negative.   Skin: Negative.   Allergic/Immunologic: Negative.   Neurological: Positive for dizziness and headaches.  Hematological: Negative.   Psychiatric/Behavioral: Negative.        Objective:   Physical Exam  Nursing note and vitals reviewed. Constitutional: She is oriented to person, place, and time. She appears well-developed and well-nourished. No distress.  HENT:  Head: Normocephalic and atraumatic.  Right Ear: External ear normal.  Left Ear: External ear normal.  Mouth/Throat: Oropharynx is clear and moist. No oropharyngeal exudate.  There is mild nasal congestion bilaterally  Eyes: Conjunctivae and EOM are normal. Pupils are equal, round, and reactive to light. Right eye exhibits no discharge. Left eye exhibits no discharge. No scleral icterus.  Neck: Normal range of motion. Neck supple. No thyromegaly present.  Cardiovascular: Normal rate, regular rhythm and normal heart sounds.   At 72 per minute  Pulmonary/Chest: Effort normal and breath sounds normal. No respiratory distress. She has no wheezes. She has no rales.  Musculoskeletal: Normal range of motion.  Lymphadenopathy:    She has no cervical adenopathy.  Neurological: She is alert and oriented to person, place, and time.  Skin: Skin is warm and dry. No rash noted.  Psychiatric: Her behavior is normal. Judgment and thought content normal.   BP 109/69  Pulse 76  Temp(Src) 98 F (36.7 C) (Oral)  Ht 5' 1.5" (1.562 m)  Wt 138 lb (62.596 kg)  BMI 25.66 kg/m2  LMP 12/13/2011        Assessment & Plan:   1. Allergic rhinitis   2. Sinusitis, improving   3. Neck pain on right side    Patient Instructions  Continue the use of Flonase regularly Continue Sudafed Watch caffeine intake Drink plenty of fluids If dizziness continues from sitting to standing consider trying some above-the-knee support nose especially this winter. Use saline  nose spray during the day Use a cool mist humidifier and keep the house as cool as possible  Take ibuprofen after meals or Aleve twice daily after meals as needed for neck pain and use warm wet compresses   Nyra Capes MD

## 2013-07-24 NOTE — Patient Instructions (Addendum)
Continue the use of Flonase regularly Continue Sudafed Watch caffeine intake Drink plenty of fluids If dizziness continues from sitting to standing consider trying some above-the-knee support nose especially this winter. Use saline nose spray during the day Use a cool mist humidifier and keep the house as cool as possible  Take ibuprofen after meals or Aleve twice daily after meals as needed for neck pain and use warm wet compresses

## 2013-08-18 ENCOUNTER — Telehealth: Payer: Self-pay | Admitting: Family Medicine

## 2013-08-18 NOTE — Telephone Encounter (Signed)
Needs to be seen

## 2013-08-19 NOTE — Telephone Encounter (Signed)
Pt aware and app given at pt request with Dr Modesto Charon

## 2013-08-25 ENCOUNTER — Encounter: Payer: Self-pay | Admitting: Family Medicine

## 2013-08-25 ENCOUNTER — Ambulatory Visit (INDEPENDENT_AMBULATORY_CARE_PROVIDER_SITE_OTHER): Payer: BC Managed Care – PPO

## 2013-08-25 ENCOUNTER — Ambulatory Visit (INDEPENDENT_AMBULATORY_CARE_PROVIDER_SITE_OTHER): Payer: BC Managed Care – PPO | Admitting: Family Medicine

## 2013-08-25 VITALS — BP 99/51 | HR 78 | Temp 97.0°F | Ht 61.5 in | Wt 137.0 lb

## 2013-08-25 DIAGNOSIS — G44309 Post-traumatic headache, unspecified, not intractable: Secondary | ICD-10-CM

## 2013-08-25 DIAGNOSIS — S0003XA Contusion of scalp, initial encounter: Secondary | ICD-10-CM

## 2013-08-25 DIAGNOSIS — S0093XA Contusion of unspecified part of head, initial encounter: Secondary | ICD-10-CM | POA: Insufficient documentation

## 2013-08-25 DIAGNOSIS — M542 Cervicalgia: Secondary | ICD-10-CM

## 2013-08-25 DIAGNOSIS — S0990XS Unspecified injury of head, sequela: Secondary | ICD-10-CM | POA: Insufficient documentation

## 2013-08-25 DIAGNOSIS — IMO0001 Reserved for inherently not codable concepts without codable children: Secondary | ICD-10-CM

## 2013-08-25 NOTE — Progress Notes (Signed)
Patient ID: Mandy Vargas, female   DOB: May 09, 1961, 52 y.o.   MRN: [id number] SUBJECTIVE: CC: Chief Complaint  Patient presents with  . Headache    Headache x 3 months. Patient fell in the shower and struck the front and back of her head about the same time the head pain began. Denies visual disturbances. No alleviating or contributing factors identified.     HPI: As above. Walked into the bathroom door 3 months ago and fell back and hit the occiput. Has had occipital headaches and neck pains  Since. Worse on lying flat. Neck is  Sore on and off. No weakness nor numbness of the arms or legs. Vision has been the same.   Past Medical History  Diagnosis Date  . Wears glasses   . Bruises easily   . Depression   . Palpitations     On Inderal - Dr. Modesto Charon  . Anemia   . Breast cancer     Left - DCIS  . Chronic kidney disease     kidney stone removed  . Nephrolithiasis     Status post lithotripsy   Past Surgical History  Procedure Laterality Date  . Dilation and curettage of uterus  1993  . Kidney stone surgery    . Breast biopsy  12/22/11    DCIS w/calcifications  . Breast lumpectomy  02/19/12    Left breast, DCIS, ER/PR+   History   Social History  . Marital Status: Single    Spouse Name: N/A    Number of Children: N/A  . Years of Education: N/A   Occupational History  . Not on file.   Social History Main Topics  . Smoking status: Former Smoker -- 0.25 packs/day for 1 years    Types: Cigarettes    Quit date: 09/12/2011  . Smokeless tobacco: Not on file  . Alcohol Use: No  . Drug Use: No  . Sexual Activity: Yes   Other Topics Concern  . 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 Colon cancer Mother   . Breast cancer Sister   . Cancer Sister 71    Breast  . Colon cancer Paternal Grandmother     71'S  . Anesthesia  problems Neg Hx   . Cancer Maternal Aunt     Breast   Current Outpatient Prescriptions on File Prior to Visit  Medication Sig Dispense Refill  . buPROPion (WELLBUTRIN XL) 300 MG 24 hr tablet Take 300 mg by mouth once.      . diazepam (VALIUM) 2 MG tablet Take 2 mg by mouth as needed. For pain      . escitalopram (LEXAPRO) 20 MG tablet Take 2 tablets (40 mg total) by mouth daily.  60 tablet  11  . fluticasone (FLONASE) 50 MCG/ACT nasal spray Place 2 sprays into the nose daily.  16 g  6  . Magnesium 250 MG TABS Take 1 tablet by mouth every evening.      . metoprolol tartrate (LOPRESSOR) 25 MG tablet Take 1 tablet (25 mg total) by mouth 2 (two) times daily.  60 tablet  5  . Multiple Vitamin (MULTIVITAMIN) tablet Take 1 tablet by mouth daily.      . tamoxifen (NOLVADEX) 20 MG tablet Take 1 tablet (20 mg total) by mouth daily.  90 tablet  12  No current facility-administered medications on file prior to visit.   No Known Allergies  There is no immunization history on file for this patient. Prior to Admission medications   Medication Sig Start Date End Date Taking? Authorizing Provider  buPROPion (WELLBUTRIN XL) 300 MG 24 hr tablet Take 300 mg by mouth once. 04/24/13  Yes Ileana Ladd, MD  diazepam (VALIUM) 2 MG tablet Take 2 mg by mouth as needed. For pain 01/11/12  Yes Historical Provider, MD  escitalopram (LEXAPRO) 20 MG tablet Take 2 tablets (40 mg total) by mouth daily. 07/18/13  Yes Deatra Canter, FNP  fluticasone (FLONASE) 50 MCG/ACT nasal spray Place 2 sprays into the nose daily. 05/27/13  Yes Ileana Ladd, MD  Magnesium 250 MG TABS Take 1 tablet by mouth every evening.   Yes Historical Provider, MD  metoprolol tartrate (LOPRESSOR) 25 MG tablet Take 1 tablet (25 mg total) by mouth 2 (two) times daily. 04/24/13  Yes Ileana Ladd, MD  Multiple Vitamin (MULTIVITAMIN) tablet Take 1 tablet by mouth daily.   Yes Historical Provider, MD  tamoxifen (NOLVADEX) 20 MG tablet Take 1 tablet (20  mg total) by mouth daily. 01/13/13  Yes Victorino December, MD     ROS: As above in the HPI. All other systems are stable or negative.  OBJECTIVE: APPEARANCE:  Patient in no acute distress.The patient appeared well nourished and normally developed. Acyanotic. Waist: VITAL SIGNS:BP 99/51  Pulse 78  Temp(Src) 97 F (36.1 C) (Oral)  Ht 5' 1.5" (1.562 m)  Wt 137 lb (62.143 kg)  BMI 25.47 kg/m2  LMP 12/13/2011   SKIN: warm and  Dry without overt rashes, tattoos and scars  HEAD and Neck: without JVD, Head and scalp: normal Eyes:No scleral icterus. Fundi normal, eye movements normal. Ears: Auricle normal, canal normal, Tympanic membranes normal, insufflation normal. Nose: normal Throat: normal Neck: slight reduction in the ROm of the neck thyroid: normal  CHEST & LUNGS: Chest wall: normal Lungs: Clear  CVS: Reveals the PMI to be normally located. Regular rhythm, First and Second Heart sounds are normal,  absence of murmurs, rubs or gallops. Peripheral vasculature: Radial pulses: normal Dorsal pedis pulses: normal Posterior pulses: normal  ABDOMEN:  Appearance: normal Benign, no organomegaly, no masses, no Abdominal Aortic enlargement. No Guarding , no rebound. No Bruits. Bowel sounds: normal  RECTAL: N/A GU: N/A  EXTREMETIES: nonedematous.  MUSCULOSKELETAL:  Spine: reduced ROM of the neck Joints: intact  NEUROLOGIC: oriented to time,place and person; nonfocal. Strength is normal Sensory is normal Reflexes are normal Cranial Nerves are normal. Able to hop without problems.   ASSESSMENT: Neck pain - Plan: DG Cervical Spine Complete  Headaches due to old head injury - Plan: CT Head Wo Contrast  Contusion of head, initial encounter  PLAN:  Orders Placed This Encounter  Procedures  . DG Cervical Spine Complete    Standing Status: Future     Number of Occurrences: 1     Standing Expiration Date: 10/26/2014    Order Specific Question:  Reason for Exam  (SYMPTOM  OR DIAGNOSIS REQUIRED)    Answer:  neck pain    Order Specific Question:  Is the patient pregnant?    Answer:  No    Order Specific Question:  Preferred imaging location?    Answer:  Internal  . CT Head Wo Contrast    Standing Status: Future     Number of Occurrences:      Standing Expiration Date: 11/24/2014  Order Specific Question:  Reason for Exam (SYMPTOM  OR DIAGNOSIS REQUIRED)    Answer:  3 months h/o head contusion with headaches when she lies down r/o subdural hematoma    Order Specific Question:  Is the patient pregnant?    Answer:  No    Order Specific Question:  Preferred imaging location?    Answer:  [facility]  [facility] reading (PRIMARY) by  Dr. Modesto Charon: degenerative changes of cervical spine with straightening of the spine.                                 Avoid aspirin, and NSAIDs. Use tylenol.  No orders of the defined types were placed in this encounter.   There are no discontinued medications. Return if symptoms worsen or fail to improve, for await CT of the head.  Marv Alfrey P. Modesto Charon, M.D.

## 2013-08-27 ENCOUNTER — Ambulatory Visit (HOSPITAL_COMMUNITY)
Admission: RE | Admit: 2013-08-27 | Discharge: 2013-08-27 | Disposition: A | Discharge status: Home / Self Care | Payer: BC Managed Care – PPO | Source: Ambulatory Visit | Attending: Family Medicine | Admitting: Family Medicine

## 2013-08-27 DIAGNOSIS — R51 Headache: Secondary | ICD-10-CM | POA: Insufficient documentation

## 2013-08-27 DIAGNOSIS — G44309 Post-traumatic headache, unspecified, not intractable: Secondary | ICD-10-CM

## 2013-08-28 ENCOUNTER — Telehealth: Payer: Self-pay | Admitting: Family Medicine

## 2013-08-28 NOTE — Telephone Encounter (Signed)
Please review [place]. Patient wants results.

## 2013-08-28 NOTE — Progress Notes (Signed)
Quick Note:  Call patient. CT head normal. No change in plan. ______

## 2013-08-31 NOTE — Telephone Encounter (Signed)
It was not a U/S , it was a CT scan of the head.it was normal. No change in plans. ( she had hit her head and was having headaches.)

## 2013-09-01 NOTE — Telephone Encounter (Signed)
Pt notified and verbalized understanding. What do you want her to do with neck pain, slept wrong and headaches.

## 2013-09-01 NOTE — Telephone Encounter (Signed)
Message copied by Baltazar Apo on Mon Sep 01, 2013 11:33 AM ------      Message from: Ileana Ladd      Created: Thu Aug 28, 2013  9:27 PM       Call patient.      CT head normal.      No change in plan. ------

## 2013-09-01 NOTE — Telephone Encounter (Signed)
See note to dr Modesto Charon.

## 2013-09-02 NOTE — Telephone Encounter (Signed)
Office visit for follow up

## 2013-09-02 NOTE — Telephone Encounter (Signed)
Left message for patient to call and setup appointment to discuss ongoing symptoms.

## 2013-09-22 ENCOUNTER — Telehealth: Payer: Self-pay | Admitting: Family Medicine

## 2013-09-22 NOTE — Telephone Encounter (Signed)
i would prefer to send her to PT. Clelia Trabucco P. Jacelyn Grip, M.D.

## 2013-09-23 ENCOUNTER — Telehealth: Payer: Self-pay | Admitting: Family Medicine

## 2013-09-24 NOTE — Telephone Encounter (Signed)
Pt was advised to see PT not chiropractor by your previous note for neck pain.. What can you RX for headaches until sets up PT appt?

## 2013-09-25 ENCOUNTER — Other Ambulatory Visit: Payer: Self-pay | Admitting: Family Medicine

## 2013-09-25 DIAGNOSIS — R519 Headache, unspecified: Secondary | ICD-10-CM

## 2013-09-25 DIAGNOSIS — R51 Headache: Principal | ICD-10-CM

## 2013-09-25 DIAGNOSIS — G8929 Other chronic pain: Secondary | ICD-10-CM

## 2013-09-25 DIAGNOSIS — M542 Cervicalgia: Secondary | ICD-10-CM

## 2013-09-25 NOTE — Telephone Encounter (Signed)
Pt notified and per Dr Jacelyn Grip will need to be referred to headache clinic

## 2013-09-25 NOTE — Telephone Encounter (Signed)
referred to the headache center

## 2013-10-02 ENCOUNTER — Encounter (INDEPENDENT_AMBULATORY_CARE_PROVIDER_SITE_OTHER): Payer: Self-pay

## 2013-10-02 ENCOUNTER — Ambulatory Visit (INDEPENDENT_AMBULATORY_CARE_PROVIDER_SITE_OTHER): Payer: BC Managed Care – PPO | Admitting: Family Medicine

## 2013-10-02 ENCOUNTER — Encounter: Payer: Self-pay | Admitting: Family Medicine

## 2013-10-02 VITALS — BP 106/53 | HR 80 | Temp 97.0°F | Ht 61.0 in | Wt 140.2 lb

## 2013-10-02 DIAGNOSIS — Z119 Encounter for screening for infectious and parasitic diseases, unspecified: Secondary | ICD-10-CM | POA: Insufficient documentation

## 2013-10-02 DIAGNOSIS — F4320 Adjustment disorder, unspecified: Secondary | ICD-10-CM

## 2013-10-02 DIAGNOSIS — M47812 Spondylosis without myelopathy or radiculopathy, cervical region: Secondary | ICD-10-CM

## 2013-10-02 DIAGNOSIS — C50919 Malignant neoplasm of unspecified site of unspecified female breast: Secondary | ICD-10-CM

## 2013-10-02 DIAGNOSIS — Z13220 Encounter for screening for lipoid disorders: Secondary | ICD-10-CM

## 2013-10-02 DIAGNOSIS — Z0000 Encounter for general adult medical examination without abnormal findings: Secondary | ICD-10-CM | POA: Insufficient documentation

## 2013-10-02 DIAGNOSIS — M542 Cervicalgia: Secondary | ICD-10-CM

## 2013-10-02 DIAGNOSIS — F418 Other specified anxiety disorders: Secondary | ICD-10-CM

## 2013-10-02 NOTE — Patient Instructions (Addendum)
HEALTH MAINTENANCE Immunizations: Tetanus-Diphtheria Booster due:06/24/2021 Pertusis Booster [phone] Flu Shot Due: every Fall Pneumonia Vaccine: usually at 55 years of age unless there are certain risk situations. Herpes Zoster/Shingles Vaccine due: usually at 55 years of age HPV OFB:PZWC age 45 to 35 years in males and females.  Healthy Life Habits: Exercise Goal: 5-6 days/week; start gradually(ie 30 minutes/3days per week) Nutrition: Balanced healthy meals including Vegetables and Fruits. Consider  Reading the following books: 1) Eat to Live by Dr Diona Browner; 2) Prevent and Reverse Heart Disease by Dr Karl Luke.  Vitamins:okay to take  A multivitamin Aspirin:n/a Stop Tobacco Use:n/a Seat Belt Use:+++ recommended Sunscreen Use:+++ recommended Osteoporosis Prevention: 1) Exercise 2) Calcium/Vitamin D requirements:[facility] recommends:    Calcium:  800 mg/day for children 51-51 years of age          53 mg/day for children 30-68 years of age          62 mg/day for adults 61-24 years of age          18 mg/day for everyone more than 55 years of age     Vitamin D: 800 IU per day or as prescribed if you are deficient.  Recommended Screening Tests: Colon Cancer Screening:2021 Blood work: ASAP Cholesterol Screening:  ASAP          HIV:      n/a              Hepatitis C(people born 1945-1965):today  Mammogram:with Dr Runell Gess DEXA/Bone Density: with Dr Runell Gess GYN Exam: with Dr Runell Gess Monthly Self Breast Exam:+++   Eye Exam: every 1 to 2 years recommended Dental Health: at least every 6 months  Others:    Living Will/Healthcare Power of Attorney: should have this in order with your personal estate planning

## 2013-10-02 NOTE — Progress Notes (Signed)
Patient ID: Mandy Vargas, female   DOB: October 31, 1960, 53 y.o.   MRN: [id number] SUBJECTIVE: CC: Chief Complaint  Patient presents with  . Annual Exam    sees Dr Eustaquio Boyden for pap. has had recent mammogram over summer  . 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 Wears glasses   . Bruises easily   . Depression   . Palpitations     On Inderal - Dr. Modesto Charon  . Anemia   . Breast cancer     Left - DCIS  . Chronic kidney disease     kidney stone removed  . Nephrolithiasis     Status post lithotripsy   Past Surgical History  Procedure Laterality Date  . Dilation and curettage of uterus  1993  . Kidney stone surgery    . Breast biopsy  12/22/11    DCIS w/calcifications  . Breast lumpectomy  02/19/12    Left breast, DCIS, ER/PR+   History   Social History  . Marital Status: Single    Spouse Name: N/A    Number of Children: N/A  . Years of Education: N/A   Occupational History  . Not on file.   Social History Main Topics  . Smoking status: Former Smoker -- 0.25 packs/day for 1 years    Types: Cigarettes    Quit date: 09/12/2011  . Smokeless tobacco: Not on file  . Alcohol Use: No  . Drug Use: No  . Sexual Activity: Yes   Other Topics Concern  . 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 Colon cancer Mother   . Breast cancer Sister   . Cancer Sister 50    Breast  . Colon cancer Paternal Grandmother     51'S  . Anesthesia problems Neg Hx   . Cancer Maternal Aunt     Breast   Current Outpatient Prescriptions on File Prior to Visit  Medication Sig Dispense Refill  . buPROPion (WELLBUTRIN XL) 300 MG 24 hr tablet Take 300 mg by mouth once.      . diazepam (VALIUM) 2 MG tablet Take 2 mg by mouth as needed. For  pain      . escitalopram (LEXAPRO) 20 MG tablet Take 2 tablets (40 mg total) by mouth daily.  60 tablet  11  . Magnesium 250 MG TABS Take 1 tablet by mouth every evening.      . metoprolol tartrate (LOPRESSOR) 25 MG tablet Take 1 tablet (25 mg total) by mouth 2 (two) times daily.  60 tablet  5  . Multiple Vitamin (MULTIVITAMIN) tablet Take 1 tablet by mouth daily.      . tamoxifen (NOLVADEX) 20 MG tablet Take 1 tablet (20 mg total) by mouth daily.  90 tablet  12  . fluticasone (FLONASE) 50 MCG/ACT nasal spray Place 2 sprays into the nose daily.  16 g  6   No current facility-administered medications on file prior to visit.   No Known Allergies Immunization History  Administered Date(s) Administered  . Tdap 09/11/2009   Prior to Admission medications   Medication Sig Start Date End Date Taking? Authorizing Provider  buPROPion (WELLBUTRIN XL) 300 MG 24 hr tablet Take 300 mg by  mouth once. 04/24/13  Yes Vernie Shanks, MD  diazepam (VALIUM) 2 MG tablet Take 2 mg by mouth as needed. For pain 01/11/12  Yes Historical Provider, MD  escitalopram (LEXAPRO) 20 MG tablet Take 2 tablets (40 mg total) by mouth daily. 07/18/13  Yes Lysbeth Penner, FNP  Magnesium 250 MG TABS Take 1 tablet by mouth every evening.   Yes Historical Provider, MD  metoprolol tartrate (LOPRESSOR) 25 MG tablet Take 1 tablet (25 mg total) by mouth 2 (two) times daily. 04/24/13  Yes Vernie Shanks, MD  Multiple Vitamin (MULTIVITAMIN) tablet Take 1 tablet by mouth daily.   Yes Historical Provider, MD  tamoxifen (NOLVADEX) 20 MG tablet Take 1 tablet (20 mg total) by mouth daily. 01/13/13  Yes Deatra Robinson, MD  fluticasone (FLONASE) 50 MCG/ACT nasal spray Place 2 sprays into the nose daily. 05/27/13   Vernie Shanks, MD     ROS: As above in the HPI. All other systems are stable or negative.  OBJECTIVE: APPEARANCE:  Patient in no acute distress.The patient appeared well nourished and normally developed. Acyanotic. Waist: VITAL  SIGNS:BP 106/53  Pulse 80  Temp(Src) 97 F (36.1 C) (Oral)  Ht $R'5\' 1"'CV$  (1.549 m)  Wt 140 lb 3.2 oz (63.594 kg)  BMI 26.50 kg/m2  LMP 12/13/2011 WF   SKIN: warm and  Dry without overt rashes, tattoos and scars  HEAD and Neck: without JVD, Head and scalp: normal Eyes:No scleral icterus. Fundi normal, eye movements normal. Ears: Auricle normal, canal normal, Tympanic membranes normal, insufflation normal. Nose: normal Throat: normal Neck & thyroid: normal  CHEST & LUNGS: Chest wall: normal Lungs: Clear  CVS: Reveals the PMI to be normally located. Regular rhythm, First and Second Heart sounds are normal,  absence of murmurs, rubs or gallops. Peripheral vasculature: Radial pulses: normal Dorsal pedis pulses: normal Posterior pulses: normal  ABDOMEN:  Appearance: normal Benign, no organomegaly, no masses, no Abdominal Aortic enlargement. No Guarding , no rebound. No Bruits. Bowel sounds: normal  RECTAL: N/A GU: N/A  EXTREMETIES: nonedematous.  MUSCULOSKELETAL:  Spine: normal Joints: intact  NEUROLOGIC: oriented to time,place and person; nonfocal. Strength is normal Sensory is normal Reflexes are normal Cranial Nerves are normal.  ASSESSMENT:   Annual physical exam - Plan: POCT CBC, CMP14+EGFR  Breast cancer - Plan: POCT CBC  Adjustment reaction  Anxiety associated with depression - Plan: TSH  Neck pain - Plan: Ambulatory referral to Physical Therapy  Arthritis of neck - Plan: Ambulatory referral to Physical Therapy  Screening cholesterol level - Plan: Lipid panel  Screening examination for infectious disease - Plan: Hepatitis C antibody  PLAN:      HEALTH MAINTENANCE Immunizations: Tetanus-Diphtheria Booster due:06/24/2021 Pertusis Booster [id number] Flu Shot Due: every Fall Pneumonia Vaccine: usually at 55 years of age unless there are certain risk situations. Herpes Zoster/Shingles Vaccine due: usually at 55 years of age HPV CNO:BSJG age 70 to  59 years in males and females.  Healthy Life Habits: Exercise Goal: 5-6 days/week; start gradually(ie 30 minutes/3days per week) Nutrition: Balanced healthy meals including Vegetables and Fruits. Consider  Reading the following books: 1) Eat to Live by Dr Diona Browner; 2) Prevent and Reverse Heart Disease by Dr Karl Luke.  Vitamins:okay to take  A multivitamin Aspirin:n/a Stop Tobacco Use:n/a Seat Belt Use:+++ recommended Sunscreen Use:+++ recommended Osteoporosis Prevention: 1) Exercise 2) Calcium/Vitamin D requirements:[facility] [place] recommends:    Calcium:  800 mg/day for children  17-59 years of age          45 mg/day for children 58-44 years of age          22 mg/day for adults 77-36 years of age          60 mg/day for everyone more than 55 years of age     Vitamin D: 800 IU per day or as prescribed if you are deficient.  Recommended Screening Tests: Colon Cancer Screening:2021 Blood work: ASAP Cholesterol Screening:  ASAP          HIV:      n/a              Hepatitis C(people born 1945-1965):today  Mammogram:with Dr Runell Gess DEXA/Bone Density: with Dr Runell Gess GYN Exam: with Dr Runell Gess Monthly Self Breast Exam:+++   Eye Exam: every 1 to 2 years recommended Dental Health: at least every 6 months  Others:    Living Will/Healthcare Power of Attorney: should have this in order with your personal estate planning    Orders Placed This Encounter  Procedures  . CMP14+EGFR    Standing Status: Future     Number of Occurrences:      Standing Expiration Date: 10/02/2014    Order Specific Question:  Has the patient fasted?    Answer:  Yes  . TSH    Standing Status: Future     Number of Occurrences:      Standing Expiration Date: 10/02/2014  . Lipid panel    Standing Status: Future     Number of Occurrences:      Standing Expiration Date: 10/02/2014    Order Specific Question:  Has the patient fasted?    Answer:   Yes  . Hepatitis C antibody    Standing Status: Future     Number of Occurrences:      Standing Expiration Date: 10/02/2014  . Ambulatory referral to Physical Therapy    Referral Priority:  Routine    Referral Type:  Physical Medicine    Referral Reason:  Specialty Services Required    Requested Specialty:  Physical Therapy    Number of Visits Requested:  1  . POCT CBC    Standing Status: Future     Number of Occurrences:      Standing Expiration Date: 11/02/2013   No orders of the defined types were placed in this encounter.   There are no discontinued medications. Return pending labs. DMV forms to be filled.  Denita Lun P. Jacelyn Grip, M.D.

## 2013-10-08 ENCOUNTER — Telehealth: Payer: Self-pay | Admitting: Family Medicine

## 2013-10-09 ENCOUNTER — Other Ambulatory Visit (INDEPENDENT_AMBULATORY_CARE_PROVIDER_SITE_OTHER): Payer: BC Managed Care – PPO

## 2013-10-09 DIAGNOSIS — Z119 Encounter for screening for infectious and parasitic diseases, unspecified: Secondary | ICD-10-CM

## 2013-10-09 DIAGNOSIS — Z13220 Encounter for screening for lipoid disorders: Secondary | ICD-10-CM

## 2013-10-09 DIAGNOSIS — Z0000 Encounter for general adult medical examination without abnormal findings: Secondary | ICD-10-CM

## 2013-10-09 DIAGNOSIS — F418 Other specified anxiety disorders: Secondary | ICD-10-CM

## 2013-10-09 DIAGNOSIS — C50919 Malignant neoplasm of unspecified site of unspecified female breast: Secondary | ICD-10-CM

## 2013-10-09 LAB — POCT CBC
Granulocyte percent: 67.7 %G (ref 37–80)
HCT, POC: 37.7 % (ref 37.7–47.9)
Hemoglobin: 12.6 g/dL (ref 12.2–16.2)
Lymph, poc: 1.4 (ref 0.6–3.4)
MCH, POC: 30.5 pg (ref 27–31.2)
MCHC: 33.4 g/dL (ref 31.8–35.4)
MCV: 91.1 fL (ref 80–97)
MPV: 8.6 fL (ref 0–99.8)
POC Granulocyte: 3.2 (ref 2–6.9)
POC LYMPH PERCENT: 29.6 %L (ref 10–50)
Platelet Count, POC: 149 10*3/uL (ref 142–424)
RBC: 4.1 M/uL (ref 4.04–5.48)
RDW, POC: 12.4 %
WBC: 4.7 10*3/uL (ref 4.6–10.2)

## 2013-10-09 NOTE — Telephone Encounter (Signed)
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

## 2013-10-09 NOTE — Telephone Encounter (Signed)
Done per Tiffany Kocher

## 2013-10-09 NOTE — Telephone Encounter (Signed)
Pt aware will call her when papers are completed. Pt verbalized understanidng .

## 2013-10-10 LAB — LIPID PANEL
Chol/HDL Ratio: 3.5 ratio units (ref 0.0–4.4)
Cholesterol, Total: 262 mg/dL — ABNORMAL HIGH (ref 100–199)
HDL: 75 mg/dL (ref >39)
LDL Calculated: 165 mg/dL — ABNORMAL HIGH (ref 0–99)
Triglycerides: 111 mg/dL (ref 0–149)
VLDL Cholesterol Cal: 22 mg/dL (ref 5–40)

## 2013-10-10 LAB — CMP14+EGFR
ALT: 20 IU/L (ref 0–32)
AST: 19 IU/L (ref 0–40)
Albumin/Globulin Ratio: 1.7 (ref 1.1–2.5)
Albumin: 4.2 g/dL (ref 3.5–5.5)
Alkaline Phosphatase: 51 IU/L (ref 39–117)
BUN/Creatinine Ratio: 35 — ABNORMAL HIGH (ref 9–23)
BUN: 28 mg/dL — ABNORMAL HIGH (ref 6–24)
CO2: 26 mmol/L (ref 18–29)
Calcium: 9.4 mg/dL (ref 8.7–10.2)
Chloride: 103 mmol/L (ref 97–108)
Creatinine, Ser: 0.81 mg/dL (ref 0.57–1.00)
GFR calc Af Amer: 95 mL/min/{1.73_m2} (ref >59)
GFR calc non Af Amer: 82 mL/min/{1.73_m2} (ref >59)
Globulin, Total: 2.5 g/dL (ref 1.5–4.5)
Glucose: 96 mg/dL (ref 65–99)
Potassium: 4.9 mmol/L (ref 3.5–5.2)
Sodium: 141 mmol/L (ref 134–144)
Total Bilirubin: 0.2 mg/dL (ref 0.0–1.2)
Total Protein: 6.7 g/dL (ref 6.0–8.5)

## 2013-10-10 LAB — TSH: TSH: 1.73 u[IU]/mL (ref 0.450–4.500)

## 2013-10-10 LAB — HEPATITIS C ANTIBODY: Hep C Virus Ab: <0.1 s/co ratio (ref 0.0–0.9)

## 2013-10-22 ENCOUNTER — Other Ambulatory Visit: Payer: Self-pay | Admitting: Obstetrics and Gynecology

## 2013-11-07 ENCOUNTER — Telehealth: Payer: Self-pay | Admitting: Family Medicine

## 2013-11-07 NOTE — Telephone Encounter (Signed)
Spoke with pt and she is aware maximum is 20 mg and she wondered if she could take 2 tablets . Pt advised again the maximum dose was 20mg  . Pt verbalized understanding.

## 2013-11-07 NOTE — Telephone Encounter (Signed)
Done pt aware °

## 2013-11-07 NOTE — Telephone Encounter (Signed)
Lexapro maximum is 20 mg! If she needs more medications . She may need to see a psychiatrist for medication changes if not responding. She will need to self  Refer. Thanks. Pattijo Juste P. Jacelyn Grip, M.D.

## 2013-11-24 ENCOUNTER — Other Ambulatory Visit: Payer: Self-pay | Admitting: Family Medicine

## 2013-11-24 ENCOUNTER — Telehealth: Payer: Self-pay | Admitting: Family Medicine

## 2013-11-24 DIAGNOSIS — M542 Cervicalgia: Secondary | ICD-10-CM

## 2013-11-24 NOTE — Telephone Encounter (Signed)
Referral done in EPIC.

## 2013-11-24 NOTE — Telephone Encounter (Signed)
appt given for in the am  

## 2013-11-25 ENCOUNTER — Ambulatory Visit: Payer: BC Managed Care – PPO | Admitting: Family Medicine

## 2013-11-26 ENCOUNTER — Ambulatory Visit (INDEPENDENT_AMBULATORY_CARE_PROVIDER_SITE_OTHER): Payer: BC Managed Care – PPO | Admitting: Family Medicine

## 2013-11-26 ENCOUNTER — Encounter: Payer: Self-pay | Admitting: Family Medicine

## 2013-11-26 VITALS — BP 98/55 | HR 69 | Temp 98.1°F | Ht 61.0 in | Wt 136.6 lb

## 2013-11-26 DIAGNOSIS — J329 Chronic sinusitis, unspecified: Secondary | ICD-10-CM

## 2013-11-26 DIAGNOSIS — M542 Cervicalgia: Secondary | ICD-10-CM

## 2013-11-26 MED ORDER — KETOROLAC TROMETHAMINE 30 MG/ML IJ SOLN
30.0000 mg | Freq: Once | INTRAMUSCULAR | Status: AC
Start: 2013-11-26 — End: 2013-11-26
  Administered 2013-11-26: 30 mg via INTRAMUSCULAR

## 2013-11-26 MED ORDER — AMOXICILLIN 875 MG PO TABS: 875.0000 mg | ORAL_TABLET | Freq: Two times a day (BID) | ORAL | Status: DC

## 2013-11-26 NOTE — Progress Notes (Signed)
   Subjective:    Patient ID: Mandy Vargas, female    DOB: Apr 25, 1961, 53 y.o.   MRN: [id number]  HPI  This 53 y.o. female presents for evaluation of uri sx's and neck discomfort for a few days.  Review of Systems No chest pain, SOB, HA, dizziness, vision change, N/V, diarrhea, constipation, dysuria, urinary urgency or frequency, myalgias, arthralgias or rash.     Objective:   Physical Exam Vital signs noted  Well developed well nourished female.  HEENT - Head atraumatic Normocephalic                Eyes - PERRLA, Conjuctiva - clear Sclera- Clear EOMI                Ears - EAC's Wnl TM's Wnl Gross Hearing WNL                Nose - Nares patent                 Throat - oropharanx wnl Respiratory - Lungs CTA bilateral Cardiac - RRR S1 and S2 without murmur GI - Abdomen soft Nontender and bowel sounds active x 4 MS - TTP cervical paraspinous muscles      Assessment & Plan:  Sinusitis - Plan: amoxicillin (AMOXIL) 875 MG tablet po bid x 10 days Push po fluids, rest, tylenol and motrin otc prn as directed for fever, arthralgias, and myalgias.  Follow up prn if sx's continue or persist.  Cervicalgia - Plan: ketorolac (TORADOL) 30 MG/ML injection 30 mg Take motrin 200mg  2 to 3 po tid x one week Follow up with PT  Lysbeth Penner FNP

## 2013-11-27 ENCOUNTER — Other Ambulatory Visit: Payer: Self-pay | Admitting: Obstetrics and Gynecology

## 2013-12-01 ENCOUNTER — Telehealth: Payer: Self-pay | Admitting: Family Medicine

## 2013-12-02 ENCOUNTER — Ambulatory Visit: Payer: BC Managed Care – PPO | Admitting: Physical Therapy

## 2013-12-02 NOTE — Telephone Encounter (Signed)
Follow up.

## 2013-12-03 NOTE — Telephone Encounter (Signed)
Lm- bill states she needs a f/u appt

## 2013-12-08 ENCOUNTER — Other Ambulatory Visit: Payer: Self-pay | Admitting: Family Medicine

## 2013-12-09 NOTE — Telephone Encounter (Signed)
Unable to reach patient by phone after multiple attempts.

## 2013-12-11 ENCOUNTER — Ambulatory Visit (INDEPENDENT_AMBULATORY_CARE_PROVIDER_SITE_OTHER): Payer: BC Managed Care – PPO | Admitting: Family Medicine

## 2013-12-11 ENCOUNTER — Encounter: Payer: Self-pay | Admitting: Family Medicine

## 2013-12-11 VITALS — BP 99/61 | HR 76 | Temp 98.7°F | Ht 61.0 in | Wt 138.0 lb

## 2013-12-11 DIAGNOSIS — J069 Acute upper respiratory infection, unspecified: Secondary | ICD-10-CM

## 2013-12-11 DIAGNOSIS — M542 Cervicalgia: Secondary | ICD-10-CM

## 2013-12-11 MED ORDER — METHYLPREDNISOLONE (PAK) 4 MG PO TABS
ORAL_TABLET | ORAL | Status: DC
Start: 2013-12-11 — End: 2014-02-09

## 2013-12-11 MED ORDER — LEVOFLOXACIN 500 MG PO TABS: 500.0000 mg | ORAL_TABLET | Freq: Every day | ORAL | Status: DC

## 2013-12-11 MED ORDER — KETOROLAC TROMETHAMINE 60 MG/2ML IM SOLN
30.0000 mg | Freq: Once | INTRAMUSCULAR | Status: AC
  Administered 2013-12-11: 30 mg via INTRAMUSCULAR

## 2013-12-11 MED ORDER — CYCLOBENZAPRINE HCL 5 MG PO TABS: 5.0000 mg | ORAL_TABLET | Freq: Three times a day (TID) | ORAL | Status: DC | PRN

## 2013-12-11 NOTE — Progress Notes (Signed)
   Subjective:    Patient ID: SALEENA TAMAS, female    DOB: [birth year], 53 y.o.   MRN: [id number]  HPI This 53 y.o. female presents for evaluation of left neck discomfort.  She states she feels Like she slept on her neck wrong and now it hurts.  She was given toradol shot and it helped For 24 hours and now it is back.  She has been taking motrin and it is not helping.  She is having Some uri sx's.   Review of Systems C/o neck pain No chest pain, SOB, HA, dizziness, vision change, N/V, diarrhea, constipation, dysuria, urinary urgency or frequency, myalgias, arthralgias or rash.     Objective:   Physical Exam  Vital signs noted  Well developed well nourished female.  HEENT - Head atraumatic Normocephalic Respiratory - Lungs CTA bilateral Cardiac - RRR S1 and S2 without murmur MS - TTP left cervical paraspinous muscles      Assessment & Plan:  Cervicalgia - Plan: methylPREDNIsolone (MEDROL DOSPACK) 4 MG tablet, levofloxacin (LEVAQUIN) 500 MG tablet, cyclobenzaprine (FLEXERIL) 5 MG tablet, ketorolac (TORADOL) injection 30 mg  URI (upper respiratory infection) - Plan: methylPREDNIsolone (MEDROL DOSPACK) 4 MG tablet, levofloxacin (LEVAQUIN) 500 MG tablet  Push po fluids, rest, tylenol and motrin otc prn as directed for fever, arthralgias, and myalgias.  Follow up prn if sx's continue or persist.  Lysbeth Penner FNP

## 2014-01-15 ENCOUNTER — Telehealth: Payer: Self-pay | Admitting: Family Medicine

## 2014-01-15 NOTE — Telephone Encounter (Signed)
DMV forms are scanned into media. She has contacted her cardiologist to see if they can write her a letter explaining that her palpitations do not pose a problem for her driving.  She may need a copy of this form but will call back if so.

## 2014-01-16 ENCOUNTER — Other Ambulatory Visit: Payer: Self-pay | Admitting: Family Medicine

## 2014-01-18 ENCOUNTER — Other Ambulatory Visit: Payer: Self-pay | Admitting: Oncology

## 2014-01-18 DIAGNOSIS — C50919 Malignant neoplasm of unspecified site of unspecified female breast: Secondary | ICD-10-CM

## 2014-01-19 ENCOUNTER — Other Ambulatory Visit: Payer: Self-pay | Admitting: *Deleted

## 2014-01-19 DIAGNOSIS — C50919 Malignant neoplasm of unspecified site of unspecified female breast: Secondary | ICD-10-CM

## 2014-01-20 ENCOUNTER — Telehealth: Payer: Self-pay | Admitting: Adult Health

## 2014-01-20 NOTE — Telephone Encounter (Signed)
per pof to sch pt appt-cld & tlwd w/pt to advise time & date for the appt-will mail copy of sch °

## 2014-01-21 ENCOUNTER — Ambulatory Visit: Payer: BC Managed Care – PPO | Admitting: Cardiology

## 2014-01-21 ENCOUNTER — Encounter: Payer: Self-pay | Admitting: Family Medicine

## 2014-01-21 ENCOUNTER — Ambulatory Visit (INDEPENDENT_AMBULATORY_CARE_PROVIDER_SITE_OTHER): Payer: BC Managed Care – PPO | Admitting: Family Medicine

## 2014-01-21 VITALS — BP 98/58 | HR 69 | Temp 98.1°F | Ht 61.0 in | Wt 139.0 lb

## 2014-01-21 DIAGNOSIS — R002 Palpitations: Secondary | ICD-10-CM

## 2014-01-21 DIAGNOSIS — M542 Cervicalgia: Secondary | ICD-10-CM

## 2014-01-21 MED ORDER — METOPROLOL TARTRATE 25 MG PO TABS: 25.0000 mg | ORAL_TABLET | Freq: Two times a day (BID) | ORAL | Status: DC

## 2014-01-21 MED ORDER — BUPROPION HCL ER (XL) 300 MG PO TB24: 300.0000 mg | ORAL_TABLET | Freq: Every day | ORAL | Status: DC

## 2014-01-21 MED ORDER — KETOROLAC TROMETHAMINE 30 MG/ML IJ SOLN: 30.0000 mg | Freq: Once | INTRAMUSCULAR | Status: AC

## 2014-01-21 MED ORDER — CITALOPRAM HYDROBROMIDE 20 MG PO TABS: 20.0000 mg | ORAL_TABLET | Freq: Every day | ORAL | Status: DC

## 2014-01-21 MED ORDER — HYDROCODONE-ACETAMINOPHEN 5-325 MG PO TABS: 1.0000 | ORAL_TABLET | Freq: Four times a day (QID) | ORAL | Status: DC | PRN

## 2014-01-21 MED ORDER — KETOROLAC TROMETHAMINE 30 MG/ML IJ SOLN
30.0000 mg | Freq: Once | INTRAMUSCULAR | Status: AC
  Administered 2014-01-21: 30 mg via INTRAMUSCULAR

## 2014-01-21 NOTE — Progress Notes (Signed)
   Subjective:    Patient ID: Mandy Vargas, female    DOB: Jan 18, 1961, 53 y.o.   MRN: [id number]  HPI This 53 y.o. female presents for evaluation of neck pain.  She is taking flexeril and this isn't working And she is taking NSAIDS and they are not helping.   Review of Systems    No chest pain, SOB, HA, dizziness, vision change, N/V, diarrhea, constipation, dysuria, urinary urgency or frequency, myalgias, arthralgias or rash.  Objective:   Physical Exam Vital signs noted  Well developed well nourished female.  HEENT - Head atraumatic Normocephalic                Eyes - PERRLA, Conjuctiva - clear Sclera- Clear EOMI                Ears - EAC's Wnl TM's Wnl Gross Hearing WNL                Throat - oropharanx wnl Respiratory - Lungs CTA bilateral Cardiac - RRR S1 and S2 without murmur GI - Abdomen soft Nontender and bowel sounds active x 4 Extremities - No edema. Neuro - Grossly intact. MS - Tenderness right cervical paraspinous muscles.  Decreased ROM cervical spine      Assessment & Plan:  Cervicalgia - Plan: HYDROcodone-acetaminophen (NORCO) 5-325 MG per tablet, ketorolac (TORADOL) 30 MG/ML injection 30 mg  Palpitations - Plan: metoprolol tartrate (LOPRESSOR) 25 MG tablet  Lysbeth Penner FNP

## 2014-02-09 ENCOUNTER — Encounter: Payer: Self-pay | Admitting: Cardiology

## 2014-02-09 ENCOUNTER — Ambulatory Visit (INDEPENDENT_AMBULATORY_CARE_PROVIDER_SITE_OTHER): Payer: BC Managed Care – PPO | Admitting: Cardiology

## 2014-02-09 VITALS — BP 100/64 | HR 76 | Ht 61.0 in | Wt 140.8 lb

## 2014-02-09 DIAGNOSIS — R002 Palpitations: Secondary | ICD-10-CM

## 2014-02-09 NOTE — Patient Instructions (Signed)
Your physician recommends that you schedule a follow-up appointment in: as needed. Your physician recommends that you continue on your current medications as directed. Please refer to the Current Medication list given to you today. Please have the cardiology form section sent to our office so that your cardiologist can complete that portion of your form.

## 2014-02-09 NOTE — Assessment & Plan Note (Signed)
Resolved, patient reports no palpitations over the last few years, tolerating low-dose Lopressor which is followed by her primary care provider. ECG today is normal. She has no previously documented history of cardiac arrhythmia, and has had no dizziness or syncope. 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 Keep followup with primary care, and return if any symptoms of palpitations occur that might require further evaluation. Forwarding copy of this office note to patient's primary care provider and also the DMV.

## 2014-02-09 NOTE — Progress Notes (Signed)
Clinical Summary Mandy Vargas is a 53 y.o.female referred back to the office, last seen in November 2013 with a history of palpitations, although no specifically defined cardiac arrhythmias. She continues to follow at [facility]. She drives a school bus, and received a letter from the [facility] asking for clarification regarding her history of palpitations as it relates to any potential impairment and driving.  Based on available information, Mandy Vargas has had no clearly defined history of cardiac arrhythmias. She tells me that she has been asymptomatic since I last saw her, no palpitations, no dizziness, no chest pain, no syncope. She reports no specific functional limitations. Beta blocker now is in the form of Lopressor which she has been tolerating well.  Lab work from January showed hemoglobin 12.6, platelets 149, BUN 28, creatinine 0.8, potassium 4.9, TSH 1.7, LDL 165.  ECG today shows normal sinus rhythm.   No Known Allergies  Current Outpatient Prescriptions  Medication Sig Dispense Refill  . buPROPion (WELLBUTRIN XL) 300 MG 24 hr tablet Take 1 tablet (300 mg total) by mouth daily.  30 tablet  2  . citalopram (CELEXA) 20 MG tablet Take 1 tablet (20 mg total) by mouth daily.  30 tablet  3  . diazepam (VALIUM) 2 MG tablet Take 2 mg by mouth as needed. For pain      . metoprolol tartrate (LOPRESSOR) 25 MG tablet Take 1 tablet (25 mg total) by mouth 2 (two) times daily.  60 tablet  5  . Multiple Vitamin (MULTIVITAMIN) tablet Take 1 tablet by mouth daily.      . tamoxifen (NOLVADEX) 20 MG tablet TAKE 1 TABLET (20 MG TOTAL) BY MOUTH DAILY.  30 tablet  0   No current facility-administered medications for this visit.    Past Medical History  Diagnosis Date  . Wears glasses   . Bruises easily   . Depression   . Palpitations   . Anemia   . Breast cancer     Left - DCIS  . Nephrolithiasis     Status post lithotripsy    Past Surgical History  Procedure  Laterality Date  . Dilation and curettage of uterus  1993  . Kidney stone surgery    . Breast biopsy  12/22/11    DCIS w/calcifications  . Breast lumpectomy  02/19/12    Left breast, DCIS, ER/PR+    Family History  Problem Relation Age of Onset  . Colon cancer Mother   . Breast cancer Sister   . Cancer Sister 7    Breast  . Colon cancer Paternal Grandmother     62'S  . Anesthesia problems Neg Hx   . Cancer Maternal Aunt     Breast    Social History Ms. Dalton reports that she quit smoking about 2 years ago. Her smoking use included Cigarettes. She has a .25 pack-year smoking history. She does not have any smokeless tobacco history on file. Mandy Vargas reports that she does not drink alcohol.  Review of Systems Negative except as outlined above.  Physical Examination Filed Vitals:   02/09/14 0930  BP: 100/64  Pulse: 76   Filed Weights   02/09/14 0930  Weight: 140 lb 12.8 oz (63.866 kg)   Normally nourished appearing woman, comfortable at rest. HEENT: Conjunctiva and lids normal, oropharynx clear. Neck: Supple, no elevated JVP or carotid bruits, no thyromegaly. Lungs: Clear to auscultation, nonlabored breathing at rest. Cardiac: Regular rate and rhythm, no S3 or significant systolic murmur,  no pericardial rub. Abdomen: Soft, nontender, bowel sounds present, no guarding or rebound. Extremities: No pitting edema, distal pulses 2+. Skin: Warm and dry. Musculoskeletal: No kyphosis. Neuropsychiatric: Alert and oriented x3, affect grossly appropriate.   Problem List and Plan   Palpitations Resolved, patient reports no palpitations over the last few years, tolerating low-dose Lopressor which is followed by her primary care provider. ECG today is normal. She has no previously documented history of cardiac arrhythmia, and has had no dizziness or syncope. 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 Keep followup with  primary care, and return if any symptoms of palpitations occur that might require further evaluation. Forwarding copy of this office note to patient's primary care provider and also the DMV.    Satira Sark, M.D., F.A.C.C.

## 2014-02-11 ENCOUNTER — Telehealth: Payer: Self-pay | Admitting: Adult Health

## 2014-02-11 NOTE — Telephone Encounter (Signed)
, °

## 2014-02-17 ENCOUNTER — Other Ambulatory Visit: Payer: Self-pay | Admitting: Oncology

## 2014-02-20 ENCOUNTER — Ambulatory Visit: Payer: BC Managed Care – PPO | Admitting: Adult Health

## 2014-02-20 ENCOUNTER — Other Ambulatory Visit: Payer: BC Managed Care – PPO

## 2014-03-03 ENCOUNTER — Telehealth: Payer: Self-pay | Admitting: Adult Health

## 2014-03-03 ENCOUNTER — Other Ambulatory Visit: Payer: Self-pay | Admitting: Physician Assistant

## 2014-03-03 NOTE — Telephone Encounter (Signed)
S/W PT TO ADVISE APPT 6/30 HAS BEEN MOVED PER LC REQ. GAVE PT NEW APPT FOR 7/30 @ 2.45PM.

## 2014-03-10 ENCOUNTER — Other Ambulatory Visit: Payer: BC Managed Care – PPO

## 2014-03-10 ENCOUNTER — Ambulatory Visit: Payer: BC Managed Care – PPO | Admitting: Adult Health

## 2014-03-20 ENCOUNTER — Other Ambulatory Visit: Payer: Self-pay | Admitting: Obstetrics and Gynecology

## 2014-03-20 DIAGNOSIS — Z853 Personal history of malignant neoplasm of breast: Secondary | ICD-10-CM

## 2014-03-27 ENCOUNTER — Other Ambulatory Visit: Payer: Self-pay | Admitting: *Deleted

## 2014-03-27 DIAGNOSIS — C50919 Malignant neoplasm of unspecified site of unspecified female breast: Secondary | ICD-10-CM

## 2014-03-27 MED ORDER — TAMOXIFEN CITRATE 20 MG PO TABS: ORAL_TABLET | ORAL | Status: DC

## 2014-04-08 ENCOUNTER — Other Ambulatory Visit: Payer: Self-pay | Admitting: *Deleted

## 2014-04-08 ENCOUNTER — Telehealth: Payer: Self-pay | Admitting: Adult Health

## 2014-04-08 DIAGNOSIS — C50912 Malignant neoplasm of unspecified site of left female breast: Secondary | ICD-10-CM

## 2014-04-08 NOTE — Telephone Encounter (Signed)
pt cld back & stated could not come 7/31 but could come 7/30-sch w/cp2 on 7/30-pt understood time & date

## 2014-04-08 NOTE — Telephone Encounter (Signed)
per pof to r/s pt appt to 7/31 per [place] is aware of the appt chge per [facility]. Pt to keep lab & CC on 7/30

## 2014-04-09 ENCOUNTER — Other Ambulatory Visit: Payer: Self-pay | Admitting: *Deleted

## 2014-04-09 ENCOUNTER — Encounter: Payer: Self-pay | Admitting: Hematology

## 2014-04-09 ENCOUNTER — Ambulatory Visit: Payer: BC Managed Care – PPO | Admitting: Adult Health

## 2014-04-09 ENCOUNTER — Other Ambulatory Visit (HOSPITAL_BASED_OUTPATIENT_CLINIC_OR_DEPARTMENT_OTHER): Payer: BC Managed Care – PPO

## 2014-04-09 ENCOUNTER — Ambulatory Visit (HOSPITAL_BASED_OUTPATIENT_CLINIC_OR_DEPARTMENT_OTHER): Payer: BC Managed Care – PPO | Admitting: Hematology

## 2014-04-09 ENCOUNTER — Other Ambulatory Visit: Payer: BC Managed Care – PPO

## 2014-04-09 VITALS — BP 120/61 | HR 67 | Temp 98.8°F | Resp 18 | Ht 61.0 in | Wt 140.7 lb

## 2014-04-09 DIAGNOSIS — D0510 Intraductal carcinoma in situ of unspecified breast: Secondary | ICD-10-CM

## 2014-04-09 DIAGNOSIS — D0590 Unspecified type of carcinoma in situ of unspecified breast: Secondary | ICD-10-CM

## 2014-04-09 DIAGNOSIS — Z170 Estrogen receptor positive status [ER+]: Secondary | ICD-10-CM

## 2014-04-09 DIAGNOSIS — C50919 Malignant neoplasm of unspecified site of unspecified female breast: Secondary | ICD-10-CM

## 2014-04-09 DIAGNOSIS — D0512 Intraductal carcinoma in situ of left breast: Secondary | ICD-10-CM

## 2014-04-09 DIAGNOSIS — C50912 Malignant neoplasm of unspecified site of left female breast: Secondary | ICD-10-CM

## 2014-04-09 LAB — CBC WITH DIFFERENTIAL/PLATELET
BASO%: 0.3 % (ref 0.0–2.0)
Basophils Absolute: 0 10*3/uL (ref 0.0–0.1)
EOS ABS: 0.1 10*3/uL (ref 0.0–0.5)
EOS%: 1.3 % (ref 0.0–7.0)
HCT: 38.3 % (ref 34.8–46.6)
HGB: 12.4 g/dL (ref 11.6–15.9)
LYMPH#: 1.5 10*3/uL (ref 0.9–3.3)
LYMPH%: 27.5 % (ref 14.0–49.7)
MCH: 29.9 pg (ref 25.1–34.0)
MCHC: 32.4 g/dL (ref 31.5–36.0)
MCV: 92.2 fL (ref 79.5–101.0)
MONO#: 0.4 10*3/uL (ref 0.1–0.9)
MONO%: 6.9 % (ref 0.0–14.0)
NEUT%: 64 % (ref 38.4–76.8)
NEUTROS ABS: 3.6 10*3/uL (ref 1.5–6.5)
Platelets: 170 10*3/uL (ref 145–400)
RBC: 4.16 10*6/uL (ref 3.70–5.45)
RDW: 12.5 % (ref 11.2–14.5)
WBC: 5.6 10*3/uL (ref 3.9–10.3)

## 2014-04-09 LAB — COMPREHENSIVE METABOLIC PANEL (CC13)
ALBUMIN: 3.5 g/dL (ref 3.5–5.0)
ALT: 17 U/L (ref 0–55)
AST: 23 U/L (ref 5–34)
Alkaline Phosphatase: 50 U/L (ref 40–150)
Anion Gap: 6 mEq/L (ref 3–11)
BUN: 18.7 mg/dL (ref 7.0–26.0)
CO2: 29 mEq/L (ref 22–29)
Calcium: 9.4 mg/dL (ref 8.4–10.4)
Chloride: 103 mEq/L (ref 98–109)
Creatinine: 0.8 mg/dL (ref 0.6–1.1)
GLUCOSE: 84 mg/dL (ref 70–140)
POTASSIUM: 4.2 meq/L (ref 3.5–5.1)
SODIUM: 138 meq/L (ref 136–145)
TOTAL PROTEIN: 6.7 g/dL (ref 6.4–8.3)
Total Bilirubin: 0.22 mg/dL (ref 0.20–1.20)

## 2014-04-09 MED ORDER — TAMOXIFEN CITRATE 20 MG PO TABS: ORAL_TABLET | ORAL | Status: DC

## 2014-04-09 NOTE — Progress Notes (Signed)
OFFICE PROGRESS NOTE  CC Dr. Alphonsa Overall Dr. Thea Silversmith  DIAGNOSIS: 53 year old female with diagnosis of ductal carcinoma in situ (DCIS) for f/u visit.  PRIOR THERAPY:   #1 Patient was originally seen in the multidisciplinary breast clinic spent she has gone on to have a lumpectomy of the left breast 02/19/2012 that showed a 0.3 cm focus of DCIS with the tumor found to be ER +100% PR +100%, intermediate grade.  #2 Patient received her radiation in [place] and completed in October 2013.  #3 Patient had genetic testing done that was negative for BRCA 2 and 1 gene mutations.  #4 Patient began tamoxifen 20 mg daily in October 2013. Overall she's doing well with this she has no problems. She will continue this for 5 years.  CURRENT THERAPY:Tamoxifen 20 mg daily  INTERVAL HISTORY:  Mandy Vargas 53 y.o. female returns for follow up.  She has been doing well.  Thus far she has tolerated tamoxifen except for hot flashes. She has no nausea no vomiting no abdominal pain no headaches double vision blurring of vision she has no vaginal bleeding or discharge she has not noticed any swelling in her lower extremities. Patient is experiencing some of the classic side effects from tamoxifen including hot flashes, vasomotor changes, vaginal dryness, decrease in libido, memory problems. She also stated that she had a Pap smear done with Dr. Dian Queen on 10/22/2013 who subsequently did an endometrial biopsy on 11/27/2013 which was negative for any atypical cells or malignancy. Patient does have a sister with a diagnosis of breast cancer. She lives up [address] [place] in [place] which is a 35 minute drive. Patient is compliant with tamoxifen. The the 2 big issues we are facing with tamoxifen are #1 memory problems and fogginess of the brain #2 changes in her endometrium which could be a tamoxifen phenomenon as well. If these 2 problems do not subside, I will strongly consider switching her to Arimidex or Aromasin.  Both these drugs are now approved for DCIS. I did ask her to start taking some over-the-counter vitamin B12 which can help her memory as well as nerve function. There are studies showing that after tamoxifen is stopped, lot of these symptoms will get better Mandy Vargas. Mandy Vargas et al Psycho-oncology Volume 21, pages [phone], January 2012 "Cognitive limitations associated with tamoxifen and aromatase inhibitors in employed breast cancer survivors"). Patient also had some headaches and underwent a cat scan brain on 08/25/2013 which I reviewed and was normal imaged below and a C-spine xray was fine also.          Remainder of the 10 point review of systems is negative.    MEDICAL HISTORY: Past Medical History  Diagnosis Date  . Wears glasses   . Bruises easily   . Depression   . Palpitations   . Anemia   . Breast cancer     Left - DCIS  . Nephrolithiasis     Status post lithotripsy    ALLERGIES:  has No Known Allergies.  MEDICATIONS:  Current Outpatient Prescriptions  Medication Sig Dispense Refill  . buPROPion (WELLBUTRIN XL) 300 MG 24 hr tablet Take 1 tablet (300 mg total) by mouth daily.  30 tablet  2  . citalopram (CELEXA) 20 MG tablet Take 1 tablet (20 mg total) by mouth daily.  30 tablet  3  . diazepam (VALIUM) 2 MG tablet Take 2 mg by mouth as needed. For pain      . metoprolol tartrate (LOPRESSOR) 25  MG tablet Take 1 tablet (25 mg total) by mouth 2 (two) times daily.  60 tablet  5  . Multiple Vitamin (MULTIVITAMIN) tablet Take 1 tablet by mouth daily.      . tamoxifen (NOLVADEX) 20 MG tablet TAKE 1 TABLET (20 MG TOTAL) BY MOUTH DAILY.  90 tablet  3   No current facility-administered medications for this visit.    SURGICAL HISTORY:  Past Surgical History  Procedure Laterality Date  . Dilation and curettage of uterus  1993  . Kidney stone surgery    . Breast biopsy  12/22/11    DCIS w/calcifications  . Breast lumpectomy  02/19/12    Left breast, DCIS, ER/PR+     REVIEW OF SYSTEMS:   General: fatigue (-), night sweats (-), fever (-), pain (-), Fogginess in brain and memory problems Lymph: palpable nodes (-) HEENT: vision changes (-), mucositis (-), gum bleeding (-), epistaxis (-) Cardiovascular: chest pain (-), palpitations (-) Pulmonary: shortness of breath (-), dyspnea on exertion (-), cough (-), hemoptysis (-) GI:  Early satiety (-), melena (-), dysphagia (-), nausea/vomiting (-), diarrhea (-) GU: dysuria (-), hematuria (-), incontinence (-) endometrial biopsy for abnormal pap was ok.+vaginal dryness Musculoskeletal: joint swelling (-), joint pain (-), back pain (-) Neuro: weakness (-), numbness (-), headache (-), confusion (-) Skin: Rash (-), lesions (-), dryness (-) Psych: depression (-), suicidal/homicidal ideation (-), feeling of hopelessness (-) hot flashes+  Health Maintenance  Mammogram: 02/06/2013 normal  Colonoscopy: 2011 Bone Density Scan: 2010-normal Pap Smear: 2014 reportedly normal Eye Exam: 08/2011 Vitamin D Level: n/a Lipid Panel: 10/09/2013 chol 262, LDL 165, HDL 75, TG 111    PHYSICAL EXAMINATION:  BP 120/61  Pulse 67  Temp(Src) 98.8 F (37.1 C) (Oral)  Resp 18  Ht $R'5\' 1"'aM$  (1.549 m)  Wt 140 lb 11.2 oz (63.821 kg)  BMI 26.60 kg/m2  LMP 12/13/2011 General: Patient is a well appearing female in no acute distress HEENT: PERRLA, sclerae anicteric no conjunctival pallor, MMM Neck: supple, no palpable adenopathy Lungs: clear to auscultation bilaterally, no wheezes, rhonchi, or rales Cardiovascular: regular rate rhythm, S1, S2, no murmurs, rubs or gallops Abdomen: Soft, non-tender, non-distended, normoactive bowel sounds, no HSM Extremities: warm and well perfused, no clubbing, cyanosis, or edema Skin: No rashes or lesions Neuro: Non-focal Bilateral breast examination left breast reveals a healed incisional scar no masses or nipple discharge. Right breast no masses or nipple discharge. ECOG PERFORMANCE STATUS: 0 -  Asymptomatic  LABORATORY DATA: Lab Results  Component Value Date   WBC 5.6 04/09/2014   HGB 12.4 04/09/2014   HCT 38.3 04/09/2014   MCV 92.2 04/09/2014   PLT 170 04/09/2014      Chemistry      Component Value Date/Time   NA 138 04/09/2014 1520   NA 141 10/09/2013 0808   NA 136 02/14/2012 1141   K 4.2 04/09/2014 1520   K 4.9 10/09/2013 0808   CL 103 10/09/2013 0808   CL 103 01/13/2013 1428   CO2 29 04/09/2014 1520   CO2 26 10/09/2013 0808   BUN 18.7 04/09/2014 1520   BUN 28* 10/09/2013 0808   BUN 25* 02/14/2012 1141   CREATININE 0.8 04/09/2014 1520   CREATININE 0.81 10/09/2013 0808      Component Value Date/Time   CALCIUM 9.4 04/09/2014 1520   CALCIUM 9.4 10/09/2013 0808   ALKPHOS 50 04/09/2014 1520   ALKPHOS 51 10/09/2013 0808   AST 23 04/09/2014 1520   AST 19 10/09/2013 0808  ALT 17 04/09/2014 1520   ALT 20 10/09/2013 0808   BILITOT 0.22 04/09/2014 1520   BILITOT 0.2 10/09/2013 0940      PATHOLOGY DATA FROM RECENT PAP AND ENDOMETRIAL BIOPSY:       ASSESSMENT: 53 year old BRCA Negative female from [place] for f/u regarding her diagnosis of DCIS   Left breast.   #1 stage 0 (DCIS measuring 0.3 cm) tumor was ER/PR positive, intermediate grade. Patient is status post lumpectomy in June 2013 by Dr Lucia Gaskins. Overall she tolerated the surgery well.  She then went on to receive radiation therapy. She has tolerated it well. In October 2013 she was begun on tamoxifen 20 mg as chemoprevention. She did have some endometrial abnormality prompting a biopsy which came back negative for malignancy in March 2015 by Dr Helane Rima. She also is having some neurocognitive problems with Tamoxifen which are reported in literature and usually gets better once we stop the pill.I asked her to start taking a baby aspirin for thromboprophylaxis and to reduce risk for DVT, TIA, MI and CVA.   PLAN:  #1 Continue tamoxifen 20 mg for now. If symptoms get worse neurocognitive or any gynecological problems, will switch to  Aromasin or Arimidex which are now both approved for DCIS indication. If she stays on Tamoxifen she will have to take it through October 2018. Her physical exam and breast exam was normal today. She is getting her mammogram on April 13 2014.  #2 Start taking vitamin B12 1000 mcg daily to help memory and preserve neuronal function.  #3 Reports from Dr Helane Rima office reviewed. Recommend for her to monitor patient closely and if there is any concern we can switch her from Tamoxifen to a different agent.  #4 RTC in 6 months  All questions were answered. The patient knows to call the clinic with any problems, questions or concerns. We can certainly see the patient much sooner if necessary.  I spent 25 minutes counseling the patient face to face. The total time spent in the appointment was 30 minutes  Brylei Pedley Lona Kettle, MD Medical Hematologist/Oncologist [place] Pager: [phone] Office No: [phone]  04/09/2014, 8:19 PM

## 2014-04-10 ENCOUNTER — Ambulatory Visit: Payer: BC Managed Care – PPO

## 2014-04-10 ENCOUNTER — Other Ambulatory Visit: Payer: BC Managed Care – PPO

## 2014-04-10 ENCOUNTER — Telehealth: Payer: Self-pay | Admitting: Hematology

## 2014-04-10 NOTE — Telephone Encounter (Signed)
lvm for pt regarding to Jan 2016 appt....mailed pt appt sched/avs and letter °

## 2014-04-16 ENCOUNTER — Encounter: Payer: Self-pay | Admitting: Family

## 2014-04-16 ENCOUNTER — Telehealth: Payer: Self-pay | Admitting: Family

## 2014-04-16 ENCOUNTER — Ambulatory Visit (INDEPENDENT_AMBULATORY_CARE_PROVIDER_SITE_OTHER): Payer: BC Managed Care – PPO | Admitting: Family

## 2014-04-16 VITALS — BP 105/56 | HR 76 | Temp 98.8°F | Ht 61.0 in | Wt 141.2 lb

## 2014-04-16 DIAGNOSIS — G44229 Chronic tension-type headache, not intractable: Secondary | ICD-10-CM

## 2014-04-16 MED ORDER — TIZANIDINE HCL 4 MG PO TABS: 4.0000 mg | ORAL_TABLET | Freq: Four times a day (QID) | ORAL | Status: DC | PRN

## 2014-04-16 MED ORDER — AMITRIPTYLINE HCL 10 MG PO TABS: 10.0000 mg | ORAL_TABLET | Freq: Every day | ORAL | Status: DC

## 2014-04-16 NOTE — Patient Instructions (Signed)

## 2014-04-16 NOTE — Progress Notes (Signed)
   Subjective:    Patient ID: Mandy Vargas, female    DOB: August 31, 1961, 53 y.o.   MRN: [id number]  Headache  This is a recurrent problem. The current episode started more than 1 month ago. The problem occurs intermittently. The problem has been waxing and waning. The pain is located in the temporal and bilateral region. The pain does not radiate. The pain quality is similar to prior headaches. The quality of the pain is described as aching and dull. The pain is at a severity of 5/10. The pain is mild. Pertinent negatives include no abdominal pain, blurred vision, coughing, drainage, ear pain, eye pain, fever, phonophobia, photophobia, rhinorrhea or sinus pressure. The symptoms are aggravated by activity. She has tried NSAIDs for the symptoms. The treatment provided mild relief. There is no history of migraine headaches or migraines in the family.      Review of Systems  Constitutional: Negative.  Negative for fever.  HENT: Negative for ear pain, rhinorrhea and sinus pressure.   Eyes: Negative.  Negative for blurred vision, photophobia and pain.  Respiratory: Negative.  Negative for cough and shortness of breath.   Cardiovascular: Negative.  Negative for palpitations.  Gastrointestinal: Negative.  Negative for abdominal pain.  Endocrine: Negative.   Genitourinary: Negative.   Musculoskeletal: Negative.   Neurological: Positive for headaches.  Hematological: Negative.   Psychiatric/Behavioral: Negative.   All other systems reviewed and are negative.      Objective:   Physical Exam  Vitals reviewed. Constitutional: She is oriented to person, place, and time. She appears well-developed and well-nourished. No distress.  HENT:  Head: Normocephalic and atraumatic.  Right Ear: External ear normal.  Left Ear: External ear normal.  Nose: Nose normal.  Mouth/Throat: Oropharynx is clear and moist.  Eyes: Pupils are equal, round, and reactive to light.  Neck: Normal range of motion. Neck  supple. No thyromegaly present.  Cardiovascular: Normal rate, regular rhythm, normal heart sounds and intact distal pulses.   No murmur heard. Pulmonary/Chest: Effort normal and breath sounds normal. No respiratory distress. She has no wheezes.  Abdominal: Soft. Bowel sounds are normal. She exhibits no distension. There is no tenderness.  Musculoskeletal: Normal range of motion. She exhibits no edema and no tenderness.  Neurological: She is alert and oriented to person, place, and time. She has normal reflexes. No cranial nerve deficit.  Skin: Skin is warm and dry.  Psychiatric: She has a normal mood and affect. Her behavior is normal. Judgment and thought content normal.      BP 105/56  Pulse 76  Temp(Src) 98.8 F (37.1 C) (Oral)  Ht 5\' 1"  (1.549 m)  Wt 141 lb 3.2 oz (64.048 kg)  BMI 26.69 kg/m2  LMP 12/13/2011     Assessment & Plan:  1. Chronic tension-type headache, not intractable -Stress management -Discussed medication may cause drowsiness -RTO in 3 weeks - amitriptyline (ELAVIL) 10 MG tablet; Take 1 tablet (10 mg total) by mouth at bedtime.  Dispense: 30 tablet; Refill: 2 - tiZANidine (ZANAFLEX) 4 MG tablet; Take 1 tablet (4 mg total) by mouth every 6 (six) hours as needed for muscle spasms.  Dispense: 30 tablet; Refill: [place], FNP

## 2014-04-17 NOTE — Telephone Encounter (Signed)
Unsure of who called and why. Will forward to provider to see if she knows.

## 2014-04-22 ENCOUNTER — Ambulatory Visit
Admission: RE | Admit: 2014-04-22 | Discharge: 2014-04-22 | Disposition: A | Discharge status: Home / Self Care | Payer: BC Managed Care – PPO | Source: Ambulatory Visit | Attending: Obstetrics and Gynecology | Admitting: Obstetrics and Gynecology

## 2014-04-22 DIAGNOSIS — Z853 Personal history of malignant neoplasm of breast: Secondary | ICD-10-CM

## 2014-04-29 ENCOUNTER — Other Ambulatory Visit: Payer: Self-pay | Admitting: Family

## 2014-04-29 NOTE — Progress Notes (Unsigned)
Received notification from pt's insurance stating she is overdue for mammogram. Pt needs to have one scheduled per the recommendations of the [employer] every 12 months.

## 2014-05-08 ENCOUNTER — Other Ambulatory Visit: Payer: Self-pay | Admitting: Family Medicine

## 2014-06-03 ENCOUNTER — Other Ambulatory Visit: Payer: Self-pay | Admitting: Family Medicine

## 2014-06-07 ENCOUNTER — Other Ambulatory Visit: Payer: Self-pay | Admitting: Family Medicine

## 2014-07-13 ENCOUNTER — Encounter: Payer: Self-pay | Admitting: Family

## 2014-08-10 ENCOUNTER — Telehealth: Payer: Self-pay | Admitting: Family Medicine

## 2014-08-10 NOTE — Telephone Encounter (Signed)
error 

## 2014-09-29 ENCOUNTER — Telehealth: Payer: Self-pay | Admitting: Hematology and Oncology

## 2014-09-29 NOTE — Telephone Encounter (Signed)
, °

## 2014-10-09 ENCOUNTER — Ambulatory Visit: Payer: BC Managed Care – PPO | Admitting: Hematology and Oncology

## 2014-10-09 ENCOUNTER — Ambulatory Visit: Payer: BC Managed Care – PPO

## 2014-10-09 ENCOUNTER — Other Ambulatory Visit: Payer: BC Managed Care – PPO

## 2014-10-15 ENCOUNTER — Telehealth: Payer: Self-pay | Admitting: Hematology and Oncology

## 2014-10-15 NOTE — Telephone Encounter (Signed)
returned pt call re combining lab and f/u appts. s/w pt and per pt due to other conflicting doctor appts and not wanting to take too much time off from work feb appts moved to april. pt given new appt for lb/VG 4/13 @ 2:30pm.

## 2014-10-20 ENCOUNTER — Other Ambulatory Visit: Payer: BC Managed Care – PPO

## 2014-10-22 ENCOUNTER — Other Ambulatory Visit: Payer: BC Managed Care – PPO

## 2014-10-22 ENCOUNTER — Ambulatory Visit: Payer: BC Managed Care – PPO | Admitting: Hematology and Oncology

## 2014-11-10 ENCOUNTER — Other Ambulatory Visit: Payer: Self-pay | Admitting: Obstetrics and Gynecology

## 2014-11-10 ENCOUNTER — Institutional Professional Consult (permissible substitution): Payer: BC Managed Care – PPO | Admitting: Pulmonary Disease

## 2014-11-11 LAB — CYTOLOGY - PAP

## 2014-12-17 ENCOUNTER — Telehealth: Payer: Self-pay | Admitting: Hematology and Oncology

## 2014-12-17 NOTE — Telephone Encounter (Signed)
Patient called in to reschedule her appointment °

## 2014-12-23 ENCOUNTER — Other Ambulatory Visit: Payer: BC Managed Care – PPO

## 2014-12-23 ENCOUNTER — Ambulatory Visit: Payer: BC Managed Care – PPO | Admitting: Hematology and Oncology

## 2015-01-19 ENCOUNTER — Other Ambulatory Visit: Payer: Self-pay | Admitting: *Deleted

## 2015-01-19 DIAGNOSIS — C50919 Malignant neoplasm of unspecified site of unspecified female breast: Secondary | ICD-10-CM

## 2015-01-20 ENCOUNTER — Ambulatory Visit: Payer: BC Managed Care – PPO | Admitting: Hematology and Oncology

## 2015-01-20 ENCOUNTER — Other Ambulatory Visit: Payer: BC Managed Care – PPO

## 2015-01-20 NOTE — Assessment & Plan Note (Signed)
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 Hot flashes 2. Vasomotor symptoms 3. Vaginal dryness and decreased libido 4. Memory problems  Breast Cancer Surveillance: 1. Breast exam 01/20/2015: Normal 2. Mammogram 04/22/2014 No abnormalities. Postsurgical changes. Breast Density Category C. I recommended that she get 3-D mammograms for surveillance. Discussed the differences between different breast density categories.  Return to clinic in 1 year for follow-up

## 2015-02-01 ENCOUNTER — Other Ambulatory Visit: Payer: Self-pay

## 2015-02-02 ENCOUNTER — Telehealth: Payer: Self-pay | Admitting: Hematology and Oncology

## 2015-02-02 NOTE — Telephone Encounter (Signed)
Spoke with patient and she is aware of her missed appointment and will call back to reschedule  Mandy Vargas

## 2015-04-06 ENCOUNTER — Other Ambulatory Visit: Payer: Self-pay | Admitting: Obstetrics and Gynecology

## 2015-04-06 ENCOUNTER — Other Ambulatory Visit: Payer: Self-pay

## 2015-04-06 DIAGNOSIS — Z853 Personal history of malignant neoplasm of breast: Secondary | ICD-10-CM

## 2015-04-28 ENCOUNTER — Other Ambulatory Visit: Payer: Self-pay | Admitting: *Deleted

## 2015-04-28 ENCOUNTER — Ambulatory Visit
Admission: RE | Admit: 2015-04-28 | Discharge: 2015-04-28 | Disposition: A | Discharge status: Home / Self Care | Payer: BC Managed Care – PPO | Source: Ambulatory Visit | Attending: Obstetrics and Gynecology | Admitting: Obstetrics and Gynecology

## 2015-04-28 ENCOUNTER — Telehealth: Payer: Self-pay | Admitting: *Deleted

## 2015-04-28 ENCOUNTER — Telehealth: Payer: Self-pay | Admitting: Hematology and Oncology

## 2015-04-28 DIAGNOSIS — C50912 Malignant neoplasm of unspecified site of left female breast: Secondary | ICD-10-CM

## 2015-04-28 DIAGNOSIS — Z853 Personal history of malignant neoplasm of breast: Secondary | ICD-10-CM

## 2015-04-28 MED ORDER — TAMOXIFEN CITRATE 20 MG PO TABS: ORAL_TABLET | ORAL | Status: DC

## 2015-04-28 NOTE — Telephone Encounter (Signed)
Called patient after voicemail received requesting name of doctor she is re-assigned to and that Tamoxifen is going to run out in a week.  Called patient notifying her we've been awaiting a call to reschedule F/u she missed in May.  Will refill times one and send P.O.F. To scheduler to reschedule lab and F/U  From 01-20-2015.  Request an appointment after 3:30 pm as school has started.

## 2015-04-28 NOTE — Telephone Encounter (Signed)
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 Geralyn Flash last apt for the day is NP at 3:45, mailed out schedule and advised pt to c/b to r/s if she can't do this schedule.Marland Kitchen KJ

## 2015-05-02 ENCOUNTER — Other Ambulatory Visit: Payer: Self-pay | Admitting: Hematology

## 2015-05-13 ENCOUNTER — Encounter (INDEPENDENT_AMBULATORY_CARE_PROVIDER_SITE_OTHER): Payer: Self-pay

## 2015-05-13 ENCOUNTER — Ambulatory Visit (INDEPENDENT_AMBULATORY_CARE_PROVIDER_SITE_OTHER): Payer: BC Managed Care – PPO | Admitting: Pediatrics

## 2015-05-13 ENCOUNTER — Encounter: Payer: Self-pay | Admitting: Pediatrics

## 2015-05-13 VITALS — BP 108/68 | HR 72 | Temp 98.2°F | Ht 61.0 in | Wt 139.0 lb

## 2015-05-13 DIAGNOSIS — S0990XS Unspecified injury of head, sequela: Secondary | ICD-10-CM

## 2015-05-13 DIAGNOSIS — G44309 Post-traumatic headache, unspecified, not intractable: Secondary | ICD-10-CM

## 2015-05-13 DIAGNOSIS — J309 Allergic rhinitis, unspecified: Secondary | ICD-10-CM | POA: Insufficient documentation

## 2015-05-13 NOTE — Patient Instructions (Addendum)
Flonase--1 spray in the morning and night  Ceterizine--as needed for allergies, take by mouth

## 2015-05-13 NOTE — Progress Notes (Signed)
Subjective:    Patient ID: Mandy Vargas, female    DOB: 1961/04/13, 54 y.o.   MRN: [id number]  HPI: Mandy Vargas is a 54 y.o. female presenting on 05/13/2015 for Ear Pain  Has had headaches for a while. She thinks she has fluid in her ear. She has been using claritin nose spray. HA worse when stressed out. No fevers. Has had congestion.   Across temples pain. Worst in the morning. Present most days. Worse since school started this week. Works with 4 yos. H/o of head injury. Ibuprofen and tylenol do not help. Excedrin helps some.   Relevant past medical, surgical, family and social history reviewed and updated as indicated. Interim medical history since our last visit reviewed. Allergies and medications reviewed and updated.  Review of Systems  Constitutional: Negative for fever, chills and weight loss.  HENT: Positive for congestion. Negative for ear discharge, hearing loss and sore throat.   Eyes: Negative.   Respiratory: Negative.   Cardiovascular: Negative.   Gastrointestinal: Negative.   Musculoskeletal: Negative.   Skin: Negative for rash.  Neurological: Positive for headaches.    ROS: Per HPI unless specifically indicated above  Past Medical History Patient Active Problem List   Diagnosis Date Noted  . Arthritis of neck 10/02/2013  . Annual physical exam 10/02/2013  . Screening examination for infectious disease 10/02/2013  . Screening cholesterol level 10/02/2013  . Contusion of head 08/25/2013  . Neck pain 08/25/2013  . 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 Skin tag 04/24/2013  . Adjustment reaction 04/24/2013  . Breast cancer, left breast   . 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 Palpitations 11/28/2010  . Kidney stones 11/28/2010  . Anxiety associated with depression 11/28/2010    Current Outpatient Prescriptions  Medication Sig Dispense Refill  . buPROPion (WELLBUTRIN XL) 300 MG 24 hr tablet TAKE 1 TABLET (300 MG  TOTAL) BY MOUTH DAILY. 30 tablet 2  . diazepam (VALIUM) 2 MG tablet Take 2 mg by mouth as needed. For pain    . FLUoxetine (PROZAC) 10 MG capsule Take 10 mg by mouth daily.    . metoprolol tartrate (LOPRESSOR) 25 MG tablet Take 1 tablet (25 mg total) by mouth 2 (two) times daily. 60 tablet 5  . Multiple Vitamin (MULTIVITAMIN) tablet Take 1 tablet by mouth daily.    . tamoxifen (NOLVADEX) 20 MG tablet TAKE 1 TABLET (20 MG TOTAL) BY MOUTH DAILY. 90 tablet 0  . tiZANidine (ZANAFLEX) 4 MG tablet Take 1 tablet (4 mg total) by mouth every 6 (six) hours as needed for muscle spasms. 30 tablet 1   No current facility-administered medications for this visit.       Objective:    BP 108/68 mmHg  Pulse 72  Temp(Src) 98.2 F (36.8 C) (Oral)  Ht 5\' 1"  (1.549 m)  Wt 139 lb (63.05 kg)  BMI 26.28 kg/m2  LMP 12/13/2011  Wt Readings from Last 3 Encounters:  05/13/15 139 lb (63.05 kg)  04/16/14 141 lb 3.2 oz (64.048 kg)  04/09/14 140 lb 11.2 oz (63.821 kg)    Gen: NAD, alert, cooperative with exam, NCAT EYES: EOMI, no scleral injection or icterus ENT:  TMs pearly gray b/l, OP without erythema, no effusion or fluid. LYMPH: no cervical LAD CV: NRRR, normal S1/S2, no murmur, DP pulses 2+ b/l Resp: CTABL, no wheezes, normal WOB Abd: +BS, soft, NTND. no guarding or organomegaly Ext: No edema, warm Neuro: Alert  and oriented, strength equal b/l UE and LE, coodrination grossly normal MSK: normal muscle bulk     Assessment & Plan:   54yoF with h/o depression, anxiety, here for fluid in her ears, no fluid seen. Also with HA, ongoing problem. H/o head injury. On wellbutrin and recently started (days ago) on prozac. Has tried zanaflex in past for HA. Given new start of meds, will not start other new prophylactic meds at this time. Allergies: use Flonase and cetirizine.  Follow up plan: Return in about 4 weeks (around 06/10/2015).  Assunta Found, MD [facility] 05/13/2015, 8:32  PM

## 2015-06-06 NOTE — Assessment & Plan Note (Signed)
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 Patient is status post lumpectomy in June 2013 by Dr Lucia Gaskins. In October 2013 she was begun on tamoxifen 20 mg as chemoprevention.  Tamoxifen Toxicities: 1. Neurocognitive side effects 2. Endometrial Biopsy march 2015: Normal  RTC in 1 year

## 2015-06-07 ENCOUNTER — Other Ambulatory Visit (HOSPITAL_BASED_OUTPATIENT_CLINIC_OR_DEPARTMENT_OTHER): Payer: BC Managed Care – PPO

## 2015-06-07 ENCOUNTER — Telehealth: Payer: Self-pay | Admitting: Hematology and Oncology

## 2015-06-07 ENCOUNTER — Encounter: Payer: Self-pay | Admitting: Hematology and Oncology

## 2015-06-07 ENCOUNTER — Ambulatory Visit (HOSPITAL_BASED_OUTPATIENT_CLINIC_OR_DEPARTMENT_OTHER): Payer: BC Managed Care – PPO | Admitting: Hematology and Oncology

## 2015-06-07 VITALS — BP 122/71 | HR 75 | Temp 98.5°F | Resp 18 | Ht 61.0 in | Wt 140.6 lb

## 2015-06-07 DIAGNOSIS — C50919 Malignant neoplasm of unspecified site of unspecified female breast: Secondary | ICD-10-CM | POA: Diagnosis not present

## 2015-06-07 DIAGNOSIS — C50912 Malignant neoplasm of unspecified site of left female breast: Secondary | ICD-10-CM | POA: Diagnosis not present

## 2015-06-07 LAB — CBC WITH DIFFERENTIAL/PLATELET
BASO%: 0.3 % (ref 0.0–2.0)
BASOS ABS: 0 10*3/uL (ref 0.0–0.1)
EOS ABS: 0.1 10*3/uL (ref 0.0–0.5)
EOS%: 1 % (ref 0.0–7.0)
HEMATOCRIT: 37.6 % (ref 34.8–46.6)
HEMOGLOBIN: 12.5 g/dL (ref 11.6–15.9)
LYMPH%: 24 % (ref 14.0–49.7)
MCH: 29.9 pg (ref 25.1–34.0)
MCHC: 33.1 g/dL (ref 31.5–36.0)
MCV: 90.3 fL (ref 79.5–101.0)
MONO#: 0.5 10*3/uL (ref 0.1–0.9)
MONO%: 6.7 % (ref 0.0–14.0)
NEUT%: 68 % (ref 38.4–76.8)
NEUTROS ABS: 5 10*3/uL (ref 1.5–6.5)
PLATELETS: 181 10*3/uL (ref 145–400)
RBC: 4.17 10*6/uL (ref 3.70–5.45)
RDW: 12.9 % (ref 11.2–14.5)
WBC: 7.3 10*3/uL (ref 3.9–10.3)
lymph#: 1.8 10*3/uL (ref 0.9–3.3)

## 2015-06-07 LAB — COMPREHENSIVE METABOLIC PANEL (CC13)
ALBUMIN: 3.5 g/dL (ref 3.5–5.0)
ALK PHOS: 55 U/L (ref 40–150)
ALT: 22 U/L (ref 0–55)
AST: 26 U/L (ref 5–34)
Anion Gap: 7 mEq/L (ref 3–11)
BUN: 25.9 mg/dL (ref 7.0–26.0)
CO2: 28 mEq/L (ref 22–29)
Calcium: 9.3 mg/dL (ref 8.4–10.4)
Chloride: 106 mEq/L (ref 98–109)
Creatinine: 1.1 mg/dL (ref 0.6–1.1)
EGFR: 55 mL/min/{1.73_m2} — AB (ref >90)
Glucose: 93 mg/dl (ref 70–140)
Potassium: 4 mEq/L (ref 3.5–5.1)
Sodium: 141 mEq/L (ref 136–145)
TOTAL PROTEIN: 6.6 g/dL (ref 6.4–8.3)

## 2015-06-07 NOTE — Telephone Encounter (Signed)
Gave avs & calendar for September 2017 °

## 2015-06-07 NOTE — Progress Notes (Signed)
Patient Care Team: Eustaquio Maize, MD as PCP - General (Pediatrics) Vernie Shanks, MD as Attending Physician (Family Medicine) Consuela Mimes, MD as Consulting Physician (Hematology and Oncology) Thea Silversmith, MD as Consulting Physician (Radiation Oncology) Dian Queen, MD as Consulting Physician (Obstetrics and Gynecology)  DIAGNOSIS: DCIS (ductal carcinoma in situ) of breast, Left.   Staging form: Breast, AJCC 7th Edition     Clinical: Stage 0 (Tis, N0, cM0) - Unsigned       Staging comments: Staged in Breast Conference 4.24.13      Pathologic: No stage assigned - Unsigned   SUMMARY OF ONCOLOGIC HISTORY:   Breast cancer, left breast   02/19/2012 Surgery left breast  lumpectomy: 0.3 cm focus of DCIS with the tumor found to be ER +100% PR +100%, intermediate grade   05/01/2012 - 06/21/2012 Radiation Therapy Adjuvant radiation in [place]    Procedure Negative for BRCA1 and 2 genes   06/21/2012 -  Anti-estrogen oral therapy Tamoxifen 20 mg daily 5 years    CHIEF COMPLIANT:  Follow-up on tamoxifen  INTERVAL HISTORY: Mandy Vargas is a  54 year old with above-mentioned history of left breast  DCIS currently on tamoxifen therapy. She has been on tamoxifen for the past 3 years. She is tolerating it extremely well. Occasionally has hot flashes. Denies any myalgias. Denies any night sweats. Denies any lumps or nodules in the breast. Last mammogram done in August was normal.  REVIEW OF SYSTEMS:   Constitutional: Denies fevers, chills or abnormal weight loss Eyes: Denies blurriness of vision Ears, nose, mouth, throat, and face: Denies mucositis or sore throat Respiratory: Denies cough, dyspnea or wheezes Cardiovascular: Denies palpitation, chest discomfort or lower extremity swelling Gastrointestinal:  Denies nausea, heartburn or change in bowel habits Skin: Denies abnormal skin rashes Lymphatics: Denies new lymphadenopathy or easy bruising Neurological:Denies numbness, tingling or new  weaknesses Behavioral/Psych: Mood is stable, no new changes  Breast:  denies any pain or lumps or nodules in either breasts All other systems were reviewed with the patient and are negative.  I have reviewed the past medical history, past surgical history, social history and family history with the patient and they are unchanged from previous note.  ALLERGIES:  has No Known Allergies.  MEDICATIONS:  Current Outpatient Prescriptions  Medication Sig Dispense Refill  . buPROPion (WELLBUTRIN XL) 300 MG 24 hr tablet TAKE 1 TABLET (300 MG TOTAL) BY MOUTH DAILY. 30 tablet 2  . diazepam (VALIUM) 2 MG tablet Take 2 mg by mouth as needed. For pain    . metoprolol tartrate (LOPRESSOR) 25 MG tablet Take 1 tablet (25 mg total) by mouth 2 (two) times daily. 60 tablet 5  . Multiple Vitamin (MULTIVITAMIN) tablet Take 1 tablet by mouth daily.    . sertraline (ZOLOFT) 100 MG tablet Take 100 mg by mouth daily.  1  . tamoxifen (NOLVADEX) 20 MG tablet TAKE 1 TABLET (20 MG TOTAL) BY MOUTH DAILY. 90 tablet 0  . tiZANidine (ZANAFLEX) 4 MG tablet Take 1 tablet (4 mg total) by mouth every 6 (six) hours as needed for muscle spasms. 30 tablet 1   No current facility-administered medications for this visit.    PHYSICAL EXAMINATION: ECOG PERFORMANCE STATUS: 0 - Asymptomatic  Filed Vitals:   06/07/15 1459  BP: 122/71  Pulse: 75  Temp: 98.5 F (36.9 C)  Resp: 18   Filed Weights   06/07/15 1459  Weight: 140 lb 9.6 oz (63.776 kg)    GENERAL:alert, no distress and comfortable SKIN:  skin color, texture, turgor are normal, no rashes or significant lesions EYES: normal, Conjunctiva are pink and non-injected, sclera clear OROPHARYNX:no exudate, no erythema and lips, buccal mucosa, and tongue normal  NECK: supple, thyroid normal size, non-tender, without nodularity LYMPH:  no palpable lymphadenopathy in the cervical, axillary or inguinal LUNGS: clear to auscultation and percussion with normal breathing  effort HEART: regular rate & rhythm and no murmurs and no lower extremity edema ABDOMEN:abdomen soft, non-tender and normal bowel sounds Musculoskeletal:no cyanosis of digits and no clubbing  NEURO: alert & oriented x 3 with fluent speech, no focal motor/sensory deficits BREAST: No palpable masses or nodules in either right or left breasts. No palpable axillary supraclavicular or infraclavicular adenopathy no breast tenderness or nipple discharge. (exam performed in the presence of a chaperone)  LABORATORY DATA:  I have reviewed the data as listed   Chemistry      Component Value Date/Time   NA 141 06/07/2015 1432   NA 141 10/09/2013 0808   NA 136 02/14/2012 1141   K 4.0 06/07/2015 1432   K 4.9 10/09/2013 0808   CL 103 10/09/2013 0808   CL 103 01/13/2013 1428   CO2 28 06/07/2015 1432   CO2 26 10/09/2013 0808   BUN 25.9 06/07/2015 1432   BUN 28* 10/09/2013 0808   BUN 25* 02/14/2012 1141   CREATININE 1.1 06/07/2015 1432   CREATININE 0.81 10/09/2013 0808      Component Value Date/Time   CALCIUM 9.3 06/07/2015 1432   CALCIUM 9.4 10/09/2013 0808   ALKPHOS 55 06/07/2015 1432   ALKPHOS 51 10/09/2013 0808   AST 26 06/07/2015 1432   AST 19 10/09/2013 0808   ALT 22 06/07/2015 1432   ALT 20 10/09/2013 0808   BILITOT <0.30 06/07/2015 1432   BILITOT 0.2 10/09/2013 0808       Lab Results  Component Value Date   WBC 7.3 06/07/2015   HGB 12.5 06/07/2015   HCT 37.6 06/07/2015   MCV 90.3 06/07/2015   PLT 181 06/07/2015   NEUTROABS 5.0 06/07/2015   ASSESSMENT & PLAN:  Breast cancer, left breast stage 0 (DCIS measuring 0.3 cm) tumor was ER/PR positive, intermediate grade. Patient is status post lumpectomy in June 2013 by Dr Lucia Gaskins. In October 2013 she was begun on tamoxifen 20 mg as chemoprevention.  Tamoxifen Toxicities: 1.  Memory problems 2. Endometrial Biopsy march 2015: Normal 3.  Hot flashes  Breast Cancer Surveillance: 1. Breast exam  06/07/2015: Normal  Apart from a  small skin lesion right breast  Lower outer quadrant , patient will see dermatologist for this 2. Mammogram  August 2016 No abnormalities. Postsurgical changes. Breast Density Category  C. I recommended that she get 3-D mammograms for surveillance. Discussed the differences between different breast density categories.   RTC in 1 year  No orders of the defined types were placed in this encounter.   The patient has a good understanding of the overall plan. she agrees with it. she will call with any problems that may develop before the next visit here.   Rulon Eisenmenger, MD

## 2015-06-10 ENCOUNTER — Other Ambulatory Visit: Payer: Self-pay | Admitting: *Deleted

## 2015-06-10 DIAGNOSIS — C50912 Malignant neoplasm of unspecified site of left female breast: Secondary | ICD-10-CM

## 2015-06-10 MED ORDER — TAMOXIFEN CITRATE 20 MG PO TABS: ORAL_TABLET | ORAL | Status: DC

## 2015-06-11 ENCOUNTER — Telehealth: Payer: Self-pay | Admitting: *Deleted

## 2015-06-11 NOTE — Telephone Encounter (Signed)
FYI.  [id number] voicemail retrieved at [phone], forwarded at [phone] to [phone]. "I saw Dr. Lindi Adie on Monday and need my cancer medication called in."

## 2015-06-14 ENCOUNTER — Telehealth: Payer: Self-pay | Admitting: *Deleted

## 2015-06-14 NOTE — Telephone Encounter (Signed)
Patient called reporting her Tamoxifen refill has not been called in.  This nurse observed eRx sent on 06-10-2015.  Confirmed pharmacy name and location.  [facility]  [facility] information on [facility] voicemail.

## 2015-06-28 ENCOUNTER — Other Ambulatory Visit: Payer: Self-pay | Admitting: Pediatrics

## 2015-06-28 DIAGNOSIS — F418 Other specified anxiety disorders: Secondary | ICD-10-CM

## 2015-06-28 DIAGNOSIS — R002 Palpitations: Secondary | ICD-10-CM

## 2015-06-28 MED ORDER — SERTRALINE HCL 100 MG PO TABS: 100.0000 mg | ORAL_TABLET | Freq: Every day | ORAL | Status: DC

## 2015-06-28 MED ORDER — BUPROPION HCL ER (XL) 300 MG PO TB24: ORAL_TABLET | ORAL | Status: DC

## 2015-06-28 MED ORDER — METOPROLOL TARTRATE 25 MG PO TABS: 25.0000 mg | ORAL_TABLET | Freq: Two times a day (BID) | ORAL | Status: DC

## 2015-06-28 NOTE — Telephone Encounter (Signed)
i did not take this message, the diazepam is 2mg , she has only seen you in awhile

## 2015-06-28 NOTE — Telephone Encounter (Signed)
I sent in refills for the metoprolol, sertraline, and buproprion. For the diazepam because it is a controlled substance and we have never filled it from [facility] she would need to come in for an appointment or she can get refills from whoever was filling it before.

## 2015-06-30 NOTE — Telephone Encounter (Signed)
Left message for pt informing of refills Pt will need to be seen for Diazepam refill

## 2015-07-01 ENCOUNTER — Encounter: Payer: Self-pay | Admitting: Pediatrics

## 2015-07-01 ENCOUNTER — Ambulatory Visit (INDEPENDENT_AMBULATORY_CARE_PROVIDER_SITE_OTHER): Payer: BC Managed Care – PPO | Admitting: Pediatrics

## 2015-07-01 ENCOUNTER — Telehealth: Payer: Self-pay | Admitting: Pediatrics

## 2015-07-01 VITALS — BP 107/69 | HR 75 | Temp 98.8°F | Ht 61.0 in | Wt 138.2 lb

## 2015-07-01 DIAGNOSIS — Z780 Asymptomatic menopausal state: Secondary | ICD-10-CM

## 2015-07-01 DIAGNOSIS — F418 Other specified anxiety disorders: Secondary | ICD-10-CM

## 2015-07-01 MED ORDER — DIAZEPAM 2 MG PO TABS: 2.0000 mg | ORAL_TABLET | Freq: Every evening | ORAL | Status: DC | PRN

## 2015-07-01 NOTE — Telephone Encounter (Signed)
Patient aware she will need to be seen and appointment given.

## 2015-07-01 NOTE — Progress Notes (Signed)
Subjective:    Patient ID: Mandy Vargas, female    DOB: [birth year], 54 y.o.   MRN: [id number]  CC: wants to know if she is in menopause.  HPI: Mandy Vargas is a 54 y.o. female presenting on 07/01/2015 for Wants labs drawn for menopause  LMP April 2013 Getting married in one week, Oct 29 Depression is well controlled with buproprion and sertraline Occasionally needs valium to help with sleep No cramping or vaginal bleeding. No headaches Some hot flashes Feeling well otherwise  Relevant past medical, surgical, family and social history reviewed and updated as indicated. Interim medical history since our last visit reviewed. Allergies and medications reviewed and updated.   ROS: Per HPI unless specifically indicated above  Past Medical History Patient Active Problem List   Diagnosis Date Noted  . Allergic rhinitis 05/13/2015  . Arthritis of neck ([place]) 10/02/2013  . Annual physical exam 10/02/2013  . Screening examination for infectious disease 10/02/2013  . Screening cholesterol level 10/02/2013  . Contusion of head 08/25/2013  . Neck pain 08/25/2013  . 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 Skin tag 04/24/2013  . Adjustment reaction 04/24/2013  . Breast cancer, left breast ([place])   . 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 Palpitations 11/28/2010  . Kidney stones 11/28/2010  . Anxiety associated with depression 11/28/2010    Current Outpatient Prescriptions  Medication Sig Dispense Refill  . buPROPion (WELLBUTRIN XL) 300 MG 24 hr tablet TAKE 1 TABLET (300 MG TOTAL) BY MOUTH DAILY. 30 tablet 5  . diazepam (VALIUM) 2 MG tablet Take 1 tablet (2 mg total) by mouth at bedtime as needed for anxiety. 30 tablet 1  . metoprolol tartrate (LOPRESSOR) 25 MG tablet Take 1 tablet (25 mg total) by mouth 2 (two) times daily. 60 tablet 5  . Multiple Vitamin (MULTIVITAMIN) tablet Take 1 tablet by mouth daily.    . sertraline (ZOLOFT) 100 MG tablet  Take 1 tablet (100 mg total) by mouth daily. 30 tablet 5  . tamoxifen (NOLVADEX) 20 MG tablet TAKE 1 TABLET (20 MG TOTAL) BY MOUTH DAILY. 90 tablet 3   No current facility-administered medications for this visit.       Objective:    BP 107/69 mmHg  Pulse 75  Temp(Src) 98.8 F (37.1 C) (Oral)  Ht 5\' 1"  (1.549 m)  Wt 138 lb 3.2 oz (62.687 kg)  BMI 26.13 kg/m2  LMP 12/13/2011  Wt Readings from Last 3 Encounters:  07/01/15 138 lb 3.2 oz (62.687 kg)  06/07/15 140 lb 9.6 oz (63.776 kg)  05/13/15 139 lb (63.05 kg)    Gen: NAD, alert, cooperative with exam, NCAT EYES: EOMI, no scleral injection or icterus ENT:  TMs pearly gray b/l, OP without erythema LYMPH: no cervical LAD CV: NRRR, normal S1/S2, no murmur, distal pulses 2+ b/l Resp: CTABL, no wheezes, normal WOB Abd: +BS, soft, NTND. no guarding or organomegaly Neuro: Alert and oriented, strength equal b/l UE and LE, coordination grossly normal MSK: normal muscle bulk     Assessment & Plan:   Favor was seen today for wants labs drawn for menopause.  Diagnoses and all orders for this visit:  Menopause -     Hepatitis C antibody -     HIV antibody (with reflex) -     Follicle Stimulating Hormone  Other specified anxiety disorders Will try to wean herself off of the valium, Will send in Rx now with refill. Pt  to come in for any further refills. Has been using primarily at night to help with sleep. Discussed sleep hygiene. -     diazepam (VALIUM) 2 MG tablet; Take 1 tablet (2 mg total) by mouth at bedtime as needed for anxiety.   Follow up plan: 3 months, sooner if needed  Assunta Found, MD [facility] 07/01/2015, 4:13 PM

## 2015-07-01 NOTE — Patient Instructions (Signed)
Cetirizine Flonase

## 2015-07-02 LAB — FOLLICLE STIMULATING HORMONE: FSH: 32.4 m[IU]/mL

## 2015-07-02 LAB — HIV ANTIBODY (ROUTINE TESTING W REFLEX): HIV Screen 4th Generation wRfx: NONREACTIVE

## 2015-07-02 LAB — HEPATITIS C ANTIBODY: Hep C Virus Ab: <0.1 s/co ratio (ref 0.0–0.9)

## 2015-07-06 ENCOUNTER — Telehealth: Payer: Self-pay | Admitting: Pediatrics

## 2015-07-06 NOTE — Telephone Encounter (Signed)
Pt had a hard time understanding message that was left. Labs reviewed with pt

## 2015-07-07 ENCOUNTER — Telehealth: Payer: Self-pay | Admitting: Pediatrics

## 2015-07-07 DIAGNOSIS — F418 Other specified anxiety disorders: Secondary | ICD-10-CM

## 2015-07-07 MED ORDER — SERTRALINE HCL 100 MG PO TABS: 150.0000 mg | ORAL_TABLET | Freq: Every day | ORAL | Status: DC

## 2015-07-07 NOTE — Addendum Note (Signed)
Addended by: Eustaquio Maize on: 07/07/2015 02:11 PM   Modules accepted: Orders

## 2015-07-07 NOTE — Telephone Encounter (Signed)
Patient called and stated she would like for her Zoloft to be increased and would like to start birth control, since she is getting ready to get married.  Message routed to Dr. Evette Doffing to review

## 2015-07-07 NOTE — Telephone Encounter (Signed)
Would like to increase zoloft. Is feeling more irritable, tired, mood has been down. This has been going on for past 5 days. Discussed this could be the increased stress at work, her upcoming wedding in 2 days and that with time it might get better. A zoloft increase would probably need 2-4 weeks for her to notice the different. She would still like to increase the zoloft, sent in script for 150mg  once daily. With [facility] elevation she is in menopause, has not had a period since 2013, likelihood of her getting pregnant is very low and she should not need birth control. Questions answered.

## 2015-08-10 ENCOUNTER — Telehealth: Payer: Self-pay | Admitting: Pediatrics

## 2015-08-20 ENCOUNTER — Other Ambulatory Visit: Payer: Self-pay

## 2015-08-20 ENCOUNTER — Telehealth: Payer: Self-pay | Admitting: Pediatrics

## 2015-08-20 DIAGNOSIS — N644 Mastodynia: Secondary | ICD-10-CM

## 2015-08-20 NOTE — Telephone Encounter (Signed)
Feeling discomfort in her L breast. Not feeling any lumps or bumps. H/o breast ca on L side. Pt called the breast center and said they would need a referral.

## 2015-08-26 ENCOUNTER — Other Ambulatory Visit: Payer: Self-pay | Admitting: Pediatrics

## 2015-08-26 DIAGNOSIS — N644 Mastodynia: Secondary | ICD-10-CM

## 2015-08-31 ENCOUNTER — Telehealth: Payer: Self-pay

## 2015-08-31 DIAGNOSIS — N644 Mastodynia: Secondary | ICD-10-CM

## 2015-08-31 NOTE — Telephone Encounter (Signed)
I put in the diagnostic. She didn't have a mass to ultrasound so I am not sure if that will be helpful now.

## 2015-08-31 NOTE — Telephone Encounter (Signed)
Need the order signed for the [place] and [place]

## 2015-09-01 ENCOUNTER — Ambulatory Visit
Admission: RE | Admit: 2015-09-01 | Discharge: 2015-09-01 | Disposition: A | Discharge status: Home / Self Care | Payer: BC Managed Care – PPO | Source: Ambulatory Visit | Attending: Pediatrics | Admitting: Pediatrics

## 2015-09-01 DIAGNOSIS — N644 Mastodynia: Secondary | ICD-10-CM

## 2015-09-06 ENCOUNTER — Other Ambulatory Visit: Payer: Self-pay | Admitting: Pediatrics

## 2015-09-07 NOTE — Telephone Encounter (Signed)
Last seen 07/01/15  Dr Evette Doffing    If approved route to [facility]

## 2015-09-08 NOTE — Telephone Encounter (Signed)
At her last appt we discussed that she would need to be seen for further refills of the valium, needs to be seen for refill.

## 2015-09-08 NOTE — Telephone Encounter (Signed)
Pt notified she will need to be seen Will call back to schedule appt

## 2015-09-08 NOTE — Telephone Encounter (Signed)
Please review and advise.

## 2015-09-15 ENCOUNTER — Ambulatory Visit (INDEPENDENT_AMBULATORY_CARE_PROVIDER_SITE_OTHER): Payer: BC Managed Care – PPO | Admitting: Pediatrics

## 2015-09-15 ENCOUNTER — Encounter: Payer: Self-pay | Admitting: Pediatrics

## 2015-09-15 VITALS — BP 97/60 | HR 76 | Temp 98.1°F | Ht 61.0 in | Wt 139.8 lb

## 2015-09-15 DIAGNOSIS — R42 Dizziness and giddiness: Secondary | ICD-10-CM | POA: Diagnosis not present

## 2015-09-15 DIAGNOSIS — R252 Cramp and spasm: Secondary | ICD-10-CM

## 2015-09-15 DIAGNOSIS — G44229 Chronic tension-type headache, not intractable: Secondary | ICD-10-CM

## 2015-09-15 DIAGNOSIS — R002 Palpitations: Secondary | ICD-10-CM | POA: Diagnosis not present

## 2015-09-15 DIAGNOSIS — F418 Other specified anxiety disorders: Secondary | ICD-10-CM | POA: Diagnosis not present

## 2015-09-15 DIAGNOSIS — Z780 Asymptomatic menopausal state: Secondary | ICD-10-CM | POA: Diagnosis not present

## 2015-09-15 MED ORDER — AMITRIPTYLINE HCL 10 MG PO TABS: 10.0000 mg | ORAL_TABLET | Freq: Every day | ORAL | Status: DC

## 2015-09-15 NOTE — Progress Notes (Signed)
Subjective:    Patient ID: Mandy Vargas, female    DOB: [birth year], 55 y.o.   MRN: [id number]  CC: Headache and leg aches   HPI: Mandy Vargas is a 55 y.o. female presenting for Headache and leg aches  Headaches every day, worse as the evening goes on Took some zyrtec Some days not as bad as others Most days does have them HA have been ongoing for almost a year Takes valium at night to help with sleep, hasnt had for the past 2 weeks, no change in symptoms, does sleep most of the time Stopped OCP several years ago, has not had a period since Still occasionally has hot flashes Has leg aches at night sometimes Doesn't always keep her up At times wonders if she has Restless legs    Depression screen [facility] 2/9 05/13/2015 10/02/2013  Decreased Interest 1 0  Down, Depressed, Hopeless 1 0  PHQ - 2 Score 2 0     Relevant past medical, surgical, family and social history reviewed and updated as indicated. Interim medical history since our last visit reviewed. Allergies and medications reviewed and updated.    ROS: Per HPI unless specifically indicated above  History  Smoking status  . Former Smoker -- 0.25 packs/day for 1 years  . Types: Cigarettes  . Quit date: 09/12/2011  Smokeless tobacco  . Not on file    Past Medical History Patient Active Problem List   Diagnosis Date Noted  . Allergic rhinitis 05/13/2015  . Arthritis of neck ([place]) 10/02/2013  . Annual physical exam 10/02/2013  . Screening examination for infectious disease 10/02/2013  . Screening cholesterol level 10/02/2013  . Contusion of head 08/25/2013  . Neck pain 08/25/2013  . 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 Skin tag 04/24/2013  . Adjustment reaction 04/24/2013  . Breast cancer, left breast ([place])   . 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 Palpitations 11/28/2010  . Kidney stones 11/28/2010  . Anxiety associated with depression 11/28/2010    Current  Outpatient Prescriptions  Medication Sig Dispense Refill  . buPROPion (WELLBUTRIN XL) 300 MG 24 hr tablet TAKE 1 TABLET (300 MG TOTAL) BY MOUTH DAILY. 30 tablet 5  . diazepam (VALIUM) 2 MG tablet Take 1 tablet (2 mg total) by mouth at bedtime as needed for anxiety. 30 tablet 1  . metoprolol tartrate (LOPRESSOR) 25 MG tablet Take 1 tablet (25 mg total) by mouth 2 (two) times daily. 60 tablet 5  . Multiple Vitamin (MULTIVITAMIN) tablet Take 1 tablet by mouth daily.    . sertraline (ZOLOFT) 100 MG tablet Take 1.5 tablets (150 mg total) by mouth daily. 45 tablet 5  . tamoxifen (NOLVADEX) 20 MG tablet TAKE 1 TABLET (20 MG TOTAL) BY MOUTH DAILY. 90 tablet 3   No current facility-administered medications for this visit.       Objective:    BP 97/60 mmHg  Pulse 76  Temp(Src) 98.1 F (36.7 C) (Oral)  Ht 5\' 1"  (1.549 m)  Wt 139 lb 12.8 oz (63.413 kg)  BMI 26.43 kg/m2  LMP 12/13/2011  Wt Readings from Last 3 Encounters:  09/15/15 139 lb 12.8 oz (63.413 kg)  07/01/15 138 lb 3.2 oz (62.687 kg)  06/07/15 140 lb 9.6 oz (63.776 kg)     Gen: NAD, alert, cooperative with exam, NCAT EYES: EOMI, no scleral injection or icterus ENT:  TMs pearly gray b/l, OP without erythema LYMPH: no cervical LAD CV:  NRRR, normal S1/S2, no murmur, distal pulses 2+ b/l Resp: CTABL, no wheezes, normal WOB Abd: +BS, soft, NTND. no guarding or organomegaly Ext: No edema, warm Neuro: Alert and oriented, strength equal b/l UE and LE, coordination grossly normal MSK: normal muscle bulk Psych: normal affect     Assessment & Plan:    [name] was seen today for multiple complaints.  Diagnoses and all orders for this visit:  Chronic tension-type headache, not intractable Has almost daily tension-type headache. Takes execedrin apprx twice a week, not taking anything else. No worse since stopping valium. Has been ongoing for the past year. No nausea or vomiting. Does not wake up with headaches int he middle of the  night. Sometimes headaches dont come until the night. SHe cant identify any triggers. BP low today. Will start amitriptyline for ppx, stop metoprolol as BP has been low, no h/o CAD or diagnosed arrhythmia. Continue to monitor symptoms. Consider neurology referral if symptoms continue. -     amitriptyline (ELAVIL) 10 MG tablet; Take 1 tablet (10 mg total) by mouth at bedtime.  Menopause FSH elevated, not having regular periods. Discussed very unlikely pt would be able to get pregnant.  Cramp of both lower extremities Treat above, ctm.  Palpitations If return after metoprolol dc will restart.  Episodic lightheadedness Stop metoprolol as above.  Depression Continue current meds, no changes today.  Follow up plan: Return in about 2 months (around 11/13/2015).  Assunta Found, MD [facility] 09/15/2015, 3:19 PM

## 2015-09-30 ENCOUNTER — Telehealth: Payer: Self-pay | Admitting: Pediatrics

## 2015-09-30 NOTE — Telephone Encounter (Signed)
Patient called stating she is still having headaches and watery eyes.  Excedrin is not helping.  Appt made to see Dr. Evette Doffing 01/25

## 2015-10-06 ENCOUNTER — Encounter: Payer: Self-pay | Admitting: Pediatrics

## 2015-10-06 ENCOUNTER — Encounter (INDEPENDENT_AMBULATORY_CARE_PROVIDER_SITE_OTHER): Payer: Self-pay

## 2015-10-06 ENCOUNTER — Ambulatory Visit (INDEPENDENT_AMBULATORY_CARE_PROVIDER_SITE_OTHER): Payer: BC Managed Care – PPO | Admitting: Pediatrics

## 2015-10-06 VITALS — BP 98/64 | HR 69 | Temp 97.9°F | Ht 61.0 in | Wt 138.2 lb

## 2015-10-06 DIAGNOSIS — G2581 Restless legs syndrome: Secondary | ICD-10-CM

## 2015-10-06 DIAGNOSIS — M791 Myalgia, unspecified site: Secondary | ICD-10-CM

## 2015-10-06 DIAGNOSIS — G479 Sleep disorder, unspecified: Secondary | ICD-10-CM | POA: Diagnosis not present

## 2015-10-06 DIAGNOSIS — M79606 Pain in leg, unspecified: Secondary | ICD-10-CM | POA: Diagnosis not present

## 2015-10-06 DIAGNOSIS — G44209 Tension-type headache, unspecified, not intractable: Secondary | ICD-10-CM | POA: Diagnosis not present

## 2015-10-06 NOTE — Progress Notes (Signed)
Subjective:    Patient ID: Mandy Vargas, female    DOB: 1961-01-10, 55 y.o.   MRN: [id number]  CC: medication check and follow up  HPI: Mandy Vargas is a 55 y.o. female presenting for medication check  Headaches have been bothering her Elavil made her feel depressed and thinks made her heart racing Still having problem with her legs, feels some pain at times at night Anxiety symptoms doing ok Still with stress at work with preschoolers No fevers, no vision changes Headaches are unchanged, also unimproved  Depression screen [facility] 2/9 10/06/2015 05/13/2015 10/02/2013  Decreased Interest 0 1 0  Down, Depressed, Hopeless 0 1 0  PHQ - 2 Score 0 2 0     Relevant past medical, surgical, family and social history reviewed and updated as indicated. Interim medical history since our last visit reviewed. Allergies and medications reviewed and updated.    ROS: Per HPI unless specifically indicated above  History  Smoking status  . Former Smoker -- 0.25 packs/day for 1 years  . Types: Cigarettes  . Quit date: 09/12/2011  Smokeless tobacco  . Not on file    Past Medical History Patient Active Problem List   Diagnosis Date Noted  . Tension headache 10/08/2015  . Allergic rhinitis 05/13/2015  . Arthritis of neck ([place]) 10/02/2013  . Annual physical exam 10/02/2013  . Screening examination for infectious disease 10/02/2013  . Screening cholesterol level 10/02/2013  . Contusion of head 08/25/2013  . Neck pain 08/25/2013  . 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 Skin tag 04/24/2013  . Adjustment reaction 04/24/2013  . Breast cancer, left breast ([place])   . 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 Palpitations 11/28/2010  . Kidney stones 11/28/2010  . Anxiety associated with depression 11/28/2010    Current Outpatient Prescriptions  Medication Sig Dispense Refill  . buPROPion (WELLBUTRIN XL) 300 MG 24 hr tablet TAKE 1 TABLET (300 MG TOTAL) BY  MOUTH DAILY. 30 tablet 5  . Multiple Vitamin (MULTIVITAMIN) tablet Take 1 tablet by mouth daily.    . sertraline (ZOLOFT) 100 MG tablet Take 1.5 tablets (150 mg total) by mouth daily. 45 tablet 5  . tamoxifen (NOLVADEX) 20 MG tablet TAKE 1 TABLET (20 MG TOTAL) BY MOUTH DAILY. 90 tablet 3   No current facility-administered medications for this visit.       Objective:    BP 98/64 mmHg  Pulse 69  Temp(Src) 97.9 F (36.6 C) (Oral)  Ht '5\' 1"'$  (1.549 m)  Wt 138 lb 3.2 oz (62.687 kg)  BMI 26.13 kg/m2  LMP 12/13/2011  Wt Readings from Last 3 Encounters:  10/06/15 138 lb 3.2 oz (62.687 kg)  09/15/15 139 lb 12.8 oz (63.413 kg)  07/01/15 138 lb 3.2 oz (62.687 kg)     Gen: NAD, alert, cooperative with exam, NCAT EYES: EOMI, no scleral injection or icterus ENT:  TMs pearly gray b/l, OP without erythema LYMPH: no cervical LAD CV: NRRR, normal S1/S2, no murmur, distal pulses 2+ b/l Resp: CTABL, no wheezes, normal WOB Abd: +BS, soft, NTND. no guarding or organomegaly Ext: No edema, warm Neuro: Alert and oriented, strength equal b/l UE and LE, coordination grossly normal, normal CN III-CXII MSK: normal muscle bulk     Assessment & Plan:    Mandy Vargas was seen today for medication check and follow up. Will do trial of gabapentin for likely RLS and tension HA ppx after labs as below are  back. Start with '100mg'$  in am, '200mg'$  in pm given pt's prior sensitivity to medicines. Likely will need increased dose. While metoprolol higher dose may help with tension HA ppx, pt has been occasionally symptomatic with current '25mg'$  with lightheadedness. Also consider topiramate for HA.  Diagnoses and all orders for this visit:  Muscle ache -     BMP8+EGFR -     Calcium -     Magnesium -     B12 and Folate Panel  Sleep disturbance -     BMP8+EGFR -     Calcium -     Magnesium -     B12 and Folate Panel  Pain of lower extremity, unspecified laterality -     BMP8+EGFR -     Calcium -     Magnesium -      B12 and Folate Panel  Tension headache As above.  Restless leg syndrome As above    Follow up plan: Return in about 4 weeks (around 11/03/2015).  Assunta Found, MD [facility] 10/06/2015, 3:48 PM

## 2015-10-07 LAB — BMP8+EGFR
BUN/Creatinine Ratio: 20 (ref 9–23)
BUN: 18 mg/dL (ref 6–24)
CALCIUM: 9.3 mg/dL (ref 8.7–10.2)
CO2: 25 mmol/L (ref 18–29)
CREATININE: 0.9 mg/dL (ref 0.57–1.00)
Chloride: 100 mmol/L (ref 96–106)
GFR calc Af Amer: 82 mL/min/{1.73_m2} (ref >59)
GFR calc non Af Amer: 71 mL/min/{1.73_m2} (ref >59)
GLUCOSE: 85 mg/dL (ref 65–99)
Potassium: 4.8 mmol/L (ref 3.5–5.2)
Sodium: 139 mmol/L (ref 134–144)

## 2015-10-07 LAB — B12 AND FOLATE PANEL: VITAMIN B 12: 352 pg/mL (ref 211–946)

## 2015-10-07 LAB — MAGNESIUM: MAGNESIUM: 2.4 mg/dL — AB (ref 1.6–2.3)

## 2015-10-08 ENCOUNTER — Telehealth: Payer: Self-pay | Admitting: Pediatrics

## 2015-10-08 DIAGNOSIS — G44209 Tension-type headache, unspecified, not intractable: Secondary | ICD-10-CM | POA: Insufficient documentation

## 2015-10-08 MED ORDER — GABAPENTIN 100 MG PO CAPS: ORAL_CAPSULE | ORAL | Status: DC

## 2015-10-08 NOTE — Telephone Encounter (Signed)
Pt states at her visit the other day you had mentioned putting her on propanolol since she is stopping the elavil. Please advise

## 2015-10-09 ENCOUNTER — Telehealth: Payer: Self-pay | Admitting: Pediatrics

## 2015-10-09 NOTE — Telephone Encounter (Signed)
Patient aware.

## 2015-10-09 NOTE — Telephone Encounter (Signed)
When given results pt had said still having headaches. I do want to put her on a different kind of medicine to help with hopefully both headaches and restless legs. I sent in gabapentin, 100mg  tabs, take one in the morning, 2 tabs or 200mg  at night. This is a very low dose and we might need to change in future. Sometime can make people feel sleepy but usually not too many side effects from the medicine. Gabapentin is an anti-seizure kind of medicine, but used at very low doses like this can help with headache prevention and restless legs. Let me know if any other questions.

## 2015-10-13 ENCOUNTER — Telehealth: Payer: Self-pay | Admitting: Pediatrics

## 2015-10-13 DIAGNOSIS — G2581 Restless legs syndrome: Secondary | ICD-10-CM

## 2015-10-13 NOTE — Telephone Encounter (Signed)
Referral to neurology sleep center placed

## 2015-10-13 NOTE — Telephone Encounter (Signed)
Pt aware referral has been placed and she will be contacted with the appt

## 2015-10-21 ENCOUNTER — Telehealth: Payer: Self-pay | Admitting: Pediatrics

## 2015-10-21 NOTE — Telephone Encounter (Signed)
Pt has not yet tried gabapentin for possible RLS and tension HA ppx prescribed at last visit, has been nervous about side effects. Is going to start 100mg  qhs. Can increase to 200mg  qhs. Will let me know how symptoms are going. Likely will need to increase further. Discussed trying to avoid valium as sleep aid.

## 2015-11-08 ENCOUNTER — Ambulatory Visit: Payer: BC Managed Care – PPO | Admitting: Neurology

## 2015-11-08 ENCOUNTER — Other Ambulatory Visit: Payer: BC Managed Care – PPO

## 2015-11-08 ENCOUNTER — Ambulatory Visit (INDEPENDENT_AMBULATORY_CARE_PROVIDER_SITE_OTHER): Payer: BC Managed Care – PPO | Admitting: Neurology

## 2015-11-08 ENCOUNTER — Encounter: Payer: Self-pay | Admitting: Neurology

## 2015-11-08 VITALS — BP 128/70 | HR 72 | Ht 61.0 in | Wt 138.1 lb

## 2015-11-08 DIAGNOSIS — M542 Cervicalgia: Secondary | ICD-10-CM

## 2015-11-08 DIAGNOSIS — G2581 Restless legs syndrome: Secondary | ICD-10-CM

## 2015-11-08 DIAGNOSIS — G44221 Chronic tension-type headache, intractable: Secondary | ICD-10-CM

## 2015-11-08 MED ORDER — CYCLOBENZAPRINE HCL 5 MG PO TABS: 5.0000 mg | ORAL_TABLET | Freq: Every evening | ORAL | Status: DC | PRN

## 2015-11-08 MED ORDER — ROPINIROLE HCL 0.25 MG PO TABS
ORAL_TABLET | ORAL | Status: DC
Start: 2015-11-08 — End: 2015-11-30

## 2015-11-08 NOTE — Progress Notes (Signed)
[facility] Clinic Note - Initial Visit   Date: 11/08/2015  Mandy Vargas MRN: [id number] DOB: 1960-12-16   Dear Dr. Evette Doffing:  Thank you for your kind referral of Mandy Vargas for consultation of RLS. Although her history is well known to you, please allow [place] to reiterate it for the purpose of our medical record. The patient was accompanied to the clinic by self.    History of Present Illness: Mandy Vargas is a 55 y.o. right-handed Caucasian female with history of left breast lumpectomy and radiation therapy (2013), anxiety/depression, and palpitations presenting for evaluation of restless leg syndrome.    Starting around December 2016, she began having the urge to move her right leg, described as restless.  Symptoms start when at rest such as when she lays down and alleviated by walking. She does not have numbness, tingling, or weakness.  She has very rare spells of pain in the left foot, but nothing quite as severe at the right leg.  She has tried gabapentin (irritable), elavil (irritable, insomnia), and OTC ointments such as [place] which did not provide any relief.  She also has a history of headaches, described as a dull ache which is worse bitemporally and at the base of the neck, which occurs most days of the week and generally lasts all day.  She takes tylenol about three times per week, which can provide some relief.  No associated photophobia, phonophobia, nausea or vomiting. She is able to function with her headaches.  Pain is ranked as 5/10.   Overall, she feels that her headaches are slowly improving.    Out-side paper records, electronic medical record, and images have been reviewed where available and summarized as:  Lab Results  Component Value Date   TSH 1.730 10/09/2013   Lab Results  Component Value Date   VITAMINB12 352 10/06/2015    Past Medical History  Diagnosis Date  . Wears glasses   . Bruises easily   . Depression   .  Palpitations   . Anemia   . Breast cancer ([facility])     Left - DCIS  . Nephrolithiasis     Status post lithotripsy    Past Surgical History  Procedure Laterality Date  . Dilation and curettage of uterus  1993  . Kidney stone surgery    . Breast biopsy  12/22/11    DCIS w/calcifications  . Breast lumpectomy  02/19/12    Left breast, DCIS, ER/PR+     Medications:  Outpatient Encounter Prescriptions as of 11/08/2015  Medication Sig Note  . buPROPion (WELLBUTRIN XL) 300 MG 24 hr tablet TAKE 1 TABLET (300 MG TOTAL) BY MOUTH DAILY.   . Magnesium 500 MG CAPS Take by mouth.   . metoprolol tartrate (LOPRESSOR) 25 MG tablet  11/08/2015: Received from: External Pharmacy  . Multiple Vitamin (MULTIVITAMIN) tablet Take 1 tablet by mouth daily.   . Red Yeast Rice Extract (RED YEAST RICE PO) Take by mouth.   . sertraline (ZOLOFT) 100 MG tablet Take 1.5 tablets (150 mg total) by mouth daily.   . tamoxifen (NOLVADEX) 20 MG tablet TAKE 1 TABLET (20 MG TOTAL) BY MOUTH DAILY.   . [DISCONTINUED] amitriptyline (ELAVIL) 10 MG tablet TAKE 1 TABLET (10 MG TOTAL) BY MOUTH AT BEDTIME. 11/08/2015: Received from: External Pharmacy  . [DISCONTINUED] gabapentin (NEURONTIN) 100 MG capsule 1 tab in morning, 2 tabs 2 hours before bed    No facility-administered encounter medications on file as of 11/08/2015.  Allergies: No Known Allergies  Family History: Family History  Problem Relation Age of Onset  . Colon cancer Mother   . Breast cancer Sister   . Cancer Sister 58    Breast  . Colon cancer Paternal Grandmother     20'S  . Anesthesia problems Neg Hx   . Cancer Maternal Aunt     Breast    Social History: Social History  Substance Use Topics  . Smoking status: Former Smoker -- 0.25 packs/day for 1 years    Types: Cigarettes    Quit date: 09/12/2011  . Smokeless tobacco: Never Used  . 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   EYES: No visual changes or eye pain ENT: No hearing changes.  No history of nose bleeds.   RESPIRATORY: No cough, wheezing and shortness of breath.   CARDIOVASCULAR: Negative for chest pain, and palpitations.   GI: Negative for abdominal discomfort, blood in stools or black stools.  No recent change in bowel habits.   GU:  No history of incontinence.   MUSCLOSKELETAL: No history of joint pain or swelling.  No myalgias.   SKIN: Negative for lesions, rash, and itching.   HEMATOLOGY/ONCOLOGY: Negative for prolonged bleeding, bruising easily, and swollen nodes.  No history of cancer.   ENDOCRINE: Negative for cold or heat intolerance, polydipsia or goiter.   PSYCH:  +depression or anxiety symptoms.   NEURO: As Above.   Vital Signs:  BP 128/70 mmHg  Pulse 72  Ht 5\' 1"  (1.549 m)  Wt 138 lb 2 oz (62.653 kg)  BMI 26.11 kg/m2  SpO2 98%  LMP 12/13/2011 Pain Scale: 0 on a scale of 0-10   General Medical Exam:   General:  Well appearing, comfortable.   Eyes/ENT: see cranial nerve examination.   Neck: No masses appreciated.  Full range of motion without tenderness.  No carotid bruits. Respiratory:  Clear to auscultation, good air entry bilaterally.   Cardiac:  Regular rate and rhythm, no murmur.   Extremities:  No deformities, edema, or skin discoloration.  Skin:  No rashes or lesions.  Neurological Exam: MENTAL STATUS including orientation to time, place, person, recent and remote memory, attention span and concentration, language, and fund of knowledge is normal.  Speech is not dysarthric.  CRANIAL NERVES: II:  No visual field defects.  Unremarkable fundi.   III-IV-VI: Pupils equal round and reactive to light.  Normal conjugate, extra-ocular eye movements in all directions of gaze.  No nystagmus.  No ptosis.   V:  Normal facial sensation.     VII:   Normal facial symmetry and movements.  VIII:  Normal hearing and vestibular function.   IX-X:  Normal palatal movement.   XI:  Normal shoulder shrug and head rotation.   XII:  Normal tongue strength and range of motion, no deviation or fasciculation.  MOTOR:  No atrophy, fasciculations or abnormal movements.  No pronator drift.  Tone is normal.    Right Upper Extremity:    Left Upper Extremity:    Deltoid  5/5   Deltoid  5/5   Biceps  5/5   Biceps  5/5   Triceps  5/5   Triceps  5/5   Wrist extensors  5/5   Wrist extensors  5/5   Wrist  flexors  5/5   Wrist flexors  5/5   Finger extensors  5/5   Finger extensors  5/5   Finger flexors  5/5   Finger flexors  5/5   Dorsal interossei  5/5   Dorsal interossei  5/5   Abductor pollicis  5/5   Abductor pollicis  5/5   Tone (Ashworth scale)  0  Tone (Ashworth scale)  0   Right Lower Extremity:    Left Lower Extremity:    Hip flexors  5/5   Hip flexors  5/5   Hip extensors  5/5   Hip extensors  5/5   Knee flexors  5/5   Knee flexors  5/5   Knee extensors  5/5   Knee extensors  5/5   Dorsiflexors  5/5   Dorsiflexors  5/5   Plantarflexors  5/5   Plantarflexors  5/5   Toe extensors  5/5   Toe extensors  5/5   Toe flexors  5/5   Toe flexors  5/5   Tone (Ashworth scale)  0  Tone (Ashworth scale)  0   MSRs:  Right                                                                 Left brachioradialis 2+  brachioradialis 2+  biceps 2+  biceps 2+  triceps 2+  triceps 2+  patellar 2+  patellar 2+  ankle jerk 2+  ankle jerk 2+  Hoffman no  Hoffman no  plantar response down  plantar response down   SENSORY:  Normal and symmetric perception of light touch, pinprick, vibration, and proprioception.  Romberg's sign absent.   COORDINATION/GAIT: Normal finger-to- nose-finger and heel-to-shin.  Intact rapid alternating movements bilaterally.  Able to rise from a chair without using arms.  Gait narrow based and stable. Tandem and stressed gait intact.     IMPRESSION: 1.  Restless leg syndrome 2.  Chronic tension headaches  Previously tried gabapentin and amitriptyline without benefit and due to side effects, unable to titrate to an effective dose.  Discussed options of (1) trying Lyrica which can help with RLS and headaches or (2) trial of dopamine agonist for RLS and/or neck PT.  She is most bothered by RLS symptoms so prefers trying dopamine agonist.    PLAN/RECOMMENDATIONS:  1.  Start requip 0.25mg  3 hours before bedtime for one week, if no benefit, then increase to 2 tablets three hours before bedtime.  Side effects discussed.  2.  Take flexeril 5mg  at bedtime as needed for headaches. 3.  Check ferritin 4.  Limit tylenol or other rescue medications to twice per week  Return to clinic in 3 months.   The duration of this appointment visit was 40 minutes of face-to-face time with the patient.  Greater than 50% of this time was spent in counseling, explanation of diagnosis, planning of further management, and coordination of care.   Thank you for allowing me to participate in patient's care.  If I can answer any additional questions, I would be pleased to do so.    Sincerely,    Donika K. Posey Pronto, DO

## 2015-11-08 NOTE — Patient Instructions (Addendum)
1.  Start requip 0.25mg  3 hours before bedtime for one week, if no benefit, then increase to 2 tablets three hours before bedtime 2.  Take flexeril 5mg  at bedtime as needed for headaches. 3.  Check lab work 4.  Try to limit tylenol to twice per week  Return to clinic in 3 months

## 2015-11-09 LAB — FERRITIN: Ferritin: 25 ng/mL (ref 10–232)

## 2015-11-15 ENCOUNTER — Other Ambulatory Visit: Payer: Self-pay | Admitting: Pediatrics

## 2015-11-15 DIAGNOSIS — B379 Candidiasis, unspecified: Secondary | ICD-10-CM

## 2015-11-15 MED ORDER — FLUCONAZOLE 150 MG PO TABS: 150.0000 mg | ORAL_TABLET | Freq: Once | ORAL | Status: DC

## 2015-11-15 NOTE — Telephone Encounter (Signed)
Patient aware.

## 2015-11-15 NOTE — Telephone Encounter (Signed)
Sent diflucan in to pharmacy

## 2015-11-17 ENCOUNTER — Encounter: Payer: Self-pay | Admitting: Hematology and Oncology

## 2015-11-17 NOTE — Progress Notes (Signed)
Returned pt's voicemail who wanted itemized bills for all of 2016 for taxes. Gave patient the number to billing [phone].

## 2015-11-30 ENCOUNTER — Ambulatory Visit (INDEPENDENT_AMBULATORY_CARE_PROVIDER_SITE_OTHER): Payer: BC Managed Care – PPO | Admitting: Nurse Practitioner

## 2015-11-30 ENCOUNTER — Encounter: Payer: Self-pay | Admitting: Nurse Practitioner

## 2015-11-30 VITALS — BP 100/52 | HR 63 | Temp 97.8°F | Ht 61.0 in | Wt 140.0 lb

## 2015-11-30 DIAGNOSIS — J0140 Acute pansinusitis, unspecified: Secondary | ICD-10-CM

## 2015-11-30 DIAGNOSIS — K0889 Other specified disorders of teeth and supporting structures: Secondary | ICD-10-CM | POA: Diagnosis not present

## 2015-11-30 MED ORDER — CLINDAMYCIN HCL 300 MG PO CAPS: 300.0000 mg | ORAL_CAPSULE | Freq: Three times a day (TID) | ORAL | Status: DC

## 2015-11-30 NOTE — Progress Notes (Signed)
  Subjective:     Mandy Vargas is a 55 y.o. female who presents for evaluation of sinus pain. Symptoms include: congestion, cough, facial pain, headaches, post nasal drip, sinus pressure and tooth pain. Onset of symptoms was 3 days ago. Symptoms have been gradually worsening since that time. Past history is significant for no history of pneumonia or bronchitis. Patient is a non-smoker.  The following portions of the patient's history were reviewed and updated as appropriate: allergies, current medications, past family history, past medical history, past social history, past surgical history and problem list.  Review of Systems Pertinent items are noted in HPI.   Objective:    BP 100/52 mmHg  Pulse 63  Temp(Src) 97.8 F (36.6 C) (Oral)  Ht 5\' 1"  (1.549 m)  Wt 140 lb (63.504 kg)  BMI 26.47 kg/m2  LMP 12/13/2011 General appearance: alert and cooperative Eyes: conjunctivae/corneas clear. PERRL, EOM's intact. Fundi benign. Ears: normal TM's and external ear canals both ears Nose: Nares normal. Septum midline. Mucosa normal. No drainage or sinus tenderness., clear discharge, moderate congestion, turbinates pale, no sinus tenderness Throat: lips, mucosa, and tongue normal; teeth and gums normal gum around right upper second molar mild erythema - no edema Neck: no adenopathy, no carotid bruit, no JVD, supple, symmetrical, trachea midline and thyroid not enlarged, symmetric, no tenderness/mass/nodules Lungs: clear to auscultation bilaterally Heart: regular rate and rhythm, S1, S2 normal, no murmur, click, rub or gallop    Assessment:    Acute  Sinusitis and tooth ache.    Plan:   1. Take meds as prescribed 2. Use a cool mist humidifier especially during the winter months and when heat has been humid. 3. Use saline nose sprays frequently 4. Saline irrigations of the nose can be very helpful if done frequently.  * 4X daily for 1 week*  * Use of a nettie pot can be helpful with this.  Follow directions with this* 5. Drink plenty of fluids 6. Keep thermostat turn down low 7.For any cough or congestion  Use plain Mucinex- regular strength or max strength is fine   * Children- consult with Pharmacist for dosing 8. For fever or aces or pains- take tylenol or ibuprofen appropriate for age and weight.  * for fevers greater than 101 orally you may alternate ibuprofen and tylenol every  3 hours.   Meds ordered this encounter  Medications  . escitalopram (LEXAPRO) 10 MG tablet    Sig: Take 10 mg by mouth daily.    Refill:  12  . clindamycin (CLEOCIN) 300 MG capsule    Sig: Take 1 capsule (300 mg total) by mouth 3 (three) times daily.    Dispense:  40 capsule    Refill:  0    Order Specific Question:  Supervising Provider    Answer:  Chipper Herb Arcadia, FNP

## 2015-12-01 ENCOUNTER — Telehealth: Payer: Self-pay

## 2015-12-01 ENCOUNTER — Telehealth: Payer: Self-pay | Admitting: Pediatrics

## 2015-12-01 NOTE — Telephone Encounter (Signed)
I spoke with the pt and let her know that wellbutrin and tamoxifen interact; the wellbutrin "reduces the amount of tamoxifen".  I advised pt that Dr. Lindi Adie would like her to talk with the MD that prescribes her the wellbutrin about the possiblity of switching her to effexor.  Pt voiced understanding and agreed to call back to the clinic with the outcome.

## 2015-12-01 NOTE — Telephone Encounter (Signed)
Pt is taking antibiotic and Tylenol  She is having headache and teeth pain Wants RX for pain Please advise

## 2015-12-01 NOTE — Telephone Encounter (Signed)
Neti pot for sinus pain also will help with sinus pressure

## 2015-12-01 NOTE — Telephone Encounter (Signed)
LMOVM for pt to return call to clinic - ask to speak to desk RN.

## 2015-12-01 NOTE — Telephone Encounter (Signed)
She should take ibuprofen 600mg  three times a day. Does she think she has a tooth infection or just pain from the sinusitis? Needs to get in to see dentist if tooth pain from tooth not sinuses, if she does have tooth infection the clindamycin will help treat and should start to feel better soon.

## 2015-12-01 NOTE — Telephone Encounter (Signed)
Patient is just having tooth pain, no sinus pain.   Patient aware to take ibuprofen 600mg  three times daily and to try to get in with dentist

## 2015-12-02 ENCOUNTER — Telehealth: Payer: Self-pay | Admitting: Pediatrics

## 2015-12-03 NOTE — Telephone Encounter (Signed)
Left message stating that Clindamycin is the best thing for her to take.  Per MMM.

## 2015-12-03 NOTE — Telephone Encounter (Signed)
Can she break them in half and take or are they capsules

## 2015-12-06 NOTE — Telephone Encounter (Signed)
Patient states that she has even tried sprinkling the clindamycin capsules in juice and take it this way but was unable. Patient would like something else called into pharmacy.

## 2015-12-16 ENCOUNTER — Encounter: Payer: Self-pay | Admitting: Nurse Practitioner

## 2015-12-16 ENCOUNTER — Ambulatory Visit (INDEPENDENT_AMBULATORY_CARE_PROVIDER_SITE_OTHER): Payer: BC Managed Care – PPO | Admitting: Nurse Practitioner

## 2015-12-16 VITALS — BP 88/49 | HR 44 | Temp 98.4°F | Ht 61.0 in | Wt 138.0 lb

## 2015-12-16 DIAGNOSIS — R109 Unspecified abdominal pain: Secondary | ICD-10-CM

## 2015-12-16 NOTE — Patient Instructions (Signed)

## 2015-12-16 NOTE — Progress Notes (Signed)
   Subjective:    Patient ID: Mandy Vargas, female    DOB: 1960/09/28, 55 y.o.   MRN: [id number]  HPI Patient comes in today c/oStomach pain intermittently since Tuesday- she thinks she is getting better then it comes back. No nausea or vomiting- slight diarrhea. Has fever of 100.3 earlier today- she ate some salmon  Right before she started feeling bad.Last BM was yesterday- very gassy.    Review of Systems  Constitutional: Positive for appetite change (decreased).  HENT: Negative.   Respiratory: Negative.   Gastrointestinal: Positive for abdominal pain and diarrhea. Negative for nausea, vomiting and constipation.  Genitourinary: Negative.   Neurological: Negative.   Psychiatric/Behavioral: Negative.   All other systems reviewed and are negative.      Objective:   Physical Exam  Constitutional: She is oriented to person, place, and time. She appears well-developed and well-nourished. No distress.  Cardiovascular: Normal rate, regular rhythm and normal heart sounds.   Pulmonary/Chest: Effort normal and breath sounds normal.  Abdominal: Soft. Bowel sounds are normal.  Neurological: She is alert and oriented to person, place, and time.  Skin: Skin is warm.  Psychiatric: She has a normal mood and affect. Her behavior is normal. Judgment and thought content normal.    BP 88/49 mmHg  Pulse 44  Temp(Src) 98.4 F (36.9 C) (Oral)  Ht 5\' 1"  (1.549 m)  Wt 138 lb (62.596 kg)  BMI 26.09 kg/m2  LMP 12/13/2011       Assessment & Plan:   1. Abdominal pain, unspecified abdominal location    Orders Placed This Encounter  Procedures  . CBC with Differential/Platelet  force fluids Going to check CBC Continue bland diet May need to come back in for xray after get lab results back  [place], FNP

## 2015-12-17 ENCOUNTER — Emergency Department (HOSPITAL_BASED_OUTPATIENT_CLINIC_OR_DEPARTMENT_OTHER)
Admission: EM | Admit: 2015-12-17 | Discharge: 2015-12-17 | Disposition: A | Discharge status: Home / Self Care | Payer: BC Managed Care – PPO | Attending: Emergency Medicine | Admitting: Emergency Medicine

## 2015-12-17 ENCOUNTER — Emergency Department (HOSPITAL_BASED_OUTPATIENT_CLINIC_OR_DEPARTMENT_OTHER): Payer: BC Managed Care – PPO

## 2015-12-17 ENCOUNTER — Encounter (HOSPITAL_BASED_OUTPATIENT_CLINIC_OR_DEPARTMENT_OTHER): Payer: Self-pay | Admitting: *Deleted

## 2015-12-17 DIAGNOSIS — R1032 Left lower quadrant pain: Secondary | ICD-10-CM | POA: Diagnosis present

## 2015-12-17 DIAGNOSIS — K5100 Ulcerative (chronic) pancolitis without complications: Secondary | ICD-10-CM | POA: Insufficient documentation

## 2015-12-17 DIAGNOSIS — Z87891 Personal history of nicotine dependence: Secondary | ICD-10-CM | POA: Insufficient documentation

## 2015-12-17 DIAGNOSIS — Z853 Personal history of malignant neoplasm of breast: Secondary | ICD-10-CM | POA: Insufficient documentation

## 2015-12-17 DIAGNOSIS — Z862 Personal history of diseases of the blood and blood-forming organs and certain disorders involving the immune mechanism: Secondary | ICD-10-CM | POA: Diagnosis not present

## 2015-12-17 DIAGNOSIS — Z973 Presence of spectacles and contact lenses: Secondary | ICD-10-CM | POA: Diagnosis not present

## 2015-12-17 DIAGNOSIS — Z79899 Other long term (current) drug therapy: Secondary | ICD-10-CM | POA: Diagnosis not present

## 2015-12-17 DIAGNOSIS — Z87442 Personal history of urinary calculi: Secondary | ICD-10-CM | POA: Diagnosis not present

## 2015-12-17 DIAGNOSIS — Z792 Long term (current) use of antibiotics: Secondary | ICD-10-CM | POA: Insufficient documentation

## 2015-12-17 DIAGNOSIS — F329 Major depressive disorder, single episode, unspecified: Secondary | ICD-10-CM | POA: Insufficient documentation

## 2015-12-17 LAB — CBC WITH DIFFERENTIAL/PLATELET
BASOS ABS: 0 10*3/uL (ref 0.0–0.2)
BASOS PCT: 0 %
Basophils Absolute: 0 10*3/uL (ref 0.0–0.1)
Basos: 0 %
EOS (ABSOLUTE): 0 10*3/uL (ref 0.0–0.4)
Eos: 0 %
Eosinophils Absolute: 0.1 10*3/uL (ref 0.0–0.7)
Eosinophils Relative: 1 %
HEMATOCRIT: 34.7 % — AB (ref 36.0–46.0)
HEMOGLOBIN: 11.2 g/dL — AB (ref 12.0–15.0)
Hematocrit: 36.5 % (ref 34.0–46.6)
Hemoglobin: 11.9 g/dL (ref 11.1–15.9)
Immature Grans (Abs): 0 10*3/uL (ref 0.0–0.1)
Immature Granulocytes: 0 %
LYMPHS ABS: 1.5 10*3/uL (ref 0.7–3.1)
LYMPHS ABS: 1 10*3/uL (ref 0.7–4.0)
Lymphocytes Relative: 12 %
Lymphs: 15 %
MCH: 29.1 pg (ref 26.6–33.0)
MCH: 29.9 pg (ref 26.0–34.0)
MCHC: 32.6 g/dL (ref 31.5–35.7)
MCHC: 32.3 g/dL (ref 30.0–36.0)
MCV: 89 fL (ref 79–97)
MCV: 92.5 fL (ref 78.0–100.0)
MONOCYTES: 6 %
MONOS PCT: 5 %
Monocytes Absolute: 0.6 10*3/uL (ref 0.1–0.9)
Monocytes Absolute: 0.4 10*3/uL (ref 0.1–1.0)
NEUTROS ABS: 7.8 10*3/uL — AB (ref 1.4–7.0)
NEUTROS ABS: 6.8 10*3/uL (ref 1.7–7.7)
NEUTROS PCT: 82 %
Neutrophils: 79 %
PLATELETS: 184 10*3/uL (ref 150–379)
Platelets: 150 10*3/uL (ref 150–400)
RBC: 4.09 x10E6/uL (ref 3.77–5.28)
RBC: 3.75 MIL/uL — ABNORMAL LOW (ref 3.87–5.11)
RDW: 12.9 % (ref 12.3–15.4)
RDW: 12.9 % (ref 11.5–15.5)
WBC: 9.9 10*3/uL (ref 3.4–10.8)
WBC: 8.3 10*3/uL (ref 4.0–10.5)

## 2015-12-17 LAB — COMPREHENSIVE METABOLIC PANEL
ALBUMIN: 3.1 g/dL — AB (ref 3.5–5.0)
ALK PHOS: 47 U/L (ref 38–126)
ALT: 16 U/L (ref 14–54)
ANION GAP: 7 (ref 5–15)
AST: 21 U/L (ref 15–41)
BILIRUBIN TOTAL: 0.5 mg/dL (ref 0.3–1.2)
BUN: 20 mg/dL (ref 6–20)
CALCIUM: 8.4 mg/dL — AB (ref 8.9–10.3)
CO2: 26 mmol/L (ref 22–32)
CREATININE: 0.89 mg/dL (ref 0.44–1.00)
Chloride: 103 mmol/L (ref 101–111)
GFR calc Af Amer: >60 mL/min (ref >60)
GFR calc non Af Amer: >60 mL/min (ref >60)
GLUCOSE: 144 mg/dL — AB (ref 65–99)
Potassium: 3.4 mmol/L — ABNORMAL LOW (ref 3.5–5.1)
Sodium: 136 mmol/L (ref 135–145)
TOTAL PROTEIN: 6.1 g/dL — AB (ref 6.5–8.1)

## 2015-12-17 LAB — OCCULT BLOOD X 1 CARD TO LAB, STOOL: Fecal Occult Bld: NEGATIVE

## 2015-12-17 LAB — LIPASE, BLOOD: Lipase: 15 U/L (ref 11–51)

## 2015-12-17 MED ORDER — CIPROFLOXACIN HCL 500 MG PO TABS: 500.0000 mg | ORAL_TABLET | Freq: Two times a day (BID) | ORAL | Status: DC

## 2015-12-17 MED ORDER — SODIUM CHLORIDE 0.9 % IV BOLUS (SEPSIS)
1000.0000 mL | Freq: Once | INTRAVENOUS | Status: AC
  Administered 2015-12-17: 1000 mL via INTRAVENOUS

## 2015-12-17 MED ORDER — METRONIDAZOLE 500 MG PO TABS: 500.0000 mg | ORAL_TABLET | Freq: Three times a day (TID) | ORAL | Status: DC

## 2015-12-17 MED ORDER — ACETAMINOPHEN 325 MG PO TABS
650.0000 mg | ORAL_TABLET | Freq: Once | ORAL | Status: AC
  Administered 2015-12-17: 650 mg via ORAL
  Filled 2015-12-17: qty 2

## 2015-12-17 MED ORDER — IOPAMIDOL (ISOVUE-300) INJECTION 61%
100.0000 mL | Freq: Once | INTRAVENOUS | Status: AC | PRN
  Administered 2015-12-17: 100 mL via INTRAVENOUS

## 2015-12-17 MED FILL — CIPROFLOXACIN HCL 500 MG TA: 500 | 5 days supply | Qty: 10 | Fill #0

## 2015-12-17 MED FILL — metroNIDAZOLE 500 MG TABS: 500 | 5 days supply | Qty: 15 | Fill #0

## 2015-12-17 NOTE — ED Notes (Signed)
Abdominal pain x 3 days. Feels like gas. She feels constipation. She was seen by her MD yesterday and told she might have diverticulitis.

## 2015-12-17 NOTE — ED Provider Notes (Signed)
CSN: [id number]     Arrival date & time 12/17/15  1048 History   First MD Initiated Contact with Patient 12/17/15 1058     Chief Complaint  Patient presents with  . Abdominal Pain   Patient is a 55 y.o. female presenting with abdominal pain.  Abdominal Pain Pain location:  LLQ Pain quality: cramping   Pain radiates to:  Does not radiate Pain severity:  Mild Duration:  3 days Timing:  Intermittent Progression:  Unchanged Context: suspicious food intake   Context: not previous surgeries, not sick contacts and not trauma   Relieved by:  Bowel activity Worsened by:  Movement Ineffective treatments:  None tried Associated symptoms: chills and diarrhea   Associated symptoms: no anorexia, no dysuria, no fever, no hematuria, no nausea, no vaginal discharge and no vomiting    Mandy Vargas is a 55 year old female with a past medical history of anemia, depression, nephrolithiasis and breast cancer presenting with abdominal pain and diarrhea. Onset of symptoms was 3 days ago. The pain is intermittent area she describes it as a cramping in her lower abdomen which is most prominent in the left lower quadrant. She endorses feeling "really gassy" and like she has to have a bowel movement when the pain starts. She has had multiple episodes of loose stools associated with the abdominal cramping. She reports significant improvement in pain after having a bowel movement. She has had some intermittent pain without associated diarrhea. She notes worsening of her pain when walking. Denies other exacerbating factors. She does note a suspicious food intake prior to symptom onset. She left some salmon in a hot car and then ate it. She was seen by her PCP yesterday for the same complaints. They did blood work which she reports as normal. She is concerned because her PCP told her she might have diverticulitis. Her PCP did not order any imaging yesterday. Denies fevers, headache, dizziness, syncope, URI symptoms, nausea,  vomiting, flank pain, hematuria, dysuria, malodorous urine, pelvic pain or vaginal discharge. She endorses some chills last evening.  Past Medical History  Diagnosis Date  . Wears glasses   . Bruises easily   . Depression   . Palpitations   . Anemia   . Breast cancer (HCC)     Left - DCIS  . Nephrolithiasis     Status post lithotripsy   Past Surgical History  Procedure Laterality Date  . Dilation and curettage of uterus  1993  . Kidney stone surgery    . Breast biopsy  12/22/11    DCIS w/calcifications  . Breast lumpectomy  02/19/12    Left breast, DCIS, ER/PR+   Family History  Problem Relation Age of Onset  . Colon cancer Mother   . Breast cancer Sister   . Cancer Sister 36    Breast  . Colon cancer Paternal Grandmother     62'S  . Anesthesia problems Neg Hx   . Cancer Maternal Aunt     Breast  . Heart attack Father 63    Deceased  . Healthy Son    Social History  Substance Use Topics  . Smoking status: Former Smoker -- 0.25 packs/day for 1 years    Types: Cigarettes    Quit date: 09/12/2011  . Smokeless tobacco: Never Used  . Alcohol Use: No   OB History    Gravida Para Term Preterm AB TAB SAB Ectopic Multiple Living   1 1  Obstetric Comments   MENARCHE AGE 37,  1st term pregnancy age 58, BCP until 2007     Review of Systems  Constitutional: Positive for chills. Negative for fever.  Gastrointestinal: Positive for abdominal pain and diarrhea. Negative for nausea, vomiting and anorexia.  Genitourinary: Negative for dysuria, hematuria and vaginal discharge.  All other systems reviewed and are negative.     Allergies  Review of patient's allergies indicates no known allergies.  Home Medications   Prior to Admission medications   Medication Sig Start Date End Date Taking? Authorizing Provider  buPROPion (WELLBUTRIN XL) 300 MG 24 hr tablet TAKE 1 TABLET (300 MG TOTAL) BY MOUTH DAILY. 06/28/15   Eustaquio Maize, MD  ciprofloxacin (CIPRO)  500 MG tablet Take 1 tablet (500 mg total) by mouth 2 (two) times daily. 12/17/15   Leeandre Nordling, PA-C  clindamycin (CLEOCIN) 300 MG capsule Take 1 capsule (300 mg total) by mouth 3 (three) times daily. 11/30/15   Mary-Margaret Hassell Done, FNP  escitalopram (LEXAPRO) 10 MG tablet Take 10 mg by mouth daily. 11/24/15   Historical Provider, MD  Magnesium 500 MG CAPS Take by mouth. Reported on 12/16/2015    Historical Provider, MD  metoprolol tartrate (LOPRESSOR) 25 MG tablet  10/04/15   Historical Provider, MD  metroNIDAZOLE (FLAGYL) 500 MG tablet Take 1 tablet (500 mg total) by mouth 3 (three) times daily. 12/17/15   Robecca Fulgham, PA-C  Multiple Vitamin (MULTIVITAMIN) tablet Take 1 tablet by mouth daily.    Historical Provider, MD  Red Yeast Rice Extract (RED YEAST RICE PO) Take by mouth. Reported on 12/16/2015    Historical Provider, MD  tamoxifen (NOLVADEX) 20 MG tablet TAKE 1 TABLET (20 MG TOTAL) BY MOUTH DAILY. 06/10/15   Nicholas Lose, MD   BP 102/38 mmHg  Pulse 96  Temp(Src) 98.2 F (36.8 C) (Oral)  Resp 18  Ht 5' 1.5" (1.562 m)  Wt 62.596 kg  BMI 25.66 kg/m2  SpO2 98%  LMP 12/13/2011 Physical Exam  Constitutional: She appears well-developed and well-nourished. No distress.  HENT:  Head: Normocephalic and atraumatic.  Eyes: Conjunctivae are normal. Right eye exhibits no discharge. Left eye exhibits no discharge. No scleral icterus.  Neck: Normal range of motion.  Cardiovascular: Normal rate and regular rhythm.   Pulmonary/Chest: Effort normal. No respiratory distress.  Abdominal: Normal appearance and bowel sounds are normal. There is tenderness in the left lower quadrant. There is no rigidity, no rebound and no guarding.  Mild tenderness in the left lower quadrant. No rebound, guarding or rigidity. No tenderness at McBurney's point. No CVA tenderness.  Musculoskeletal: Normal range of motion.  Neurological: She is alert. Coordination normal.  Skin: Skin is warm and dry.  Psychiatric: She has a  normal mood and affect. Her behavior is normal.  Nursing note and vitals reviewed.   ED Course  Procedures (including critical care time) Labs Review Labs Reviewed  CBC WITH DIFFERENTIAL/PLATELET - Abnormal; Notable for the following:    RBC 3.75 (*)    Hemoglobin 11.2 (*)    HCT 34.7 (*)    All other components within normal limits  COMPREHENSIVE METABOLIC PANEL - Abnormal; Notable for the following:    Potassium 3.4 (*)    Glucose, Bld 144 (*)    Calcium 8.4 (*)    Total Protein 6.1 (*)    Albumin 3.1 (*)    All other components within normal limits  LIPASE, BLOOD  OCCULT BLOOD X 1 CARD TO LAB, STOOL  URINALYSIS,  ROUTINE W REFLEX MICROSCOPIC (NOT AT [facility])    Imaging Review Ct Abdomen Pelvis W Contrast  12/17/2015  CLINICAL DATA:  Lower abdominal pain for 3 days, started with diarrhea on Monday, history nephrolithiasis, former smoker EXAM: CT ABDOMEN AND PELVIS WITH CONTRAST TECHNIQUE: Multidetector CT imaging of the abdomen and pelvis was performed using the standard protocol following bolus administration of intravenous contrast. Sagittal and coronal MPR images reconstructed from axial data set. CONTRAST:  167mL ISOVUE-300 IOPAMIDOL (ISOVUE-300) INJECTION 61% IV. Dilute oral contrast. COMPARISON:  None. FINDINGS: 11 x 7 mm diameter nodular focus at RIGHT diaphragm image 6. Lung bases otherwise clear. 12 x 8 mm nonspecific low-attenuation focus RIGHT lobe liver image 11. Liver, gallbladder, spleen, pancreas, kidneys, and adrenal glands otherwise normal appearance. Mildly enlarged nodular appearing uterus suspect containing multiple leiomyomata. Unremarkable ovaries, bladder, and ureters. Normal appearing retrocecal appendix. Marked nodular thickening of colon beginning at rectum and extending to ascending colon and cecum compatible with pancolitis, most severe at sigmoid colon. Minimal surrounding pericolic infiltrative changes throughout the length of the colon. No evidence of bowel  obstruction or discrete mass. Stomach and small bowel loops normal appearance. No mass, adenopathy, free air, free fluid, abscess or hernia. Mild degenerative disc disease changes lumbar spine. IMPRESSION: Pancolitis, question due to infection or inflammatory bowel disease, most severe at sigmoid colon. Single nonspecific low-attenuation liver lesion 12 x 8 mm. Question pleural based nodule versus pleural plaque at RIGHT diaphragm 11 x 7 mm in size, without prior exams available to demonstrate stability; recommend followup CT chest in 6 months to re-evaluate. Electronically Signed   By: Lavonia Dana M.D.   On: 12/17/2015 13:55   I have personally reviewed and evaluated these images and lab results as part of my medical decision-making.   EKG Interpretation None      MDM   Final diagnoses:  Pancolitis [facility])   55 year old female presenting with abdominal pain and diarrhea x 3 days. VSS. Patient is nontoxic, nonseptic appearing, in no apparent distress. Abdomen is soft with tenderness in LLQ. No peritoneal signs suggesting surgical abdomen. Patient's pain and other symptoms adequately managed in emergency department. Fluid bolus given. Labs, imaging and vitals reviewed. No leukocytosis. Hgb 11.2 which is slightly below baseline of 12. Negative hemoccult. CT abdomen shows pancolitis. Incidentally noted to hae pleural nodule vs plaque that will need follow up in 6 months. Patient does not meet the SIRS or Sepsis criteria and is appropriate for outpatient treatment. Patient discharged home with cipro and flagyl. I have also discussed the lung CT findings and provided her with a copy of the results. She understands that she will need to follow up with her PCP to schedule to follow up CT.  I have also discussed reasons to return immediately to the ER.  Patient expresses understanding and agrees with plan.     Lahoma Crocker Rockwell Zentz, PA-C 12/17/15 [place], MD 12/17/15 [id number]

## 2015-12-17 NOTE — ED Notes (Signed)
amb pt to BR

## 2015-12-17 NOTE — Discharge Instructions (Signed)
Follow up with your PCP regarding the lung findings on your CT scan. I have included the radiology report below. Take the antibiotics as prescribed and stay well hydrated.   IMPRESSION: Pancolitis, question due to infection or inflammatory bowel disease, most severe at sigmoid colon.  Single nonspecific low-attenuation liver lesion 12 x 8 mm.  Question pleural based nodule versus pleural plaque at RIGHT diaphragm 11 x 7 mm in size, without prior exams available to demonstrate stability; recommend followup CT chest in 6 months to re-evaluate.   Colitis Colitis is inflammation of the colon. Colitis may last a short time (acute) or it may last a long time (chronic). CAUSES This condition may be caused by:  Viruses.  Bacteria.  Reactions to medicine.  Certain autoimmune diseases, such as Crohn disease or ulcerative colitis. SYMPTOMS Symptoms of this condition include:  Diarrhea.  Passing bloody or tarry stool.  Pain.  Fever.  Vomiting.  Tiredness (fatigue).  Weight loss.  Bloating.  Sudden increase in abdominal pain.  Having fewer bowel movements than usual. DIAGNOSIS This condition is diagnosed with a stool test or a blood test. You may also have other tests, including X-rays, a CT scan, or a colonoscopy. TREATMENT Treatment may include:  Resting the bowel. This involves not eating or drinking for a period of time.  Fluids that are given through an IV tube.  Medicine for pain and diarrhea.  Antibiotic medicines.  Cortisone medicines.  Surgery. HOME CARE INSTRUCTIONS Eating and Drinking  Follow instructions from your health care provider about eating or drinking restrictions.  Drink enough fluid to keep your urine clear or pale yellow.  Work with a dietitian to determine which foods cause your condition to flare up.  Avoid foods that cause flare-ups.  Eat a well-balanced diet. Medicines  Take over-the-counter and prescription medicines only  as told by your health care provider.  If you were prescribed an antibiotic medicine, take it as told by your health care provider. Do not stop taking the antibiotic even if you start to feel better. General Instructions  Keep all follow-up visits as told by your health care provider. This is important. SEEK MEDICAL CARE IF:  Your symptoms do not go away.  You develop new symptoms. SEEK IMMEDIATE MEDICAL CARE IF:  You have a fever that does not go away with treatment.  You develop chills.  You have extreme weakness, fainting, or dehydration.  You have repeated vomiting.  You develop severe pain in your abdomen.  You pass bloody or tarry stool.   This information is not intended to replace advice given to you by your health care provider. Make sure you discuss any questions you have with your health care provider.   Document Released: 10/05/2004 Document Revised: 05/19/2015 Document Reviewed: 12/21/2014 Elsevier Interactive Patient Education [employer].

## 2015-12-17 NOTE — ED Notes (Signed)
Pt unable to void at this time due to the ABD pain. Pt asked to undress and put on gown. Husband at bedside.

## 2015-12-20 ENCOUNTER — Other Ambulatory Visit: Payer: Self-pay | Admitting: Pediatrics

## 2015-12-21 ENCOUNTER — Telehealth: Payer: Self-pay | Admitting: Pediatrics

## 2015-12-27 ENCOUNTER — Telehealth: Payer: Self-pay | Admitting: Pediatrics

## 2015-12-27 ENCOUNTER — Other Ambulatory Visit: Payer: Self-pay | Admitting: Nurse Practitioner

## 2015-12-27 MED ORDER — PROPRANOLOL HCL ER 60 MG PO CP24: 60.0000 mg | ORAL_CAPSULE | Freq: Every day | ORAL | Status: DC

## 2015-12-27 NOTE — Telephone Encounter (Signed)
Yes inderal is an option- does not work real well for blood pressure but dose help with headache prevention. i will send in rx for low dose

## 2015-12-27 NOTE — Telephone Encounter (Signed)
Pt is aware.  

## 2015-12-27 NOTE — Telephone Encounter (Signed)
Patient states that she has been having headaches for a year and she states that she had been seen several times for them and wants to know if it could possibly be her BP medication. Patient states she has tried inderal in the past and it helped could this be an option. Please advise.

## 2016-01-08 ENCOUNTER — Other Ambulatory Visit: Payer: Self-pay | Admitting: Pediatrics

## 2016-01-10 ENCOUNTER — Other Ambulatory Visit: Payer: Self-pay | Admitting: Nurse Practitioner

## 2016-01-10 ENCOUNTER — Telehealth: Payer: Self-pay

## 2016-01-10 NOTE — Telephone Encounter (Signed)
[facility] called to verify if patient should be taking lisinopril and propanolol. Did not see lisinopril on current med list so told them to just continue with propanolol. Just FYI

## 2016-01-11 ENCOUNTER — Telehealth: Payer: Self-pay | Admitting: Pediatrics

## 2016-01-11 NOTE — Telephone Encounter (Signed)
I spoke with the pt and she states she has never been on lisinopril. Called the pharmacy and they state they weren't calling about lisinopril but about metoprolol and propanolol. Verified that pt should only be taking propanolol. Pharmacist dc'd the metoprolol.

## 2016-01-11 NOTE — Telephone Encounter (Signed)
Pt was calling back in regards to the earlier phone call regarding her medication. Pt wanted to make sure she was taking all of her medications correctly. Reviewed her medications and pt verifed she is taking the medications on her current medication list.

## 2016-01-11 NOTE — Telephone Encounter (Signed)
l have only seen patient for acute problems- i do not see lisinopril on her med history so I am not sure where this came from- please see if patient knows who rx lisinopril.

## 2016-02-09 ENCOUNTER — Ambulatory Visit: Payer: BC Managed Care – PPO | Admitting: Neurology

## 2016-03-08 ENCOUNTER — Other Ambulatory Visit: Payer: Self-pay | Admitting: *Deleted

## 2016-03-08 MED ORDER — PROPRANOLOL HCL ER 60 MG PO CP24: 60.0000 mg | ORAL_CAPSULE | Freq: Every day | ORAL | Status: DC

## 2016-03-24 ENCOUNTER — Encounter: Payer: Self-pay | Admitting: Genetic Counselor

## 2016-04-05 ENCOUNTER — Encounter: Payer: Self-pay | Admitting: Pediatrics

## 2016-04-05 ENCOUNTER — Ambulatory Visit (INDEPENDENT_AMBULATORY_CARE_PROVIDER_SITE_OTHER): Payer: BC Managed Care – PPO | Admitting: Pediatrics

## 2016-04-05 VITALS — BP 120/70 | HR 77 | Temp 97.8°F | Ht 61.5 in | Wt 141.0 lb

## 2016-04-05 DIAGNOSIS — R110 Nausea: Secondary | ICD-10-CM | POA: Diagnosis not present

## 2016-04-05 DIAGNOSIS — Z6826 Body mass index (BMI) 26.0-26.9, adult: Secondary | ICD-10-CM | POA: Diagnosis not present

## 2016-04-05 DIAGNOSIS — M1711 Unilateral primary osteoarthritis, right knee: Secondary | ICD-10-CM

## 2016-04-05 DIAGNOSIS — M129 Arthropathy, unspecified: Secondary | ICD-10-CM | POA: Diagnosis not present

## 2016-04-05 DIAGNOSIS — R911 Solitary pulmonary nodule: Secondary | ICD-10-CM | POA: Diagnosis not present

## 2016-04-05 DIAGNOSIS — K5900 Constipation, unspecified: Secondary | ICD-10-CM | POA: Diagnosis not present

## 2016-04-05 MED ORDER — ONDANSETRON 4 MG PO TBDP: 4.0000 mg | ORAL_TABLET | Freq: Two times a day (BID) | ORAL | 0 refills | Status: DC | PRN

## 2016-04-05 NOTE — Patient Instructions (Signed)
Pro-biotics daily Miralax as needed Go back to fruits and vegetables regularly Avoid greasy foods Can take zofran in the morning if needed If still having nausea after above changes come back to be seen

## 2016-04-05 NOTE — Progress Notes (Signed)
    Subjective:    Patient ID: Mandy Vargas, female    DOB: [birth year], 55 y.o.   MRN: [id number]  CC: Nausea; and Constipation   HPI: Mandy Vargas is a 55 y.o. female presenting for Nausea; and Constipation  Got better from colitis from a couple of months ago Last few mornings has hypersalivation  Feels very nauseated in the morning Worse in the morning, gets better thorughout the day No vomiting Had been on a healthy diet Then 4 days ago started eating out a lot Lots of greasy foods, [place], [place] food with lots of cheese Has been taking probiotic, usually has regular stooling Last stool 2 days ago No abd pain, no worsening of symptoms while eating greasy foods.   No fevers Appetite is fairly normal  Having some cracking, popping of R knee Hurts at times with walking Working 2-5 this summer Not taking anything for it  On CT scan for abd in ED also found to have small just over 1cm lung nodule and liver nodule    Depression screen [facility] 2/9 04/05/2016 10/06/2015 05/13/2015 10/02/2013  Decreased Interest 0 0 1 0  Down, Depressed, Hopeless 0 0 1 0  PHQ - 2 Score 0 0 2 0   Relevant past medical, surgical, family and social history reviewed and updated as indicated.  Interim medical history since our last visit reviewed. Allergies and medications reviewed and updated.  ROS: Per HPI   History  Smoking Status  . Former Smoker  . Packs/day: 0.25  . Years: 1.00  . Types: Cigarettes  . Quit date: 09/12/2011  Smokeless Tobacco  . Never Used       Objective:    BP 120/70 (BP Location: Right Arm, Patient Position: Sitting, Cuff Size: Normal)   Pulse 77   Temp 97.8 F (36.6 C) (Oral)   Ht 5' 1.5" (1.562 m)   Wt 141 lb (64 kg)   LMP 12/13/2011   BMI 26.21 kg/m   Wt Readings from Last 3 Encounters:  04/05/16 141 lb (64 kg)  12/17/15 138 lb (62.6 kg)  12/16/15 138 lb (62.6 kg)     Gen: NAD, alert, cooperative with exam, NCAT EYES: EOMI, no scleral  injection or icterus ENT: OP without erythema LYMPH: no cervical LAD CV: NRRR, normal S1/S2, no murmur, distal pulses 2+ b/l Resp: CTABL, no wheezes, normal WOB Abd: +BS, soft, NTND. no guarding or organomegaly Ext: No edema, warm Neuro: Alert and oriented MSK: crepitus present R knee, no joint line tenderness     Assessment & Plan:    Mandy Vargas was seen today for nausea and constipation.  Diagnoses and all orders for this visit:  Nausea without vomiting Not clear etiology Only present in morning Has changed what she has been eating during the time of nausea, past 4 days Pt to go back to healthier diet, avoid greasy foods Constipation may be contributing If not improving over next few days, RTC -     ondansetron (ZOFRAN-ODT) 4 MG disintegrating tablet; Take 1 tablet (4 mg total) by mouth 2 (two) times daily as needed for nausea or vomiting.  BMI 26.0-26.9,adult Continue lifestyle changes, fruits, veg, activity  Lung nodule Needs repeat CT scan in 06/2016  Constipation, unspecified constipation type miralax daily as needed for daily sotoling  Arthritis of right knee NSAIDs, ice as needed   Follow up plan: Return in about 3 months (around 07/06/2016).  Assunta Found, MD [facility] 04/05/2016, 11:54 AM

## 2016-04-06 ENCOUNTER — Other Ambulatory Visit: Payer: Self-pay | Admitting: Pediatrics

## 2016-04-06 ENCOUNTER — Other Ambulatory Visit: Payer: BC Managed Care – PPO

## 2016-04-06 ENCOUNTER — Telehealth: Payer: Self-pay | Admitting: Pediatrics

## 2016-04-06 DIAGNOSIS — R110 Nausea: Secondary | ICD-10-CM

## 2016-04-06 NOTE — Telephone Encounter (Signed)
Pt states zofran has not helped with nausea. She had to make herself vomit this morning. She is trying to eat chx noodle soup & crackers. Can an abx be called in or other suggestions.

## 2016-04-06 NOTE — Progress Notes (Signed)
Still with nausea, will come by for lab draw today.  Little appetite. No diarrhea. Had stool this morning. No fevers.  If symptoms worsen overnight needs to go to emergency room.

## 2016-04-06 NOTE — Telephone Encounter (Signed)
Talked with pt, she will come in for lab draw.

## 2016-04-07 LAB — CMP14+EGFR
A/G RATIO: 1.5 (ref 1.2–2.2)
ALBUMIN: 4 g/dL (ref 3.5–5.5)
ALK PHOS: 50 IU/L (ref 39–117)
ALT: 19 IU/L (ref 0–32)
AST: 22 IU/L (ref 0–40)
BUN / CREAT RATIO: 22 (ref 9–23)
BUN: 17 mg/dL (ref 6–24)
CO2: 29 mmol/L (ref 18–29)
CREATININE: 0.78 mg/dL (ref 0.57–1.00)
Calcium: 9.5 mg/dL (ref 8.7–10.2)
Chloride: 100 mmol/L (ref 96–106)
GFR calc Af Amer: 98 mL/min/{1.73_m2} (ref >59)
GFR calc non Af Amer: 85 mL/min/{1.73_m2} (ref >59)
GLOBULIN, TOTAL: 2.7 g/dL (ref 1.5–4.5)
Glucose: 101 mg/dL — ABNORMAL HIGH (ref 65–99)
POTASSIUM: 4.6 mmol/L (ref 3.5–5.2)
SODIUM: 141 mmol/L (ref 134–144)
Total Protein: 6.7 g/dL (ref 6.0–8.5)

## 2016-04-07 LAB — CBC WITH DIFFERENTIAL/PLATELET
BASOS ABS: 0 10*3/uL (ref 0.0–0.2)
Basos: 0 %
EOS (ABSOLUTE): 0 10*3/uL (ref 0.0–0.4)
Eos: 1 %
Hematocrit: 40.2 % (ref 34.0–46.6)
Hemoglobin: 13.2 g/dL (ref 11.1–15.9)
IMMATURE GRANS (ABS): 0 10*3/uL (ref 0.0–0.1)
Immature Granulocytes: 0 %
LYMPHS ABS: 2 10*3/uL (ref 0.7–3.1)
Lymphs: 33 %
MCH: 29.7 pg (ref 26.6–33.0)
MCHC: 32.8 g/dL (ref 31.5–35.7)
MCV: 90 fL (ref 79–97)
MONOCYTES: 6 %
MONOS ABS: 0.4 10*3/uL (ref 0.1–0.9)
NEUTROS ABS: 3.6 10*3/uL (ref 1.4–7.0)
Neutrophils: 60 %
PLATELETS: 169 10*3/uL (ref 150–379)
RBC: 4.45 x10E6/uL (ref 3.77–5.28)
RDW: 13.1 % (ref 12.3–15.4)
WBC: 6 10*3/uL (ref 3.4–10.8)

## 2016-04-07 LAB — LIPASE: LIPASE: 38 U/L (ref 0–59)

## 2016-04-10 ENCOUNTER — Telehealth: Payer: Self-pay | Admitting: Pediatrics

## 2016-04-10 NOTE — Telephone Encounter (Signed)
Labs are normal, nothing to explain symptoms. Is she still having the nausea? We should get RUQ ultrasound.

## 2016-04-10 NOTE — Telephone Encounter (Signed)
Patient aware of results.  Patient states that she is feeling better.  She only has occasional abdominal pain and nausea.  Patient will call back if symptoms et worse to schedule [place] RUQ.

## 2016-04-10 NOTE — Telephone Encounter (Signed)
Patient's second time calling about lab results today. Explained to patient that we are just waiting on them to be reviewed by Dr.VIncent but that you are with patients and we will call her as soon as they are reviewed. Told patient I would send you a message letting you know she has called about them. Just an [place]

## 2016-04-25 ENCOUNTER — Other Ambulatory Visit: Payer: Self-pay | Admitting: Pediatrics

## 2016-04-25 DIAGNOSIS — R002 Palpitations: Secondary | ICD-10-CM

## 2016-05-08 ENCOUNTER — Other Ambulatory Visit: Payer: Self-pay | Admitting: Obstetrics and Gynecology

## 2016-05-08 DIAGNOSIS — N644 Mastodynia: Secondary | ICD-10-CM

## 2016-06-05 ENCOUNTER — Ambulatory Visit (HOSPITAL_BASED_OUTPATIENT_CLINIC_OR_DEPARTMENT_OTHER): Payer: BC Managed Care – PPO | Admitting: Hematology and Oncology

## 2016-06-05 ENCOUNTER — Encounter: Payer: Self-pay | Admitting: Hematology and Oncology

## 2016-06-05 DIAGNOSIS — D0512 Intraductal carcinoma in situ of left breast: Secondary | ICD-10-CM

## 2016-06-05 DIAGNOSIS — R413 Other amnesia: Secondary | ICD-10-CM

## 2016-06-05 DIAGNOSIS — C50412 Malignant neoplasm of upper-outer quadrant of left female breast: Secondary | ICD-10-CM

## 2016-06-05 NOTE — Assessment & Plan Note (Signed)
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 Patient is status post lumpectomy in June 2013 by Dr Lucia Gaskins. In October 2013 she was begun on tamoxifen 20 mg as chemoprevention.  Tamoxifen Toxicities: 1.  Memory problems 2. Endometrial Biopsy march 2015: Normal 3.  Hot flashes  Breast Cancer Surveillance: 1. Breast exam   06/05/2016: Normal  Apart from a small skin lesion right breast  Lower outer quadrant , patient will see dermatologist for this 2. Mammogram to be done on 06/09/2016.   RTC in 1 year and she will finish 5 years of tamoxifen therapy

## 2016-06-05 NOTE — Progress Notes (Signed)
Patient Care Team: Eustaquio Maize, MD as PCP - General (Pediatrics) Vernie Shanks, MD as Attending Physician (Family Medicine) Marcy Panning, MD as Consulting Physician (Hematology and Oncology) Thea Silversmith, MD as Consulting Physician (Radiation Oncology) Dian Queen, MD as Consulting Physician (Obstetrics and Gynecology)  DIAGNOSIS: DCIS (ductal carcinoma in situ) of breast, Left.   Staging form: Breast, AJCC 7th Edition   - Clinical: Stage 0 (Tis, N0, cM0) - Unsigned         Staging comments: Staged in Breast Conference 4.24.13    - Pathologic: No stage assigned - Unsigned  SUMMARY OF ONCOLOGIC HISTORY:   Breast cancer of upper-outer quadrant of left female breast ([place])   02/19/2012 Surgery    left breast  lumpectomy: 0.3 cm focus of DCIS with the tumor found to be ER +100% PR +100%, intermediate grade      05/01/2012 - 06/21/2012 Radiation Therapy    Adjuvant radiation in [place]       Procedure    Negative for BRCA1 and 2 genes      06/21/2012 -  Anti-estrogen oral therapy    Tamoxifen 20 mg daily 5 years       CHIEF COMPLIANT: Follow-up on tamoxifen  INTERVAL HISTORY: Mandy Vargas is a 29 year with above-mentioned history of DCIS in the left breast currently on tamoxifen therapy. She is tolerating it extremely well. She denies any new complaints. She no longer has hot flashes. She has occasional muscle aches and pains. She continues to have memory issues.  REVIEW OF SYSTEMS:   Constitutional: Denies fevers, chills or abnormal weight loss Eyes: Denies blurriness of vision Ears, nose, mouth, throat, and face: Denies mucositis or sore throat Respiratory: Denies cough, dyspnea or wheezes Cardiovascular: Denies palpitation, chest discomfort Gastrointestinal:  Denies nausea, heartburn or change in bowel habits Skin: Denies abnormal skin rashes Lymphatics: Denies new lymphadenopathy or easy bruising Neurological:Denies numbness, tingling or new  weaknesses Behavioral/Psych: Mood is stable, no new changes  Extremities: No lower extremity edema Breast: Changes from the breast related to prior breast surgery All other systems were reviewed with the patient and are negative.  I have reviewed the past medical history, past surgical history, social history and family history with the patient and they are unchanged from previous note.  ALLERGIES:  has No Known Allergies.  MEDICATIONS:  Current Outpatient Prescriptions  Medication Sig Dispense Refill  . escitalopram (LEXAPRO) 10 MG tablet Take 10 mg by mouth daily.  12  . Magnesium 500 MG CAPS Take by mouth. Reported on 12/16/2015    . metoprolol tartrate (LOPRESSOR) 25 MG tablet TAKE 1 TABLET (25 MG TOTAL) BY MOUTH 2 (TWO) TIMES DAILY. 60 tablet 5  . Multiple Vitamin (MULTIVITAMIN) tablet Take 1 tablet by mouth daily.    . ondansetron (ZOFRAN-ODT) 4 MG disintegrating tablet Take 1 tablet (4 mg total) by mouth 2 (two) times daily as needed for nausea or vomiting. 6 tablet 0  . propranolol ER (INDERAL LA) 60 MG 24 hr capsule TAKE 1 CAPSULE (60 MG TOTAL) BY MOUTH DAILY.  2  . tamoxifen (NOLVADEX) 20 MG tablet TAKE 1 TABLET (20 MG TOTAL) BY MOUTH DAILY. 90 tablet 3   No current facility-administered medications for this visit.     PHYSICAL EXAMINATION: ECOG PERFORMANCE STATUS: 1 - Symptomatic but completely ambulatory  Vitals:   06/05/16 1537  BP: (!) 118/94  Pulse: 69  Resp: 18  Temp: 98.2 F (36.8 C)   Filed Weights  06/05/16 1537  Weight: 141 lb (64 kg)    GENERAL:alert, no distress and comfortable SKIN: skin color, texture, turgor are normal, no rashes or significant lesions EYES: normal, Conjunctiva are pink and non-injected, sclera clear OROPHARYNX:no exudate, no erythema and lips, buccal mucosa, and tongue normal  NECK: supple, thyroid normal size, non-tender, without nodularity LYMPH:  no palpable lymphadenopathy in the cervical, axillary or inguinal LUNGS: clear to  auscultation and percussion with normal breathing effort HEART: regular rate & rhythm and no murmurs and no lower extremity edema ABDOMEN:abdomen soft, non-tender and normal bowel sounds MUSCULOSKELETAL:no cyanosis of digits and no clubbing  NEURO: alert & oriented x 3 with fluent speech, no focal motor/sensory deficits EXTREMITIES: No lower extremity edema BREAST: No palpable masses or nodules in either right or left breasts. No palpable axillary supraclavicular or infraclavicular adenopathy no breast tenderness or nipple discharge. (exam performed in the presence of a chaperone)  LABORATORY DATA:  I have reviewed the data as listed   Chemistry      Component Value Date/Time   NA 141 04/06/2016 1632   NA 141 06/07/2015 1432   K 4.6 04/06/2016 1632   K 4.0 06/07/2015 1432   CL 100 04/06/2016 1632   CL 103 01/13/2013 1428   CO2 29 04/06/2016 1632   CO2 28 06/07/2015 1432   BUN 17 04/06/2016 1632   BUN 25.9 06/07/2015 1432   CREATININE 0.78 04/06/2016 1632   CREATININE 1.1 06/07/2015 1432      Component Value Date/Time   CALCIUM 9.5 04/06/2016 1632   CALCIUM 9.3 06/07/2015 1432   ALKPHOS 50 04/06/2016 1632   ALKPHOS 55 06/07/2015 1432   AST 22 04/06/2016 1632   AST 26 06/07/2015 1432   ALT 19 04/06/2016 1632   ALT 22 06/07/2015 1432   BILITOT <0.2 04/06/2016 1632   BILITOT <0.30 06/07/2015 1432       Lab Results  Component Value Date   WBC 6.0 04/06/2016   HGB 11.2 (L) 12/17/2015   HCT 40.2 04/06/2016   MCV 90 04/06/2016   PLT 169 04/06/2016   NEUTROABS 3.6 04/06/2016     ASSESSMENT & PLAN:  Breast cancer of upper-outer quadrant of left female breast ([facility]) stage 0 (DCIS measuring 0.3 cm) tumor was ER/PR positive, intermediate grade. Patient is status post lumpectomy in June 2013 by Dr Lucia Gaskins. In October 2013 she was begun on tamoxifen 20 mg as chemoprevention.  Tamoxifen Toxicities: 1.  Memory problems 2. Endometrial Biopsy march 2015: Normal 3.  Hot  flashes  Breast Cancer Surveillance: 1. Breast exam   06/05/2016: Normal  Apart from a small skin lesion right breast  Lower outer quadrant , patient will see dermatologist for this 2. Mammogram to be done on 06/09/2016.   RTC in 1 year and she will finish 5 years of tamoxifen therapy   No orders of the defined types were placed in this encounter.  The patient has a good understanding of the overall plan. she agrees with it. she will call with any problems that may develop before the next visit here.   Rulon Eisenmenger, MD 06/05/16

## 2016-06-09 ENCOUNTER — Ambulatory Visit
Admission: RE | Admit: 2016-06-09 | Discharge: 2016-06-09 | Disposition: A | Discharge status: Home / Self Care | Payer: BC Managed Care – PPO | Source: Ambulatory Visit | Attending: Obstetrics and Gynecology | Admitting: Obstetrics and Gynecology

## 2016-06-09 DIAGNOSIS — N644 Mastodynia: Secondary | ICD-10-CM

## 2016-08-07 ENCOUNTER — Other Ambulatory Visit: Payer: Self-pay | Admitting: Hematology and Oncology

## 2016-08-07 DIAGNOSIS — C50912 Malignant neoplasm of unspecified site of left female breast: Secondary | ICD-10-CM

## 2016-10-02 ENCOUNTER — Encounter: Payer: Self-pay | Admitting: Family Medicine

## 2016-10-02 ENCOUNTER — Ambulatory Visit (INDEPENDENT_AMBULATORY_CARE_PROVIDER_SITE_OTHER): Payer: BC Managed Care – PPO | Admitting: Family Medicine

## 2016-10-02 VITALS — BP 118/64 | HR 68 | Temp 98.3°F | Ht 61.5 in | Wt 142.6 lb

## 2016-10-02 DIAGNOSIS — J209 Acute bronchitis, unspecified: Secondary | ICD-10-CM

## 2016-10-02 MED ORDER — CEFDINIR 300 MG PO CAPS: 300.0000 mg | ORAL_CAPSULE | Freq: Two times a day (BID) | ORAL | 0 refills | Status: DC

## 2016-10-02 MED ORDER — METHYLPREDNISOLONE ACETATE 80 MG/ML IJ SUSP
80.0000 mg | Freq: Once | INTRAMUSCULAR | Status: AC
  Administered 2016-10-02: 80 mg via INTRAMUSCULAR

## 2016-10-02 NOTE — Progress Notes (Signed)
   HPI  Patient presents today through the acute illness.  Patient She's had cough, nasal congestion, bilateral temporal headache, and malaise for about 4 days. She is in a very stressful time in her life that she's been taking care of her 58 year old mother who has Alzheimer's disease.  Her appetite is okay. She has normal breathing and denies any chest pain.  She also denies any body aches, fevers, or chills.  PMH: Smoking status noted ROS: Per HPI  Objective: BP 118/64   Pulse 68   Temp 98.3 F (36.8 C) (Oral)   Ht 5' 1.5" (1.562 m)   Wt 142 lb 9.6 oz (64.7 kg)   LMP 12/13/2011   BMI 26.51 kg/m  Gen: NAD, alert, cooperative with exam HEENT: NCAT, oropharynx moist and clear, TMs normal bilaterally, no facial tenderness to palpation of the maxillary frontal sinuses, nares clear CV: RRR, good S1/S2, no murmur Resp: CTABL, no wheezes, non-labored Ext: No edema, warm Neuro: Alert and oriented, No gross deficits  Assessment and plan:  # Acute bronchitis Likely viral etiology which was discussed in detail with the patient. Given IM Depo-Medrol for symptomatically relief. Recommended waiting 3-4 days and then starting Omnicef if symptoms are not improving as expected. Discussed supportive care including Tylenol fluids and rest.   Meds ordered this encounter  Medications  . cefdinir (OMNICEF) 300 MG capsule    Sig: Take 1 capsule (300 mg total) by mouth 2 (two) times daily. 1 po BID    Dispense:  20 capsule    Refill:  [place], MD Tristan Schroeder [facility] 10/02/2016, 3:49 PM

## 2016-10-02 NOTE — Patient Instructions (Signed)
Great to meet you!  Be sure to finish all antibiotics.   Please call or come back with any concerns   Acute Bronchitis, Adult Acute bronchitis is when air tubes (bronchi) in the lungs suddenly get swollen. The condition can make it hard to breathe. It can also cause these symptoms:  A cough.  Coughing up clear, yellow, or green mucus.  Wheezing.  Chest congestion.  Shortness of breath.  A fever.  Body aches.  Chills.  A sore throat. Follow these instructions at home: Medicines  Take over-the-counter and prescription medicines only as told by your doctor.  If you were prescribed an antibiotic medicine, take it as told by your doctor. Do not stop taking the antibiotic even if you start to feel better. General instructions  Rest.  Drink enough fluids to keep your pee (urine) clear or pale yellow.  Avoid smoking and secondhand smoke. If you smoke and you need help quitting, ask your doctor. Quitting will help your lungs heal faster.  Use an inhaler, cool mist vaporizer, or humidifier as told by your doctor.  Keep all follow-up visits as told by your doctor. This is important. How is this prevented? To lower your risk of getting this condition again:  Wash your hands often with soap and water. If you cannot use soap and water, use hand sanitizer.  Avoid contact with people who have cold symptoms.  Try not to touch your hands to your mouth, nose, or eyes.  Make sure to get the flu shot every year. Contact a doctor if:  Your symptoms do not get better in 2 weeks. Get help right away if:  You cough up blood.  You have chest pain.  You have very bad shortness of breath.  You become dehydrated.  You faint (pass out) or keep feeling like you are going to pass out.  You keep throwing up (vomiting).  You have a very bad headache.  Your fever or chills gets worse. This information is not intended to replace advice given to you by your health care provider.  Make sure you discuss any questions you have with your health care provider. Document Released: 02/14/2008 Document Revised: 04/05/2016 Document Reviewed: 02/16/2016 Elsevier Interactive Patient Education  2017 [employer].

## 2016-10-02 NOTE — Addendum Note (Signed)
Addended by: Karle Plumber on: 10/02/2016 04:13 PM   Modules accepted: Orders

## 2017-01-10 ENCOUNTER — Ambulatory Visit: Payer: BC Managed Care – PPO | Admitting: Family Medicine

## 2017-01-11 ENCOUNTER — Ambulatory Visit (INDEPENDENT_AMBULATORY_CARE_PROVIDER_SITE_OTHER): Payer: BC Managed Care – PPO | Admitting: Pediatrics

## 2017-01-11 ENCOUNTER — Encounter: Payer: Self-pay | Admitting: Pediatrics

## 2017-01-11 VITALS — BP 110/70 | HR 56 | Temp 98.0°F | Ht 61.5 in | Wt 141.4 lb

## 2017-01-11 DIAGNOSIS — G44209 Tension-type headache, unspecified, not intractable: Secondary | ICD-10-CM

## 2017-01-11 DIAGNOSIS — J3089 Other allergic rhinitis: Secondary | ICD-10-CM

## 2017-01-11 MED ORDER — CETIRIZINE HCL 10 MG PO TABS: 10.0000 mg | ORAL_TABLET | Freq: Every day | ORAL | 11 refills | Status: DC

## 2017-01-11 MED ORDER — FLUTICASONE PROPIONATE 50 MCG/ACT NA SUSP: 2.0000 | Freq: Every day | NASAL | 6 refills | Status: DC

## 2017-01-11 NOTE — Progress Notes (Signed)
  Subjective:   Patient ID: Mandy Vargas, female    DOB: 24-Sep-1960, 56 y.o.   MRN: [id number] CC: Headache and Memory Loss  HPI: Mandy Vargas is a 56 y.o. female presenting for Headache and Memory Loss  Increased stress with mom being sick Headaches happening every day, hurt in her temples Taking aleve, ibuprofen, or tylenol about 4 days Starts in the morning, gets worse throughout the day There most days Has had some sinus drainage Is worried she has allergies/sinus problems causing pain  Drinking plenty of fluids Mom with alzheimers   Relevant past medical, surgical, family and social history reviewed. Allergies and medications reviewed and updated. History  Smoking Status  . Former Smoker  . Packs/day: 0.25  . Years: 1.00  . Types: Cigarettes  . Quit date: 09/12/2011  Smokeless Tobacco  . Never Used   ROS: Per HPI   Objective:    BP 110/70   Pulse (!) 56   Temp 98 F (36.7 C) (Oral)   Ht 5' 1.5" (1.562 m)   Wt 141 lb 6.4 oz (64.1 kg)   LMP 12/13/2011   BMI 26.28 kg/m   Wt Readings from Last 3 Encounters:  01/11/17 141 lb 6.4 oz (64.1 kg)  10/02/16 142 lb 9.6 oz (64.7 kg)  06/05/16 141 lb (64 kg)    Gen: NAD, alert, cooperative with exam, NCAT EYES: EOMI, no conjunctival injection, or no icterus ENT:  TMs pearly gray b/l, OP without erythema, no ttp over sinuses LYMPH: no cervical LAD CV: NRRR, normal S1/S2, no murmur, distal pulses 2+ b/l Resp: CTABL, no wheezes, normal WOB Abd: +BS, soft, NTND. Neuro: Alert and oriented, strength equal b/l UE and LE, sensation intact b/l face, UE, LE, coordination grossly normal CN III-XII intact   Assessment & Plan:  Temp[place] was seen today for headache.  Diagnoses and all orders for this visit:  Tension headache Daily, says able to continue normal activities Rebound headache may be contributing given frequency of OTC meds Discussed options including ppx medication Pt wants to try treating allergies first No  more than 2 days of OTC med taking in a week  Allergic rhinitis due to other allergic trigger, unspecified seasonality -     fluticasone (FLONASE) 50 MCG/ACT nasal spray; Place 2 sprays into both nostrils daily. -     cetirizine (ZYRTEC) 10 MG tablet; Take 1 tablet (10 mg total) by mouth daily.   Follow up plan: Return in about 2 weeks (around 01/25/2017). Assunta Found, MD [place]

## 2017-02-11 ENCOUNTER — Other Ambulatory Visit: Payer: Self-pay | Admitting: Pediatrics

## 2017-02-11 DIAGNOSIS — R002 Palpitations: Secondary | ICD-10-CM

## 2017-02-19 ENCOUNTER — Encounter: Payer: Self-pay | Admitting: Pediatrics

## 2017-02-19 ENCOUNTER — Ambulatory Visit (INDEPENDENT_AMBULATORY_CARE_PROVIDER_SITE_OTHER): Payer: BC Managed Care – PPO | Admitting: Pediatrics

## 2017-02-19 VITALS — BP 96/64 | HR 70 | Temp 98.2°F | Ht 61.5 in | Wt 142.4 lb

## 2017-02-19 DIAGNOSIS — R413 Other amnesia: Secondary | ICD-10-CM | POA: Diagnosis not present

## 2017-02-19 DIAGNOSIS — F418 Other specified anxiety disorders: Secondary | ICD-10-CM

## 2017-02-19 DIAGNOSIS — G44209 Tension-type headache, unspecified, not intractable: Secondary | ICD-10-CM | POA: Diagnosis not present

## 2017-02-19 MED ORDER — VENLAFAXINE HCL 25 MG PO TABS: 25.0000 mg | ORAL_TABLET | Freq: Two times a day (BID) | ORAL | 2 refills | Status: DC

## 2017-02-19 NOTE — Patient Instructions (Addendum)
Stop citalopram, cont lexapro for 6 days Cut lexapro in half for six days Start venlafaxine 25mg  twice a day and stop the lexapro

## 2017-02-19 NOTE — Progress Notes (Signed)
Subjective:   Patient ID: Mandy Vargas, female    DOB: 09-19-1960, 56 y.o.   MRN: [id number] CC: Dementia (testing, still having headaches)  HPI: Mandy Vargas is a 56 y.o. female presenting for Dementia (testing, still having headaches)  Headache: Ongoing daily headaches Gets worse at night Hurts b/l temples OTC meds every other day usually Had tooth removed two weeks ago, has had pain L side of jaw/sometimes ear since then, taking hydrocodone some for tooth pain No headache in the morning, gets worse throughout the day  Memory problem: looks at the clock to give her mom eye drops every five minutes, noticed she has a hard time remembering what the time is from one room to another Son tells her she forgets things a lot Doesn't get lost when driving She thinks her depression is affecting her memory  Anxiety and depression: Out of work since Thanksgiving bc mom needs full time care Mood has been down Feels safe at home Husband has been supportive but his mom recently passed away, has been traveling a lot  No chest palpitations  MMSE - [place] Exam 02/19/2017 02/19/2017 02/19/2017  Orientation to time - - 4  Orientation to Place - - 5  Registration - - 3  Attention/ Calculation 5 - 5  Recall - - 3  Language- name 2 objects - - 2  Language- repeat - - 1  Language- follow 3 step command - - 3  Language- read & follow direction - 1 1  Write a sentence - 1 1  Copy design - - 1  Total score - - 29     Relevant past medical, surgical, family and social history reviewed. Allergies and medications reviewed and updated. History  Smoking Status  . Former Smoker  . Packs/day: 0.25  . Years: 1.00  . Types: Cigarettes  . Quit date: 09/12/2011  Smokeless Tobacco  . Never Used   ROS: Per HPI   Objective:    BP 96/64   Pulse 70   Temp 98.2 F (36.8 C) (Oral)   Ht 5' 1.5" (1.562 m)   Wt 142 lb 6.4 oz (64.6 kg)   LMP 12/13/2011   BMI 26.47 kg/m   Wt Readings  from Last 3 Encounters:  02/19/17 142 lb 6.4 oz (64.6 kg)  01/11/17 141 lb 6.4 oz (64.1 kg)  10/02/16 142 lb 9.6 oz (64.7 kg)    Gen: NAD, alert, cooperative with exam, NCAT EYES: EOMI, no conjunctival injection, or no icterus ENT:  OP without erythema LYMPH: no cervical LAD CV: NRRR, normal S1/S2, no murmur, distal pulses 2+ b/l Resp: CTABL, no wheezes, normal WOB Abd: soft, NTND. no guarding or organomegaly Ext: No edema, warm Neuro: Alert and oriented, strength equal b/l UE and LE, PERRL, coordination grossly normal Psych: normal affect  Assessment & Plan:  Mandy Vargas was seen today for dementia.  Diagnoses and all orders for this visit:  Tension headache Has been tried on amitriptyline, muscle relaxant in past with minimal improvement Avoid daily OTC/abortive medicine use Starting venlafaxine as below If not improving will refer to neurology  Memory problem MMSE 29, pt says she is thinking about mood and depression so much, may be affecting her memory Treat depression with below Further eval if below not helping  Anxiety associated with depression Ongoing symptoms, feels safe at home, no thoughts of self harm Gave list for counselors, pt to call to set up Stop celexa Taper off of lexapro Then start below -  venlafaxine (EFFEXOR) 25 MG tablet; Take 1 tablet (25 mg total) by mouth 2 (two) times daily with a meal.   Follow up plan: 2 mo Assunta Found, MD [place]

## 2017-03-07 ENCOUNTER — Telehealth: Payer: Self-pay | Admitting: Pediatrics

## 2017-03-07 NOTE — Telephone Encounter (Signed)
Returned call, not noticed much improvement in mood with venlafaxine, no other symptoms, nervous about increasing dose now, wants to wait another week. Feels safe at home, no thoughts of self harm.  Pt to call back in a week with symptoms.

## 2017-03-07 NOTE — Telephone Encounter (Signed)
Can check BP at home over next week, take BP med one pill a day

## 2017-03-07 NOTE — Telephone Encounter (Signed)
Patient notified. Does not have a way to check BP at home. Advised patient she could come by here and we would check. Patient verbalized understanding

## 2017-03-07 NOTE — Telephone Encounter (Signed)
Patient was started on Effexor 25mg  on 6/11. Patient states that it has been giving her more anxiety and depression. No suicidal thoughts. Wanting to stick it out a little longer to see if it gets better. Wanting to let Dr. Evette Doffing know.

## 2017-03-12 ENCOUNTER — Ambulatory Visit (INDEPENDENT_AMBULATORY_CARE_PROVIDER_SITE_OTHER): Payer: BC Managed Care – PPO

## 2017-03-12 ENCOUNTER — Telehealth: Payer: Self-pay

## 2017-03-12 VITALS — BP 102/58 | HR 84

## 2017-03-12 DIAGNOSIS — F418 Other specified anxiety disorders: Secondary | ICD-10-CM

## 2017-03-12 NOTE — Telephone Encounter (Signed)
Patient has been taking Effexor with no lexapro for 3 days.  Patient aware effexor can be increased after taking for 2 weeks.  Per Dr. Evette Doffing.  Patient verbalized understanding.

## 2017-03-12 NOTE — Telephone Encounter (Signed)
Patient would like to discuss the Effexor with you.  She has had episodes of crying and anxiety.  This was during the time she was transitioning from [place].  She feels that the medication is working but has concerns about increasing it.  Would like for you to call her if possible.

## 2017-03-12 NOTE — Progress Notes (Signed)
Patient here today to have blood pressure checked since beginning new medication, Effexor.

## 2017-03-17 ENCOUNTER — Telehealth: Payer: Self-pay | Admitting: Pediatrics

## 2017-03-17 ENCOUNTER — Other Ambulatory Visit: Payer: Self-pay | Admitting: Family

## 2017-03-17 MED ORDER — VORTIOXETINE HBR 10 MG PO TABS: 10.0000 mg | ORAL_TABLET | Freq: Every day | ORAL | 1 refills | Status: DC

## 2017-03-17 MED ORDER — ESCITALOPRAM OXALATE 20 MG PO TABS: 20.0000 mg | ORAL_TABLET | Freq: Every day | ORAL | 1 refills | Status: DC

## 2017-03-17 NOTE — Progress Notes (Signed)
PT does not want to try trintellix and is requesting Lexapro Prescription. States she will follow up with Dr. Evette Doffing next week.

## 2017-03-17 NOTE — Telephone Encounter (Signed)
Pt does not want to start anything new! Please call in old Lexapro rx and she will follow up next week with Evette Doffing

## 2017-03-17 NOTE — Telephone Encounter (Signed)
Stop Effexor and start Trintellix. Prescription sent to pharmacy. PT needs to follow up with Dr. Evette Doffing in the next 1-2 weeks. PT to go to ED if she is having any SI.

## 2017-03-20 ENCOUNTER — Telehealth: Payer: Self-pay | Admitting: Pediatrics

## 2017-03-20 NOTE — Telephone Encounter (Signed)
noted 

## 2017-03-23 ENCOUNTER — Telehealth: Payer: Self-pay | Admitting: Pediatrics

## 2017-03-23 NOTE — Telephone Encounter (Signed)
Per pt since d/cing the Effexor she still feels very jittery and anxious Also is very nauseated Pt has restarted the Lexapro Pt informed she would need appt but she wants to know what she can do until she can come for appt Pt states she has Valium to take for insomnia but not on pt's current med list Please review and advise

## 2017-03-23 NOTE — Telephone Encounter (Signed)
It can take several days for the medicine to get both in and out of her system. May feel off for about a week. She can take half a tab of the lexapro for now, some of the jitteriness may be coming from restarting it but that should get better with time.

## 2017-03-23 NOTE — Telephone Encounter (Signed)
Pt notified of recommendation Verbalizes understanding 

## 2017-04-10 ENCOUNTER — Telehealth: Payer: Self-pay | Admitting: Pediatrics

## 2017-04-10 DIAGNOSIS — F419 Anxiety disorder, unspecified: Secondary | ICD-10-CM

## 2017-04-10 MED ORDER — BUSPIRONE HCL 7.5 MG PO TABS: 7.5000 mg | ORAL_TABLET | Freq: Two times a day (BID) | ORAL | 2 refills | Status: DC

## 2017-04-10 NOTE — Telephone Encounter (Signed)
lmtcb jkp 7/31

## 2017-04-10 NOTE — Telephone Encounter (Signed)
Pt taking care of mom with alzheimer's Has been taking Lexapro for a long time may not be working as well Can this be increased, she has taken wellbutrin and buspar in the past Does not want to try anything new d/t side effects Please advise

## 2017-04-10 NOTE — Telephone Encounter (Signed)
Just to clarify, is she discontinuing the Lexapro and starting Buspar or should she add the Buspar to the Lexapro?

## 2017-04-10 NOTE — Telephone Encounter (Signed)
Add the buspar, cont the lexapro

## 2017-04-10 NOTE — Telephone Encounter (Signed)
Pt notified of recommendation Verbalizes understanding 

## 2017-05-24 ENCOUNTER — Other Ambulatory Visit: Payer: Self-pay | Admitting: Obstetrics and Gynecology

## 2017-05-24 DIAGNOSIS — Z853 Personal history of malignant neoplasm of breast: Secondary | ICD-10-CM

## 2017-05-27 ENCOUNTER — Other Ambulatory Visit: Payer: Self-pay | Admitting: Pediatrics

## 2017-05-27 DIAGNOSIS — R002 Palpitations: Secondary | ICD-10-CM

## 2017-06-05 ENCOUNTER — Ambulatory Visit (HOSPITAL_BASED_OUTPATIENT_CLINIC_OR_DEPARTMENT_OTHER): Payer: BC Managed Care – PPO | Admitting: Hematology and Oncology

## 2017-06-05 DIAGNOSIS — Z86000 Personal history of in-situ neoplasm of breast: Secondary | ICD-10-CM | POA: Diagnosis not present

## 2017-06-05 DIAGNOSIS — D0512 Intraductal carcinoma in situ of left breast: Secondary | ICD-10-CM

## 2017-06-05 NOTE — Assessment & Plan Note (Signed)
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 Patient is status post lumpectomy in June 2013 by Dr Lucia Gaskins. In October 2013 she was begun on tamoxifen 20 mg as chemoprevention.  Tamoxifen Toxicities: 1. Memory problems 2. Endometrial Biopsy march 2015: Normal 3. Hot flashes  Breast Cancer Surveillance: 1. Breast exam  06/05/2017: Normal  2. Mammogram on 09/29/201: No mammographic evidence of malignancy, breast density category B  RTC in 1 year to follow-up with survivorship clinic

## 2017-06-05 NOTE — Progress Notes (Signed)
Patient Care Team: Mandy Maize, MD as PCP - General (Pediatrics) Mandy Shanks, MD as Attending Physician (Family Medicine) Mandy Panning, MD as Consulting Physician (Hematology and Oncology) Mandy Silversmith, MD (Inactive) as Consulting Physician (Radiation Oncology) Mandy Queen, MD as Consulting Physician (Obstetrics and Gynecology)  DIAGNOSIS:  Encounter Diagnosis  Name Primary?  . Ductal carcinoma in situ (DCIS) of left breast     SUMMARY OF ONCOLOGIC HISTORY:   Breast cancer of upper-outer quadrant of left female breast (Mandy Vargas)   02/19/2012 Surgery    left breast  lumpectomy: 0.3 cm focus of DCIS with the tumor found to be ER +100% PR +100%, intermediate grade      05/01/2012 - 06/21/2012 Radiation Therapy    Adjuvant radiation in [place]       Procedure    Negative for BRCA1 and 2 genes      06/21/2012 - 06/05/2017 Anti-estrogen oral therapy    Tamoxifen 20 mg daily 5 years       CHIEF COMPLIANT: Follow-up after completion of 5 years of tamoxifen  INTERVAL HISTORY: Mandy Vargas is a 56 year old with above-mentioned history of left breast DCIS currently on tamoxifen therapy for the past 5 years. She has had hot flashes and some muscle aches and pains but finally was able to complete all 5 years of treatment.  REVIEW OF SYSTEMS:   Constitutional: Denies fevers, chills or abnormal weight loss Eyes: Denies blurriness of vision Ears, nose, mouth, throat, and face: Denies mucositis or sore throat Respiratory: Denies cough, dyspnea or wheezes Cardiovascular: Denies palpitation, chest discomfort Gastrointestinal:  Denies nausea, heartburn or change in bowel habits Skin: Denies abnormal skin rashes Lymphatics: Denies new lymphadenopathy or easy bruising Neurological:Denies numbness, tingling or new weaknesses Behavioral/Psych: Mood is stable, no new changes  Extremities: No lower extremity edema Breast:  denies any pain or lumps or nodules in either  breasts All other systems were reviewed with the patient and are negative.  I have reviewed the past medical history, past surgical history, social history and family history with the patient and they are unchanged from previous note.  ALLERGIES:  is allergic to amitriptyline.  MEDICATIONS:  Current Outpatient Prescriptions  Medication Sig Dispense Refill  . busPIRone (BUSPAR) 7.5 MG tablet Take 1 tablet (7.5 mg total) by mouth 2 (two) times daily. 60 tablet 2  . escitalopram (LEXAPRO) 20 MG tablet Take 1 tablet (20 mg total) by mouth daily. 90 tablet 1  . metoprolol tartrate (LOPRESSOR) 25 MG tablet TAKE 1 TABLET (25 MG TOTAL) BY MOUTH 2 (TWO) TIMES DAILY. 60 tablet 2  . Multiple Vitamin (MULTIVITAMIN) tablet Take 1 tablet by mouth daily.     No current facility-administered medications for this visit.     PHYSICAL EXAMINATION: ECOG PERFORMANCE STATUS: 0 - Asymptomatic  Vitals:   06/05/17 1505  BP: 101/64  Pulse: 68  Resp: 18  Temp: 98.2 F (36.8 C)  SpO2: 100%   Filed Weights   06/05/17 1505  Weight: 142 lb 1.6 oz (64.5 kg)    GENERAL:alert, no distress and comfortable SKIN: skin color, texture, turgor are normal, no rashes or significant lesions EYES: normal, Conjunctiva are pink and non-injected, sclera clear OROPHARYNX:no exudate, no erythema and lips, buccal mucosa, and tongue normal  NECK: supple, thyroid normal size, non-tender, without nodularity LYMPH:  no palpable lymphadenopathy in the cervical, axillary or inguinal LUNGS: clear to auscultation and percussion with normal breathing effort HEART: regular rate & rhythm and no murmurs and no  lower extremity edema ABDOMEN:abdomen soft, non-tender and normal bowel sounds MUSCULOSKELETAL:no cyanosis of digits and no clubbing  NEURO: alert & oriented x 3 with fluent speech, no focal motor/sensory deficits EXTREMITIES: No lower extremity edema BREAST: No palpable masses or nodules in either right or left breasts. No  palpable axillary supraclavicular or infraclavicular adenopathy no breast tenderness or nipple discharge. (exam performed in the presence of a chaperone)  LABORATORY DATA:  I have reviewed the data as listed   Chemistry      Component Value Date/Time   NA 141 04/06/2016 1632   NA 141 06/07/2015 1432   K 4.6 04/06/2016 1632   K 4.0 06/07/2015 1432   CL 100 04/06/2016 1632   CL 103 01/13/2013 1428   CO2 29 04/06/2016 1632   CO2 28 06/07/2015 1432   BUN 17 04/06/2016 1632   BUN 25.9 06/07/2015 1432   CREATININE 0.78 04/06/2016 1632   CREATININE 1.1 06/07/2015 1432      Component Value Date/Time   CALCIUM 9.5 04/06/2016 1632   CALCIUM 9.3 06/07/2015 1432   ALKPHOS 50 04/06/2016 1632   ALKPHOS 55 06/07/2015 1432   AST 22 04/06/2016 1632   AST 26 06/07/2015 1432   ALT 19 04/06/2016 1632   ALT 22 06/07/2015 1432   BILITOT <0.2 04/06/2016 1632   BILITOT <0.30 06/07/2015 1432       Lab Results  Component Value Date   WBC 6.0 04/06/2016   HGB 13.2 04/06/2016   HCT 40.2 04/06/2016   MCV 90 04/06/2016   PLT 169 04/06/2016   NEUTROABS 3.6 04/06/2016    ASSESSMENT & PLAN:  DCIS (ductal carcinoma in situ) of breast, Left. 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 Patient is status post lumpectomy in June 2013 by Dr Mandy Vargas. In October 2013 she was begun on tamoxifen 20 mg as chemoprevention.  Tamoxifen Toxicities: 1. Memory problems 2. Endometrial Biopsy march 2015: Normal 3. Hot flashes Patient completed 5 years of tamoxifen therapy. I will discontinue tamoxifen at this point.  Breast Cancer Surveillance: 1. Breast exam  06/05/2017: Normal  2. Mammogram on 09/29/201: No mammographic evidence of malignancy, breast density category B   I offered her follow-up with survivorship clinic but she wants to follow with her gynecologist instead. We will see her on an as-needed basis.  I spent 15 minutes talking to the patient of which  more than half was spent in counseling and coordination of care.  No orders of the defined types were placed in this encounter.  The patient has a good understanding of the overall plan. she agrees with it. she will call with any problems that may develop before the next visit here.   Mandy Vargas Eisenmenger, MD 06/05/17

## 2017-06-12 ENCOUNTER — Ambulatory Visit
Admission: RE | Admit: 2017-06-12 | Discharge: 2017-06-12 | Disposition: A | Discharge status: Home / Self Care | Payer: BC Managed Care – PPO | Source: Ambulatory Visit | Attending: Obstetrics and Gynecology | Admitting: Obstetrics and Gynecology

## 2017-06-12 DIAGNOSIS — Z853 Personal history of malignant neoplasm of breast: Secondary | ICD-10-CM

## 2017-07-02 ENCOUNTER — Other Ambulatory Visit: Payer: Self-pay | Admitting: *Deleted

## 2017-07-02 DIAGNOSIS — F419 Anxiety disorder, unspecified: Secondary | ICD-10-CM

## 2017-07-02 MED ORDER — BUSPIRONE HCL 7.5 MG PO TABS: 7.5000 mg | ORAL_TABLET | Freq: Two times a day (BID) | ORAL | 1 refills | Status: DC

## 2017-07-06 ENCOUNTER — Other Ambulatory Visit: Payer: Self-pay | Admitting: Pediatrics

## 2017-07-06 DIAGNOSIS — F419 Anxiety disorder, unspecified: Secondary | ICD-10-CM

## 2017-07-06 NOTE — Telephone Encounter (Signed)
Last seen 02/19/17  Dr Evette Doffing

## 2017-07-13 ENCOUNTER — Ambulatory Visit (INDEPENDENT_AMBULATORY_CARE_PROVIDER_SITE_OTHER): Payer: BC Managed Care – PPO | Admitting: Family Medicine

## 2017-07-13 VITALS — BP 103/68 | HR 73 | Temp 98.9°F | Ht 61.0 in | Wt 140.0 lb

## 2017-07-13 DIAGNOSIS — J069 Acute upper respiratory infection, unspecified: Secondary | ICD-10-CM | POA: Diagnosis not present

## 2017-07-13 DIAGNOSIS — J029 Acute pharyngitis, unspecified: Secondary | ICD-10-CM | POA: Diagnosis not present

## 2017-07-13 LAB — RAPID STREP SCREEN (MED CTR MEBANE ONLY): STREP GP A AG, IA W/REFLEX: NEGATIVE

## 2017-07-13 LAB — CULTURE, GROUP A STREP

## 2017-07-13 MED ORDER — BENZONATATE 200 MG PO CAPS: 200.0000 mg | ORAL_CAPSULE | Freq: Two times a day (BID) | ORAL | 0 refills | Status: DC | PRN

## 2017-07-13 NOTE — Progress Notes (Signed)
Subjective: CC: cough PCP: Eustaquio Maize, MD [facility]:Mandy Vargas is a 56 y.o. female presenting to clinic today for:  1. Cold symptoms  Patient reports cough, sore throat that started a couple of days ago.  Denies sinus pressure, facial pain, purulence from nares, headache, SOB, dizziness, rash, nausea, vomiting, diarrhea, fevers, chills, myalgia, sick contacts, recent travel.  Patient has used salt water gargles and Tylenol sinus with some relief of symptoms.  Denies history of COPD or asthma.  Allergies  Allergen Reactions  . Amitriptyline     Tried 10mg  in past, made her feel "out of it"   Past Medical History:  Diagnosis Date  . Anemia   . Breast cancer (HCC)    Left - DCIS  . Bruises easily   . Depression   . Nephrolithiasis    Status post lithotripsy  . Palpitations   . Wears glasses    Family History  Problem Relation Age of Onset  . Colon cancer Mother   . Breast cancer Sister   . Cancer Sister 81       Breast  . Heart attack Father 76       Deceased  . Cancer Maternal Aunt        Breast  . Colon cancer Paternal Grandmother        27'S  . Healthy Son   . Anesthesia problems Neg Hx     Current Outpatient Prescriptions:  .  buPROPion (WELLBUTRIN SR) 150 MG 12 hr tablet, Take 150 mg by mouth., Disp: , Rfl:  .  busPIRone (BUSPAR) 7.5 MG tablet, TAKE 1 TABLET (7.5 MG TOTAL) BY MOUTH 2 (TWO) TIMES DAILY., Disp: 60 tablet, Rfl: 2 .  metoprolol tartrate (LOPRESSOR) 25 MG tablet, TAKE 1 TABLET (25 MG TOTAL) BY MOUTH 2 (TWO) TIMES DAILY., Disp: 60 tablet, Rfl: 2 .  Multiple Vitamin (MULTIVITAMIN) tablet, Take 1 tablet by mouth daily., Disp: , Rfl:  .  benzonatate (TESSALON) 200 MG capsule, Take 1 capsule (200 mg total) by mouth 2 (two) times daily as needed for cough., Disp: 20 capsule, Rfl: 0  Social History   Social History  . Marital status: Married    Spouse name: N/A  . Number of children: N/A  . Years of education: N/A   Social History Main Topics    . Smoking status: Former Smoker    Packs/day: 0.25    Years: 1.00    Types: Cigarettes    Quit date: 09/12/2011  . Smokeless tobacco: Never Used  . Alcohol use No  . Drug use: No  . Sexual activity: Yes   Other Topics Concern  . 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 BP 103/68   Pulse 73   Temp 98.9 F (37.2 C) (Oral)   Ht 5\' 1"  (1.549 m)   Wt 140 lb (63.5 kg)   LMP 12/13/2011   BMI 26.45 kg/m   Physical Examination:  General: Awake, alert, well nourished, nontoxic appearing, No acute distress HEENT: Normal    Neck: No masses palpated. No lymphadenopathy    Ears: Tympanic membranes intact, normal light reflex, no erythema, no bulging    Eyes: PERRLA, extraocular membranes intact, sclera white, no ocular discharge    Nose: nasal turbinates moist, clear nasal discharge  Throat: moist mucus membranes, mild o/p erythema, no tonsillar exudate.  Airway is patent Cardio: regular rate and rhythm, S1S2 heard, no murmurs appreciated Pulm: clear to auscultation bilaterally, no wheezes, rhonchi or rales; normal work of breathing on room air  Assessment/ Plan: 57 y.o. female   1. URI with cough and congestion Patient is afebrile with normal vital signs.  She is well-appearing on exam.  No evidence of bacterial infection on exam.  I suspect viral URI.  Home care instructions were reviewed with the patient and AVS was provided.  Tessalon Perles twice daily was prescribed for cough.Strict return precautions and reasons for emergent evaluation in the emergency department review with patient.  They voiced understanding and will follow-up as needed.  2. Sore throat Rapid strep was negative. - Rapid strep screen (not at [facility])   Orders Placed This Encounter  Procedures  . Rapid strep  screen (not at [facility])    Mandy Vargas, [place] [phone]

## 2017-07-13 NOTE — Patient Instructions (Addendum)
Your Strep test was negative.  It appears that you have a viral upper respiratory infection (cold).  Cold symptoms can last up to 2 weeks.    - Get plenty of rest and drink plenty of fluids. - Try to breathe moist air. Use a humidifier or take a steamy shower. - Consume warm fluids (soup or tea) to provide relief for a stuffy nose and to loosen phlegm. - For nasal stuffiness, try saline nasal spray or a Neti Pot. - For sore throat pain relief: suck on throat lozenges, hard candy or popsicles; gargle with warm salt water (1/4 tsp. salt per 8 oz. of water); and eat soft, bland foods. - Eat a well-balanced diet. If you cannot, ensure you are getting enough nutrients by taking a daily multivitamin. - Avoid dairy products, as they can thicken phlegm. - Avoid alcohol, as it impairs your body's immune system.  CONTACT YOUR DOCTOR IF YOU EXPERIENCE ANY OF THE FOLLOWING: - High fever - Ear pain - Sinus-type headache - Unusually severe cold symptoms - Cough that gets worse while other cold symptoms improve - Flare up of any chronic lung problem, such as asthma - Your symptoms persist longer than 2 weeks   **I value your feedback and appreciate you entrusting [place] with your care.  If you get a survey, I would appreciate your taking the time to let us know what your experience was like.

## 2017-07-30 ENCOUNTER — Ambulatory Visit: Payer: BC Managed Care – PPO | Admitting: Family Medicine

## 2017-07-30 VITALS — BP 103/58 | HR 74 | Temp 98.5°F | Ht 61.0 in | Wt 141.0 lb

## 2017-07-30 DIAGNOSIS — R110 Nausea: Secondary | ICD-10-CM

## 2017-07-30 DIAGNOSIS — J019 Acute sinusitis, unspecified: Secondary | ICD-10-CM | POA: Diagnosis not present

## 2017-07-30 DIAGNOSIS — B9689 Other specified bacterial agents as the cause of diseases classified elsewhere: Secondary | ICD-10-CM

## 2017-07-30 MED ORDER — ONDANSETRON 4 MG PO TBDP: 4.0000 mg | ORAL_TABLET | Freq: Three times a day (TID) | ORAL | 0 refills | Status: DC | PRN

## 2017-07-30 MED ORDER — AMOXICILLIN-POT CLAVULANATE 875-125 MG PO TABS: 1.0000 | ORAL_TABLET | Freq: Two times a day (BID) | ORAL | 0 refills | Status: DC

## 2017-07-30 MED ORDER — FLUCONAZOLE 150 MG PO TABS: 150.0000 mg | ORAL_TABLET | Freq: Once | ORAL | 0 refills | Status: DC

## 2017-07-30 MED ORDER — FLUCONAZOLE 150 MG PO TABS
150.0000 mg | ORAL_TABLET | Freq: Once | ORAL | 0 refills | Status: AC
Start: 2017-07-30 — End: 2017-07-30

## 2017-07-30 NOTE — Progress Notes (Signed)
Subjective: CC: URI symptoms PCP: Eustaquio Maize, MD AST:[id number] H Welborn is a 56 y.o. female presenting to clinic today for:  1. Cold symptoms  Patient was seen on November 2 for cough and sore throat.  She is prescribed Tessalon Perles and supportive care for viral URI was recommended.  Today she reports resolution of cough and sore throat.  Denies hemoptysis, congestion, rhinorrhea, SOB, dizziness, rash, abdominal pain, vomiting, diarrhea, fevers, chills, myalgia, sick contacts, recent travel.  She reports sinus pressure and headache.  Reports sinus drainage that is greenish in color.  She notes associated nausea without vomiting.  She reports that symptoms are actually slightly improved today.  She does report one episode of "reflux" that was relieved by Pepto-Bismol.  She notes improvement with nausea with Pepto-Bismol as well.  Denies belching, bloating, nighttime cough.   Allergies  Allergen Reactions  . Amitriptyline     Tried 10mg  in past, made her feel "out of it"   Past Medical History:  Diagnosis Date  . Anemia   . Breast cancer (HCC)    Left - DCIS  . Bruises easily   . Depression   . Nephrolithiasis    Status post lithotripsy  . Palpitations   . Wears glasses    Family History  Problem Relation Age of Onset  . Colon cancer Mother   . Breast cancer Sister   . Cancer Sister 49       Breast  . Heart attack Father 34       Deceased  . Cancer Maternal Aunt        Breast  . Colon cancer Paternal Grandmother        22'S  . Healthy Son   . Anesthesia problems Neg Hx     Current Outpatient Medications:  .  benzonatate (TESSALON) 200 MG capsule, Take 1 capsule (200 mg total) by mouth 2 (two) times daily as needed for cough., Disp: 20 capsule, Rfl: 0 .  buPROPion (WELLBUTRIN SR) 150 MG 12 hr tablet, Take 150 mg by mouth., Disp: , Rfl:  .  busPIRone (BUSPAR) 7.5 MG tablet, TAKE 1 TABLET (7.5 MG TOTAL) BY MOUTH 2 (TWO) TIMES DAILY., Disp: 60 tablet, Rfl: 2 .   metoprolol tartrate (LOPRESSOR) 25 MG tablet, TAKE 1 TABLET (25 MG TOTAL) BY MOUTH 2 (TWO) TIMES DAILY., Disp: 60 tablet, Rfl: 2 .  Multiple Vitamin (MULTIVITAMIN) tablet, Take 1 tablet by mouth daily., Disp: , Rfl:   Social Hx: non smoker.  ROS: Per HPI  Objective: Office vital signs reviewed. BP (!) 103/58   Pulse 74   Temp 98.5 F (36.9 C) (Oral)   Ht 5\' 1"  (1.549 m)   Wt 141 lb (64 kg)   LMP 12/13/2011   BMI 26.64 kg/m   Physical Examination:  General: Awake, alert, well nourished, well appearing, No acute distress HEENT: Normal    Neck: No masses palpated. No lymphadenopathy    Ears: Tympanic membranes intact, normal light reflex, no erythema, no bulging    Eyes: PERRLA, extraocular membranes intact, sclera white, no discharge    Nose: nasal turbinates moist, clear nasal discharge    Throat: moist mucus membranes, mild oropharyngeal erythema, no tonsillar exudate.  Airway is patent Cardio: regular rate and rhythm, S1S2 heard, no murmurs appreciated Pulm: clear to auscultation bilaterally, no wheezes, rhonchi or rales; normal work of breathing on room air GI: soft, non-tender, non-distended, bowel sounds present x4, no hepatomegaly, no splenomegaly, no masses  Assessment/ Plan: 56  y.o. female   1. Acute bacterial sinusitis Patient is well-appearing, afebrile with normal vital signs.  Given duration of symptoms, will cover for an acute bacterial sinusitis with Augmentin twice daily times 10 days.  Instructions for use were reviewed with the patient.  Diflucan was also prescribed x1 as needed yeast infection symptoms.  Strict return precautions and reasons for emergent evaluation in the emergency department review with patient.  They voiced understanding and will follow-up as needed.  2. Nausea Likely secondary to copious amount of drainage.  Zofran ODT 4 mg was prescribed to use 3 times daily as needed nausea or vomiting.  However, differentials discussed include untreated  acid reflux.  Her symptoms seem to improve with Pepto-Bismol and she could certainly have increased congestion/mucus production with GERD.  I did discuss that if symptoms do not improve with antibiotic that she should return and that we would consider initiation of PPI.  It was good understanding.  She will follow-up as needed.     [name], DO [place] [phone]

## 2017-07-30 NOTE — Patient Instructions (Signed)
It is possible that you have developed a bacterial sinusitis given the green discharge that you described.  I am getting cover you with Augmentin to take twice a day for the next 10 days.  I have also prescribed you Zofran if needed for nausea and vomiting and Diflucan if needed for yeast infection.  If your symptoms persist, particularly the nausea and morning congestion, we may consider initiating an acid reflux medication as this can sometimes present this way as well.    Sinusitis, Adult Sinusitis is soreness and inflammation of your sinuses. Sinuses are hollow spaces in the bones around your face. They are located:  Around your eyes.  In the middle of your forehead.  Behind your nose.  In your cheekbones.  Your sinuses and nasal passages are lined with a stringy fluid (mucus). Mucus normally drains out of your sinuses. When your nasal tissues get inflamed or swollen, the mucus can get trapped or blocked so air cannot flow through your sinuses. This lets bacteria, viruses, and funguses grow, and that leads to infection. Follow these instructions at home: Medicines  Take, use, or apply over-the-counter and prescription medicines only as told by your doctor. These may include nasal sprays.  If you were prescribed an antibiotic medicine, take it as told by your doctor. Do not stop taking the antibiotic even if you start to feel better. Hydrate and Humidify  Drink enough water to keep your pee (urine) clear or pale yellow.  Use a cool mist humidifier to keep the humidity level in your home above 50%.  Breathe in steam for 10-15 minutes, 3-4 times a day or as told by your doctor. You can do this in the bathroom while a hot shower is running.  Try not to spend time in cool or dry air. Rest  Rest as much as possible.  Sleep with your head raised (elevated).  Make sure to get enough sleep each night. General instructions  Put a warm, moist washcloth on your face 3-4 times a day or  as told by your doctor. This will help with discomfort.  Wash your hands often with soap and water. If there is no soap and water, use hand sanitizer.  Do not smoke. Avoid being around people who are smoking (secondhand smoke).  Keep all follow-up visits as told by your doctor. This is important. Contact a doctor if:  You have a fever.  Your symptoms get worse.  Your symptoms do not get better within 10 days. Get help right away if:  You have a very bad headache.  You cannot stop throwing up (vomiting).  You have pain or swelling around your face or eyes.  You have trouble seeing.  You feel confused.  Your neck is stiff.  You have trouble breathing. This information is not intended to replace advice given to you by your health care provider. Make sure you discuss any questions you have with your health care provider. Document Released: 02/14/2008 Document Revised: 04/23/2016 Document Reviewed: 06/23/2015 Elsevier Interactive Patient Education  [employer].

## 2017-08-02 ENCOUNTER — Other Ambulatory Visit: Payer: Self-pay | Admitting: Hematology and Oncology

## 2017-09-05 ENCOUNTER — Telehealth: Payer: Self-pay | Admitting: Pediatrics

## 2017-09-05 ENCOUNTER — Other Ambulatory Visit: Payer: Self-pay | Admitting: Family Medicine

## 2017-09-05 MED ORDER — FLUCONAZOLE 150 MG PO TABS: 150.0000 mg | ORAL_TABLET | Freq: Once | ORAL | 0 refills | Status: AC

## 2017-09-05 NOTE — Telephone Encounter (Signed)
Pt notified of RX 

## 2017-09-05 NOTE — Telephone Encounter (Signed)
Please review and advise.

## 2017-09-05 NOTE — Telephone Encounter (Signed)
Diflucan sent.  If she has persistent symptoms after repeat course, please have her f/u in office for exam.

## 2017-11-15 ENCOUNTER — Other Ambulatory Visit: Payer: Self-pay | Admitting: Pediatrics

## 2017-11-15 DIAGNOSIS — R002 Palpitations: Secondary | ICD-10-CM

## 2017-12-12 ENCOUNTER — Telehealth: Payer: Self-pay | Admitting: Pediatrics

## 2017-12-12 NOTE — Telephone Encounter (Signed)
Left message for patient to call.

## 2017-12-12 NOTE — Telephone Encounter (Signed)
Aware of provider's advise. 

## 2017-12-12 NOTE — Telephone Encounter (Signed)
Visit for work or a friend/family member at their home or in a health care setting? If in healthcare setting the facility should have protocol well posted, pts with active C diff are on contact precautions which means wearing gloves and gowns. She should wash her hands well with soap and water after contact with the pt. Alcohol hand cleanser doesn't kill the C. Diff spores.

## 2018-01-02 ENCOUNTER — Ambulatory Visit: Payer: BC Managed Care – PPO | Admitting: Family Medicine

## 2018-01-02 ENCOUNTER — Encounter: Payer: Self-pay | Admitting: Family Medicine

## 2018-01-02 VITALS — BP 94/54 | HR 72 | Temp 98.1°F | Ht 61.0 in | Wt 143.2 lb

## 2018-01-02 DIAGNOSIS — H1012 Acute atopic conjunctivitis, left eye: Secondary | ICD-10-CM | POA: Diagnosis not present

## 2018-01-02 MED ORDER — MOMETASONE FUROATE 0.1 % EX CREA: TOPICAL_CREAM | CUTANEOUS | 0 refills | Status: DC

## 2018-01-02 MED ORDER — OLOPATADINE HCL 0.1 % OP SOLN: 1.0000 [drp] | Freq: Two times a day (BID) | OPHTHALMIC | 0 refills | Status: DC

## 2018-01-08 NOTE — Progress Notes (Signed)
Chief Complaint  Patient presents with  . Eye Problem    pt here today c/o redness in the corner of her left eye and she isn't sure if it's pink eye or not    HPI  Patient presents today for redness itching and burning of the left eye.  The primary location is at the medial epicanthus.  She has no known exposure to conjunctivitis.  There have been no changes in her vision.  She has had no recent cold or upper respiratory symptoms.  No fever chills or sweats.  PMH: Smoking status noted ROS: Per HPI  Objective: BP (!) 94/54   Pulse 72   Temp 98.1 F (36.7 C) (Oral)   Ht 5\' 1"  (1.549 m)   Wt 143 lb 4 oz (65 kg)   LMP 12/13/2011   BMI 27.07 kg/m  Gen: NAD, alert, cooperative with exam HEENT: NCAT, EOMI, PERRL.  There is mild injection of the conjunctiva primarily on the left much milder on the right.  There is no mattering of any significance noted.  No foreign body could be identified   Assessment and plan:  1. Allergic conjunctivitis of left eye     Meds ordered this encounter  Medications  . olopatadine (PATANOL) 0.1 % ophthalmic solution    Sig: Place 1 drop into both eyes 2 (two) times daily.    Dispense:  5 mL    Refill:  0  . mometasone (ELOCON) 0.1 % cream    Sig: Apply topically daily to forehead    Dispense:  45 g    Refill:  0    No orders of the defined types were placed in this encounter.   Follow up as needed.  Claretta Fraise, MD

## 2018-01-09 ENCOUNTER — Encounter: Payer: Self-pay | Admitting: Family Medicine

## 2018-01-28 ENCOUNTER — Telehealth: Payer: Self-pay | Admitting: Pediatrics

## 2018-01-28 NOTE — Telephone Encounter (Signed)
Pt given appt with Dr Evette Doffing 5/30 at 4:00.

## 2018-02-07 ENCOUNTER — Ambulatory Visit: Payer: BC Managed Care – PPO | Admitting: Pediatrics

## 2018-02-07 ENCOUNTER — Encounter: Payer: Self-pay | Admitting: Pediatrics

## 2018-02-07 VITALS — BP 97/64 | HR 71 | Temp 99.3°F | Ht 61.0 in | Wt 141.0 lb

## 2018-02-07 DIAGNOSIS — R911 Solitary pulmonary nodule: Secondary | ICD-10-CM | POA: Diagnosis not present

## 2018-02-07 DIAGNOSIS — F339 Major depressive disorder, recurrent, unspecified: Secondary | ICD-10-CM

## 2018-02-07 DIAGNOSIS — R413 Other amnesia: Secondary | ICD-10-CM | POA: Diagnosis not present

## 2018-02-07 NOTE — Patient Instructions (Signed)
[email or web address] To connect with the [facility] [phone]

## 2018-02-07 NOTE — Progress Notes (Signed)
Subjective:   Patient ID: Mandy Vargas, female    DOB: 1960-12-02, 57 y.o.   MRN: [id number] CC: Memory Loss  HPI: Mandy Vargas is a 57 y.o. female   Started zoloft about 2 months ago by OB/GYN.  To 100 mg.  She does not think is been helping very much.  She has an appointment in a couple weeks with psychiatry.  She has not been seen by them before.  She is been tried on multiple medicines for depression including Lexapro, Wellbutrin, venlafaxine.  She remains under a fair amount of stress with her mom having Alzheimer's.  She asked about support groups in the area.  She continues to be bothered by memory problems.  She brought in a list of what is been bothering her, "short-term memory, trouble communicating, confusion, not feeling very social, cloudiness, mental disarray, moodiness, losing things, and ability to understand, and ability to perform tasks, loss of interest, never feeling very good, feeling off balance, problems with coping."  She has not had any falls.  She is been feeling like her memory is less than what it used to be for months.   Lung nodule on abdominal CT scan for colitis seen incidentally April 2017.  Overdue for repeat.  Depression screen [facility] 2/9 02/07/2018 07/30/2017 07/13/2017 02/19/2017 01/11/2017  Decreased Interest 3 2 2 3 1   Down, Depressed, Hopeless 3 2 1 3 1   PHQ - 2 Score 6 4 3 6 2   Altered sleeping 2 2 1 1 1   Tired, decreased energy 3 2 2 2 1   Change in appetite 0 0 0 0 0  Feeling bad or failure about yourself  0 0 0 0 0  Trouble concentrating 2 0 1 2 0  Moving slowly or fidgety/restless 2 0 0 3 0  Suicidal thoughts 0 0 0 0 0  PHQ-9 Score 15 8 7 14 4   Difficult doing work/chores Somewhat difficult - Somewhat difficult - Somewhat difficult     Relevant past medical, surgical, family and social history reviewed. Allergies and medications reviewed and updated. Social History   Tobacco Use  Smoking Status Former Smoker  . Packs/day: 0.25  . Years:  1.00  . Pack years: 0.25  . Types: Cigarettes  . Last attempt to quit: 09/12/2011  . Years since quitting: 6.4  Smokeless Tobacco Never Used   ROS: Per HPI   Objective:    BP 97/64   Pulse 71   Temp 99.3 F (37.4 C) (Oral)   Ht 5\' 1"  (1.549 m)   Wt 141 lb (64 kg)   LMP 12/13/2011   BMI 26.64 kg/m   Wt Readings from Last 3 Encounters:  02/07/18 141 lb (64 kg)  01/02/18 143 lb 4 oz (65 kg)  07/30/17 141 lb (64 kg)    Gen: NAD, alert, cooperative with exam, NCAT EYES: EOMI, no conjunctival injection, or no icterus ENT:  OP without erythema LYMPH: no cervical LAD CV: NRRR, normal S1/S2, no murmur, distal pulses 2+ b/l Resp: CTABL, no wheezes, normal WOB Abd: +BS, soft, NTND. no guarding or organomegaly Ext: No edema, warm Neuro: Alert and oriented, strength equal b/l UE and LE, coordination grossly normal MSK: normal muscle bulk  Assessment & Plan:  Mandy Vargas was seen today for memory loss.  Diagnoses and all orders for this visit:  Memory problem Ongoing stress and symptomatic depression may be contributing.  Will get neuropsychiatric testing -     Ambulatory referral to Neurology  Depression, recurrent ([place])  Ongoing symptoms.  Gave information for Alzheimer's support network in this area.  Continue sertraline, Wellbutrin.  Has an appointment upcoming with psychiatry.  Lung nodule -     CT Chest Wo Contrast; Future  Solitary pulmonary nodule -     CT Chest Wo Contrast; Future   Follow up plan: Return in about 2 months (around 04/09/2018). Assunta Found, MD [place]

## 2018-02-08 ENCOUNTER — Encounter: Payer: Self-pay | Admitting: Psychology

## 2018-02-13 ENCOUNTER — Ambulatory Visit: Payer: BC Managed Care – PPO | Admitting: Pediatrics

## 2018-02-13 ENCOUNTER — Ambulatory Visit (HOSPITAL_COMMUNITY)
Admission: RE | Admit: 2018-02-13 | Discharge: 2018-02-13 | Disposition: A | Discharge status: Home / Self Care | Payer: BC Managed Care – PPO | Source: Ambulatory Visit | Attending: Pediatrics | Admitting: Pediatrics

## 2018-02-13 DIAGNOSIS — R911 Solitary pulmonary nodule: Secondary | ICD-10-CM

## 2018-02-18 ENCOUNTER — Other Ambulatory Visit: Payer: BC Managed Care – PPO

## 2018-02-18 ENCOUNTER — Telehealth: Payer: Self-pay | Admitting: Pediatrics

## 2018-02-18 DIAGNOSIS — R5383 Other fatigue: Secondary | ICD-10-CM

## 2018-02-18 NOTE — Telephone Encounter (Signed)
Patient was seen 5/30 for depression and memory loss.  Requesting to have labs done to see if there is something else going on besides depression to make her feel fatigue. Also wanting her b 12 checked to see if she might need injections. Please advise and place labs if approved.  Would like [place] to leave her a detailed message if she doesn't answer.

## 2018-02-18 NOTE — Telephone Encounter (Signed)
Labs in

## 2018-02-18 NOTE — Telephone Encounter (Signed)
Left detailed message per patient request

## 2018-02-19 LAB — CBC WITH DIFFERENTIAL/PLATELET
Basophils Absolute: 0 x10E3/uL (ref 0.0–0.2)
Basos: 0 %
EOS (ABSOLUTE): 0.1 x10E3/uL (ref 0.0–0.4)
Eos: 3 %
Hematocrit: 36.6 % (ref 34.0–46.6)
Hemoglobin: 12 g/dL (ref 11.1–15.9)
Immature Grans (Abs): 0 x10E3/uL (ref 0.0–0.1)
Immature Granulocytes: 0 %
Lymphocytes Absolute: 1.7 x10E3/uL (ref 0.7–3.1)
Lymphs: 33 %
MCH: 29.1 pg (ref 26.6–33.0)
MCHC: 32.8 g/dL (ref 31.5–35.7)
MCV: 89 fL (ref 79–97)
Monocytes Absolute: 0.4 x10E3/uL (ref 0.1–0.9)
Monocytes: 7 %
Neutrophils Absolute: 2.9 x10E3/uL (ref 1.4–7.0)
Neutrophils: 57 %
Platelets: 182 x10E3/uL (ref 150–450)
RBC: 4.13 x10E6/uL (ref 3.77–5.28)
RDW: 13.5 % (ref 12.3–15.4)
WBC: 5.1 x10E3/uL (ref 3.4–10.8)

## 2018-02-19 LAB — CMP14+EGFR
ALT: 34 IU/L — ABNORMAL HIGH (ref 0–32)
AST: 29 IU/L (ref 0–40)
Albumin/Globulin Ratio: 1.6 (ref 1.2–2.2)
Albumin: 4 g/dL (ref 3.5–5.5)
Alkaline Phosphatase: 80 IU/L (ref 39–117)
BUN/Creatinine Ratio: 19 (ref 9–23)
BUN: 18 mg/dL (ref 6–24)
Bilirubin Total: <0.2 mg/dL (ref 0.0–1.2)
CO2: 26 mmol/L (ref 20–29)
Calcium: 9.2 mg/dL (ref 8.7–10.2)
Chloride: 101 mmol/L (ref 96–106)
Creatinine, Ser: 0.96 mg/dL (ref 0.57–1.00)
GFR calc Af Amer: 75 mL/min/1.73
GFR calc non Af Amer: 65 mL/min/1.73
Globulin, Total: 2.5 g/dL (ref 1.5–4.5)
Glucose: 112 mg/dL — ABNORMAL HIGH (ref 65–99)
Potassium: 4.4 mmol/L (ref 3.5–5.2)
Sodium: 140 mmol/L (ref 134–144)
Total Protein: 6.5 g/dL (ref 6.0–8.5)

## 2018-02-19 LAB — TSH: TSH: 2.22 u[IU]/mL (ref 0.450–4.500)

## 2018-02-19 LAB — VITAMIN B12: Vitamin B-12: 331 pg/mL (ref 232–1245)

## 2018-02-19 LAB — FOLATE: Folate: >20 ng/mL

## 2018-02-21 ENCOUNTER — Telehealth: Payer: Self-pay | Admitting: Pediatrics

## 2018-02-21 NOTE — Telephone Encounter (Signed)
Hasn't been reviewed

## 2018-03-07 ENCOUNTER — Other Ambulatory Visit: Payer: Self-pay | Admitting: Pediatrics

## 2018-03-07 DIAGNOSIS — R002 Palpitations: Secondary | ICD-10-CM

## 2018-03-28 ENCOUNTER — Ambulatory Visit: Payer: BC Managed Care – PPO | Admitting: Pediatrics

## 2018-03-28 ENCOUNTER — Encounter: Payer: Self-pay | Admitting: Pediatrics

## 2018-03-28 VITALS — BP 113/69 | HR 67 | Temp 99.1°F | Ht 61.0 in | Wt 145.8 lb

## 2018-03-28 DIAGNOSIS — F39 Unspecified mood [affective] disorder: Secondary | ICD-10-CM | POA: Diagnosis not present

## 2018-03-28 DIAGNOSIS — E785 Hyperlipidemia, unspecified: Secondary | ICD-10-CM | POA: Diagnosis not present

## 2018-03-28 NOTE — Patient Instructions (Signed)
[email or web address]  Your provider wants you to schedule an appointment with a Psychologist/Psychiatrist. The following list of offices requires the patient to call and make their own appointment, as there is information they need that only you can provide. Please feel free to choose form the following providers:  [facility] in [place]  [place]  [phone] [place], [place]  (Scheduled through [place]) Must call and do an interview for appointment. Sees Children / Accepts Medicaid  [facility] [place]  [address], [place]    [place], [employer]  [phone] [place], [place] for Autism but does not treat it Sees Children / Accepts Medicaid  [facility]    [phone] [address] [place], [place] Medication management, substance abuse, bipolar, grief, family, marriage, OCD, anxiety, PTSD Sees children / Accepts Medicaid  [facility]    [phone] [address], [place], [place] children / Accepts [facility]  [facility]  [phone] [address] [place], [place]   Dr Lorenza Evangelist     [phone] [address] [place], [place]  Sees ADD & ADHD for treatment Accepts Medicaid  [facility]  [phone] Arlean Hopping, [place] for Autism Accepts [facility]  [facility]  [phone] [place], [place]  Does Adult ADD evaluations Does not accept Medicaid  [facility]   [phone] [place], [place] children as young as 64 years old Accepts [facility]     [phone]    [place], [place] [address] Sees children Accepts Medicaid

## 2018-03-28 NOTE — Progress Notes (Signed)
  Subjective:   Patient ID: Mandy Vargas, female    DOB: [birth year], 59 y.o.   MRN: [id number] CC: Medication side effects  HPI: Mandy Vargas is a 57 y.o. female   Depression: Has been on Wellbutrin and sertraline for several years.  Seeing Dr. Pauline Good in [place] for depression. Started latuda about a month ago, also started weaning off of Wellbutrin and sertraline.  She says she was told she might have bipolar disorder.  Feeling tired, sleepy, out of it. Mood has been "bad". Sleeping ok if off of caffeine.  No thoughts of self-harm or not wanting to be here anymore.  She would like to change to different psychiatrist.  She has not been in counseling, she is open to counseling.  Two years ago her mother got sick, she is now living in a nursing home.  Ongoing stress.  Relevant past medical, surgical, family and social history reviewed. Allergies and medications reviewed and updated. Social History   Tobacco Use  Smoking Status Former Smoker  . Packs/day: 0.25  . Years: 1.00  . Pack years: 0.25  . Types: Cigarettes  . Last attempt to quit: 09/12/2011  . Years since quitting: 6.5  Smokeless Tobacco Never Used   ROS: Per HPI   Objective:    BP 113/69   Pulse 67   Temp 99.1 F (37.3 C) (Oral)   Ht '5\' 1"'$  (1.549 m)   Wt 145 lb 12.8 oz (66.1 kg)   LMP 12/13/2011   BMI 27.55 kg/m   Wt Readings from Last 3 Encounters:  03/28/18 145 lb 12.8 oz (66.1 kg)  02/07/18 141 lb (64 kg)  01/02/18 143 lb 4 oz (65 kg)    Gen: NAD, alert, cooperative with exam, NCAT EYES: EOMI, no conjunctival injection, or no icterus CV: NRRR, normal S1/S2, no murmur, distal pulses 2+ b/l Resp: CTABL, no wheezes, normal WOB Abd: +BS, soft, NTND. no guarding or organomegaly Ext: No edema, warm Neuro: Alert and oriented MSK: normal muscle bulk  Assessment & Plan:  Mandy Vargas was seen today for medication side effects.  Diagnoses and all orders for this visit:  Mood disorder [facility]) Encouraged  continuing current medicines and so she is able to get back in touch with her psychiatrist. -     Ambulatory referral to Psychiatry  Hyperlipidemia, unspecified hyperlipidemia type Pt worried about waking side effects from [place].  Will return for fasting lipid panel. -     BMP8+EGFR; Future -     Lipid panel; Future  I spent 25 minutes with the patient with over 50% of the encounter time dedicated to counseling on the above problems.  Follow up plan: No follow-ups on file. Assunta Found, MD [place]

## 2018-03-29 ENCOUNTER — Other Ambulatory Visit: Payer: BC Managed Care – PPO

## 2018-03-29 ENCOUNTER — Telehealth: Payer: Self-pay | Admitting: Licensed Clinical Social Worker

## 2018-03-29 DIAGNOSIS — F418 Other specified anxiety disorders: Secondary | ICD-10-CM

## 2018-03-29 DIAGNOSIS — F419 Anxiety disorder, unspecified: Secondary | ICD-10-CM

## 2018-03-29 DIAGNOSIS — F339 Major depressive disorder, recurrent, unspecified: Secondary | ICD-10-CM

## 2018-03-29 DIAGNOSIS — E785 Hyperlipidemia, unspecified: Secondary | ICD-10-CM

## 2018-03-29 DIAGNOSIS — F39 Unspecified mood [affective] disorder: Secondary | ICD-10-CM

## 2018-03-29 LAB — LIPID PANEL
CHOL/HDL RATIO: 5.3 ratio — AB (ref 0.0–4.4)
Cholesterol, Total: 299 mg/dL — ABNORMAL HIGH (ref 100–199)
HDL: 56 mg/dL (ref >39)
LDL Calculated: 219 mg/dL — ABNORMAL HIGH (ref 0–99)
Triglycerides: 121 mg/dL (ref 0–149)
VLDL Cholesterol Cal: 24 mg/dL (ref 5–40)

## 2018-03-29 LAB — BMP8+EGFR
BUN/Creatinine Ratio: 31 — ABNORMAL HIGH (ref 9–23)
BUN: 24 mg/dL (ref 6–24)
CALCIUM: 9.1 mg/dL (ref 8.7–10.2)
CHLORIDE: 103 mmol/L (ref 96–106)
CO2: 24 mmol/L (ref 20–29)
Creatinine, Ser: 0.78 mg/dL (ref 0.57–1.00)
GFR calc non Af Amer: 83 mL/min/{1.73_m2} (ref >59)
GFR, EST AFRICAN AMERICAN: 96 mL/min/{1.73_m2} (ref >59)
Glucose: 94 mg/dL (ref 65–99)
POTASSIUM: 4.5 mmol/L (ref 3.5–5.2)
Sodium: 140 mmol/L (ref 134–144)

## 2018-03-29 NOTE — Telephone Encounter (Signed)
Followed call to client this morning. She answered the phone but stated that it wasn't a good time to talk. She stated that she was in the beauty shop and would call back at a later time. Counselor provided client with contact number to return call.  

## 2018-04-11 ENCOUNTER — Telehealth: Payer: Self-pay

## 2018-04-11 NOTE — Telephone Encounter (Signed)
Patient reports that she was driving and not able to talk.  Patient reports that she will call me back.

## 2018-05-01 ENCOUNTER — Telehealth: Payer: Self-pay

## 2018-05-01 NOTE — Telephone Encounter (Signed)
3rd attempt - Left Msg

## 2018-05-07 ENCOUNTER — Other Ambulatory Visit: Payer: Self-pay | Admitting: Obstetrics and Gynecology

## 2018-05-07 DIAGNOSIS — Z853 Personal history of malignant neoplasm of breast: Secondary | ICD-10-CM

## 2018-05-21 ENCOUNTER — Telehealth: Payer: Self-pay

## 2018-05-21 NOTE — Telephone Encounter (Signed)
[facility] - Several attempts have been made to contact patient without success. Patient will be placed on the inactive list.  If services are needed again.  Please contact [facility] at [phone].    Information will be routed to the PCP and Dr. Hisada  

## 2018-06-03 ENCOUNTER — Telehealth: Payer: Self-pay | Admitting: Pediatrics

## 2018-06-03 NOTE — Telephone Encounter (Signed)
Patient is wondering if she could start B12 injections.  She had level checked on 02/18/18 and it was found to be 331.  You recommended she start B12 daily, which she has been doing.  She reports today she feels that her energy level has decreased instead of improved.  Please advise.

## 2018-06-03 NOTE — Telephone Encounter (Signed)
PT wants to know if we can give her a vitamin d shot, the liquid doesn't help since she has been feeling so tired.

## 2018-06-04 ENCOUNTER — Other Ambulatory Visit: Payer: Self-pay | Admitting: *Deleted

## 2018-06-04 DIAGNOSIS — E538 Deficiency of other specified B group vitamins: Secondary | ICD-10-CM

## 2018-06-04 MED ORDER — CYANOCOBALAMIN 1000 MCG/ML IJ SOLN
1000.0000 ug | INTRAMUSCULAR | Status: AC
  Administered 2018-06-06 – 2018-07-01 (×4): 1000 ug via INTRAMUSCULAR

## 2018-06-04 NOTE — Telephone Encounter (Signed)
Patient aware, appt made, ordered placed

## 2018-06-04 NOTE — Telephone Encounter (Signed)
Yes, OK to try once q4 week B12 shots.

## 2018-06-06 ENCOUNTER — Ambulatory Visit (INDEPENDENT_AMBULATORY_CARE_PROVIDER_SITE_OTHER): Payer: BC Managed Care – PPO | Admitting: *Deleted

## 2018-06-06 DIAGNOSIS — E538 Deficiency of other specified B group vitamins: Secondary | ICD-10-CM | POA: Diagnosis not present

## 2018-06-06 NOTE — Progress Notes (Signed)
Pt given Cyanocobalamin inj Tolerated well 

## 2018-06-14 ENCOUNTER — Ambulatory Visit (INDEPENDENT_AMBULATORY_CARE_PROVIDER_SITE_OTHER): Payer: BC Managed Care – PPO | Admitting: *Deleted

## 2018-06-14 DIAGNOSIS — E538 Deficiency of other specified B group vitamins: Secondary | ICD-10-CM | POA: Diagnosis not present

## 2018-06-17 ENCOUNTER — Ambulatory Visit
Admission: RE | Admit: 2018-06-17 | Discharge: 2018-06-17 | Disposition: A | Discharge status: Home / Self Care | Payer: BC Managed Care – PPO | Source: Ambulatory Visit | Attending: Obstetrics and Gynecology | Admitting: Obstetrics and Gynecology

## 2018-06-17 DIAGNOSIS — Z853 Personal history of malignant neoplasm of breast: Secondary | ICD-10-CM

## 2018-06-20 ENCOUNTER — Ambulatory Visit: Payer: BC Managed Care – PPO

## 2018-06-24 ENCOUNTER — Ambulatory Visit (INDEPENDENT_AMBULATORY_CARE_PROVIDER_SITE_OTHER): Payer: BC Managed Care – PPO | Admitting: *Deleted

## 2018-06-24 DIAGNOSIS — E538 Deficiency of other specified B group vitamins: Secondary | ICD-10-CM | POA: Diagnosis not present

## 2018-06-24 NOTE — Progress Notes (Signed)
Pt given Cyanocobalamin inj Tolerated well 

## 2018-07-01 ENCOUNTER — Ambulatory Visit (INDEPENDENT_AMBULATORY_CARE_PROVIDER_SITE_OTHER): Payer: BC Managed Care – PPO | Admitting: *Deleted

## 2018-07-01 DIAGNOSIS — E538 Deficiency of other specified B group vitamins: Secondary | ICD-10-CM

## 2018-07-01 NOTE — Progress Notes (Signed)
Pt given Cyanocobalamin inj Tolerated well 

## 2018-07-02 ENCOUNTER — Telehealth: Payer: Self-pay | Admitting: Pediatrics

## 2018-07-02 NOTE — Telephone Encounter (Signed)
lmtcb

## 2018-07-02 NOTE — Telephone Encounter (Signed)
lmtcb- need more information  

## 2018-07-04 NOTE — Telephone Encounter (Signed)
LMTCB

## 2018-07-09 ENCOUNTER — Other Ambulatory Visit: Payer: Self-pay

## 2018-07-09 ENCOUNTER — Telehealth: Payer: Self-pay | Admitting: Pediatrics

## 2018-07-09 DIAGNOSIS — R413 Other amnesia: Secondary | ICD-10-CM

## 2018-07-09 NOTE — Progress Notes (Signed)
Patient states she spoke to [facility] and states that Dr.Bailar was the only provider that could to the test she was needing done. Requested new referral - referral placed.

## 2018-07-09 NOTE — Telephone Encounter (Signed)
Refer to phone note from 10/29

## 2018-07-09 NOTE — Telephone Encounter (Signed)
Patient states that [facility] canceled her apt in Nov due to Dr. Si Raider leaving the practice. Patient aware to call their office to see if they can get her in to see someone else and if not to let us know.

## 2018-07-20 ENCOUNTER — Ambulatory Visit: Payer: BC Managed Care – PPO | Admitting: Family Medicine

## 2018-07-20 VITALS — BP 107/65 | HR 87 | Temp 100.8°F | Ht 61.0 in | Wt 139.0 lb

## 2018-07-20 DIAGNOSIS — R6889 Other general symptoms and signs: Secondary | ICD-10-CM | POA: Diagnosis not present

## 2018-07-20 NOTE — Progress Notes (Signed)
BP 107/65   Pulse 87   Temp (!) 100.8 F (38.2 C)   Ht 5\' 1"  (1.549 m)   Wt 139 lb (63 kg)   LMP 12/13/2011   BMI 26.26 kg/m    Subjective:    Patient ID: Mandy Vargas, female    DOB: 08-Aug-1961, 57 y.o.   MRN: [id number]  HPI: Mandy Vargas is a 57 y.o. female presenting on 07/20/2018 for No chief complaint on file.   HPI Sinus pressure and cough and congestion and nasal drainage that has been going on for the past 2 days.  She is also had some sweats and she currently has a low-grade fever here in the office of 100.8.  She did not feel like she had that much of the fever today may be last night she might of had some.  She has been taking Tylenol and using Mucinex which is helped a little.  She denies any shortness of breath or wheezing.  Relevant past medical, surgical, family and social history reviewed and updated as indicated. Interim medical history since our last visit reviewed. Allergies and medications reviewed and updated.  Review of Systems  Constitutional: Negative for chills and fever.  HENT: Positive for congestion, postnasal drip, rhinorrhea, sinus pressure, sneezing and sore throat. Negative for ear discharge and ear pain.   Eyes: Negative for pain, redness and visual disturbance.  Respiratory: Positive for cough. Negative for chest tightness and shortness of breath.   Cardiovascular: Negative for chest pain and leg swelling.  Genitourinary: Negative for difficulty urinating and dysuria.  Musculoskeletal: Negative for back pain and gait problem.  Skin: Negative for rash.  Neurological: Negative for light-headedness and headaches.  Psychiatric/Behavioral: Negative for agitation and behavioral problems.  All other systems reviewed and are negative.   Per HPI unless specifically indicated above   Allergies as of 07/20/2018      Reactions   Amitriptyline    Tried 10mg  in past, made her feel "out of it"      Medication List        Accurate as of  07/20/18  8:54 AM. Always use your most recent med list.          buPROPion 300 MG 24 hr tablet Commonly known as:  WELLBUTRIN XL Take 300 mg by mouth daily.   diazepam 2 MG tablet Commonly known as:  VALIUM Take 2 mg by mouth at bedtime.   LATUDA 20 MG Tabs tablet Generic drug:  lurasidone Take by mouth.   metoprolol tartrate 25 MG tablet Commonly known as:  LOPRESSOR TAKE 1 TABLET BY MOUTH TWO TIMES DAILY   multivitamin tablet Take 1 tablet by mouth daily.   sertraline 50 MG tablet Commonly known as:  ZOLOFT Take 100 mg by mouth daily.          Objective:    BP 107/65   Pulse 87   Temp (!) 100.8 F (38.2 C)   Ht 5\' 1"  (1.549 m)   Wt 139 lb (63 kg)   LMP 12/13/2011   BMI 26.26 kg/m   Wt Readings from Last 3 Encounters:  07/20/18 139 lb (63 kg)  03/28/18 145 lb 12.8 oz (66.1 kg)  02/07/18 141 lb (64 kg)    Physical Exam  Constitutional: She is oriented to person, place, and time. She appears well-developed and well-nourished. No distress.  HENT:  Right Ear: Tympanic membrane, external ear and ear canal normal.  Left Ear: Tympanic membrane, external ear and ear  canal normal.  Nose: Mucosal edema and rhinorrhea present. No epistaxis. Right sinus exhibits no maxillary sinus tenderness and no frontal sinus tenderness. Left sinus exhibits no maxillary sinus tenderness and no frontal sinus tenderness.  Mouth/Throat: Uvula is midline and mucous membranes are normal. Posterior oropharyngeal edema and posterior oropharyngeal erythema present. No oropharyngeal exudate or tonsillar abscesses.  Eyes: Conjunctivae are normal.  Cardiovascular: Normal rate, regular rhythm, normal heart sounds and intact distal pulses.  No murmur heard. Pulmonary/Chest: Effort normal and breath sounds normal. No respiratory distress. She has no wheezes.  Musculoskeletal: Normal range of motion. She exhibits no edema.  Neurological: She is alert and oriented to person, place, and time.  Coordination normal.  Skin: Skin is warm and dry. No rash noted. She is not diaphoretic.  Psychiatric: She has a normal mood and affect. Her behavior is normal.  Vitals reviewed.       Assessment & Plan:   Problem List Items Addressed This Visit    None    Visit Diagnoses    Flu-like symptoms    -  Primary   Relevant Orders   Veritor Flu A/B Waived      Recommended Flonase Mucinex and nasal saline and conservative management, flu negative. Follow up plan: Return if symptoms worsen or fail to improve.  Counseling provided for all of the vaccine components Orders Placed This Encounter  Procedures  . Veritor Flu A/B [place], MD [facility] 07/20/2018, 8:54 AM

## 2018-07-22 LAB — VERITOR FLU A/B WAIVED
INFLUENZA A: NEGATIVE
INFLUENZA B: NEGATIVE

## 2018-07-30 ENCOUNTER — Encounter: Payer: BC Managed Care – PPO | Admitting: Psychology

## 2018-08-02 ENCOUNTER — Ambulatory Visit: Payer: BC Managed Care – PPO

## 2018-08-02 ENCOUNTER — Encounter: Payer: Self-pay | Admitting: Family Medicine

## 2018-08-02 ENCOUNTER — Ambulatory Visit: Payer: BC Managed Care – PPO | Admitting: Family Medicine

## 2018-08-02 VITALS — BP 111/67 | HR 61 | Temp 98.1°F | Ht 61.0 in | Wt 139.0 lb

## 2018-08-02 DIAGNOSIS — J029 Acute pharyngitis, unspecified: Secondary | ICD-10-CM

## 2018-08-02 DIAGNOSIS — E538 Deficiency of other specified B group vitamins: Secondary | ICD-10-CM | POA: Diagnosis not present

## 2018-08-02 LAB — RAPID STREP SCREEN (MED CTR MEBANE ONLY): Strep Gp A Ag, IA W/Reflex: NEGATIVE

## 2018-08-02 LAB — CULTURE, GROUP A STREP

## 2018-08-02 MED ORDER — CYANOCOBALAMIN 1000 MCG/ML IJ SOLN
1000.0000 ug | INTRAMUSCULAR | Status: AC
Start: 2018-08-02 — End: 2019-07-28
  Administered 2018-08-02 – 2019-07-03 (×6): 1000 ug via INTRAMUSCULAR

## 2018-08-02 MED ORDER — MAGIC MOUTHWASH W/LIDOCAINE: ORAL | 0 refills | Status: DC

## 2018-08-02 NOTE — Patient Instructions (Signed)
Your strep was negative.  This is been sent for culture to ensure a true negative result.  Contacted next week with the result.  I have given you a prescription for Magic mouthwash which contains lidocaine.  You may gargle this up to 4 times daily if needed for sore throat.  It has medication which treats thrush as well.   Sore Throat When you have a sore throat, your throat may:  Hurt.  Burn.  Feel irritated.  Feel scratchy.  Many things can cause a sore throat, including:  An infection.  Allergies.  Dryness in the air.  Smoke or pollution.  Gastroesophageal reflux disease (GERD).  A tumor.  A sore throat can be the first sign of another sickness. It can happen with other problems, like coughing or a fever. Most sore throats go away without treatment. Follow these instructions at home:  Take over-the-counter medicines only as told by your doctor.  Drink enough fluids to keep your pee (urine) clear or pale yellow.  Rest when you feel you need to.  To help with pain, try: ? Sipping warm liquids, such as broth, herbal tea, or warm water. ? Eating or drinking cold or frozen liquids, such as frozen ice pops. ? Gargling with a salt-water mixture 3-4 times a day or as needed. To make a salt-water mixture, add -1 tsp of salt in 1 cup of warm water. Mix it until you cannot see the salt anymore. ? Sucking on hard candy or throat lozenges. ? Putting a cool-mist humidifier in your bedroom at night. ? Sitting in the bathroom with the door closed for 5-10 minutes while you run hot water in the shower.  Do not use any tobacco products, such as cigarettes, chewing tobacco, and e-cigarettes. If you need help quitting, ask your doctor. Contact a doctor if:  You have a fever for more than 2-3 days.  You keep having symptoms for more than 2-3 days.  Your throat does not get better in 7 days.  You have a fever and your symptoms suddenly get worse. Get help right away if:  You  have trouble breathing.  You cannot swallow fluids, soft foods, or your saliva.  You have swelling in your throat or neck that gets worse.  You keep feeling like you are going to throw up (vomit).  You keep throwing up. This information is not intended to replace advice given to you by your health care provider. Make sure you discuss any questions you have with your health care provider. Document Released: 06/06/2008 Document Revised: 04/23/2016 Document Reviewed: 06/18/2015 Elsevier Interactive Patient Education  [employer].

## 2018-08-02 NOTE — Progress Notes (Signed)
Subjective: CC: Sore throat PCP: Eustaquio Maize, MD Mandy Vargas is a 57 y.o. female presenting to clinic today for:  1.  Sore throat Patient reports a couple day history of return of sore throat.  She notes that she was seen 2 weeks ago for flulike symptoms and had similar but it resolved.  She does report some drainage down the back of her throat.  She has not yet started the Flonase that was recommended at last visit.  She reports a mild productive cough but denies any hemoptysis.  No fevers since start of illness 2 weeks ago.  No nausea, vomiting.  She has occasional headaches but these are chronic and not increased in frequency.  She has been gargling salt water and using Mucinex.  She was concern for possible thrush but states that she used baking soda toothpaste and it seemed to resolve.  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   ROS: Per HPI  Allergies  Allergen Reactions  . Amitriptyline     Tried 10mg  in past, made her feel "out of it"   Past Medical History:  Diagnosis Date  . Anemia   . 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 Bruises easily   . Depression   . Nephrolithiasis    Status post lithotripsy  . Palpitations   . Wears glasses     Current Outpatient Medications:  .  buPROPion (WELLBUTRIN XL) 300 MG 24 hr tablet, Take 300 mg by mouth daily., Disp: , Rfl:  .  diazepam (VALIUM) 2 MG tablet, Take 2 mg by mouth at bedtime., Disp: , Rfl: 0 .  metoprolol tartrate (LOPRESSOR) 25 MG tablet, TAKE 1 TABLET BY MOUTH TWO TIMES DAILY, Disp: 180 tablet, Rfl: 1 .  Multiple Vitamin (MULTIVITAMIN) tablet, Take 1 tablet by mouth daily., Disp: , Rfl:  .  sertraline (ZOLOFT) 50 MG tablet, Take 150 mg by mouth daily. , Disp: , Rfl: 0  Current Facility-Administered Medications:  .  cyanocobalamin ((VITAMIN B-12)) injection 1,000 mcg, 1,000 mcg, Intramuscular, Q30 days, Eustaquio Maize, MD Social  History   Socioeconomic History  . Marital status: Married    Spouse name: Not on file  . Number of children: Not on file  . Years of education: Not on file  . Highest education level: Not on file  Occupational History  . Not on file  Social Needs  . Financial resource strain: Not on file  . Food insecurity:    Worry: Not on file    Inability: Not on file  . Transportation needs:    Medical: Not on file    Non-medical: Not on file  Tobacco Use  . Smoking status: Former Smoker    Packs/day: 0.25    Years: 1.00    Pack years: 0.25    Types: Cigarettes    Last attempt to quit: 09/12/2011    Years since quitting: 6.8  . Smokeless tobacco: Never Used  Substance and Sexual Activity  . Alcohol use: No    Alcohol/week: 0.0 standard drinks  . Drug use: No  . Sexual activity: Yes  Lifestyle  . Physical activity:    Days per week: Not on file    Minutes per session: Not on file  . Stress: Not on file  Relationships  . Social connections:    Talks on phone: Not on file    Gets together: Not  on file    Attends religious service: Not on file    Active member of club or organization: Not on file    Attends meetings of clubs or organizations: Not on file    Relationship status: Not on file  . Intimate partner violence:    Fear of current or ex partner: Not on file    Emotionally abused: Not on file    Physically abused: Not on file    Forced sexual activity: Not on file  Other Topics Concern  . 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 Colon cancer Mother   . Breast cancer Sister   . Cancer Sister 72       Breast  . Heart attack Father 67       Deceased  . Cancer Maternal Aunt        Breast  . Colon cancer Paternal Grandmother        40'S  . Healthy Son   . Anesthesia problems Neg Hx     Objective: Office vital signs  reviewed. BP 111/67   Pulse 61   Temp 98.1 F (36.7 C) (Oral)   Ht 5\' 1"  (1.549 m)   Wt 139 lb (63 kg)   LMP 12/13/2011   BMI 26.26 kg/m   Physical Examination:  General: Awake, alert, well nourished, well appearing. No acute distress HEENT: Normal    Neck: No masses palpated. No lymphadenopathy    Ears: Tympanic membranes intact, normal light reflex, no erythema, no bulging    Eyes: PERRLA, extraocular membranes intact, sclera white     Nose: nasal turbinates moist, clear  nasal discharge    Throat: moist mucus membranes, mild oropharyngeal erythema, no tonsillar exudate.  Airway is patent Cardio: regular rate and rhythm, S1S2 heard, no murmurs appreciated Pulm: clear to auscultation bilaterally, no wheezes, rhonchi or rales; normal work of breathing on room air   Assessment/ Plan: 57 y.o. female   1. Sore throat Patient is afebrile well-appearing.  Physical exam was remarkable for mild oropharyngeal erythema.  Rapid strep was negative.  This is been sent for strep culture given her exposure to persons infected with strep.  Magic mouthwash with lidocaine provided for sore throat.  Instructions for use discussed.  We will contact her with results of the strep culture once available.  Follow-up PRN. - Culture, Group A Strep - Rapid Strep Screen ([facility] ONLY)  2. B12 deficiency B12 injection was measured on today's visit. - cyanocobalamin ((VITAMIN B-12)) injection 1,000 mcg   Orders Placed This Encounter  Procedures  . Culture, Group A Strep    Order Specific Question:   Source    Answer:   throat  . Rapid Strep Screen ([facility] ONLY)   Meds ordered this encounter  Medications  . cyanocobalamin ((VITAMIN B-12)) injection 1,000 mcg  . magic mouthwash w/lidocaine SOLN    Sig: Gargle and spit 79mL every 6 hours as needed for sore throat    Dispense:  240 mL    Refill:  0    33mL of Lidocaine in 480 mL of Magic mouthwash     [place],  DO [place] [phone]

## 2018-08-06 LAB — CULTURE, GROUP A STREP: STREP A CULTURE: POSITIVE — AB

## 2018-08-09 ENCOUNTER — Encounter: Payer: Self-pay | Admitting: Family Medicine

## 2018-08-09 MED ORDER — FLUCONAZOLE 150 MG PO TABS: 150.0000 mg | ORAL_TABLET | Freq: Once | ORAL | 0 refills | Status: AC

## 2018-08-09 MED ORDER — AMOXICILLIN 875 MG PO TABS: 875.0000 mg | ORAL_TABLET | Freq: Two times a day (BID) | ORAL | 0 refills | Status: DC

## 2018-08-09 NOTE — Addendum Note (Signed)
Addended by: Chevis Pretty on: 08/09/2018 09:51 AM   Modules accepted: Orders

## 2018-08-14 ENCOUNTER — Ambulatory Visit (HOSPITAL_COMMUNITY): Payer: BC Managed Care – PPO | Admitting: Psychiatry

## 2018-08-14 ENCOUNTER — Encounter (HOSPITAL_COMMUNITY): Payer: Self-pay | Admitting: Psychiatry

## 2018-08-14 DIAGNOSIS — F339 Major depressive disorder, recurrent, unspecified: Secondary | ICD-10-CM
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
## 2018-08-27 ENCOUNTER — Encounter: Payer: BC Managed Care – PPO | Admitting: Psychology

## 2018-08-29 ENCOUNTER — Other Ambulatory Visit: Payer: Self-pay | Admitting: Pediatrics

## 2018-08-29 DIAGNOSIS — R002 Palpitations: Secondary | ICD-10-CM

## 2018-09-23 ENCOUNTER — Encounter (HOSPITAL_COMMUNITY): Payer: Self-pay | Admitting: Psychiatry

## 2018-09-23 ENCOUNTER — Ambulatory Visit (HOSPITAL_COMMUNITY): Payer: BC Managed Care – PPO | Admitting: Psychiatry

## 2018-09-23 DIAGNOSIS — F339 Major depressive disorder, recurrent, unspecified: Secondary | ICD-10-CM

## 2018-09-23 NOTE — Progress Notes (Signed)
   THERAPIST PROGRESS NOTE  Session Time: Monday 09/23/2018 4:10 PM - 4:55 PM   Participation Level: Active  Behavioral Response: CasualAlertDepressed  Type of Therapy: Individual Therapy  Treatment Goals addressed: establish rapport, learn and implement cognitive and behavioral strategies to overcome depression  Interventions: CBT and Supportive  Summary: Mandy Vargas is a 58 y.o. female who is referred for services by PCP Dr. Assunta Found due to patient experiencing symptoms of anxiety and depressions. She denies any psychiatric hospitalizations. She reports participating in outpatient medication management with Dr Pauline Good earlier this year and was diagnosed with Bipolar Disorder. She was taking Latuda and had adverse reaction included irritability, elevated cholesterol and glucose level. Her PCP currently is managing her medication.  patient reports having postpartum depression after the birth of her 70 year old son. Since that time she has had recurrent periods of depression with one occurring in 2013 after her divorce and another one in 2017 when her mother who has Alzheimer's came to live with patient. Her mother now is in a nursing home and patient visits her daily. She was additional stress related to husband who does not help her around the house per her report. 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  Patient last was seen about a month ago for her assessment appointment. She reports little to no change in symptoms since that time. She continues to worry about mother who is in a nursing home. Per patient's report, nursing home is understaffed as she expresses fear mother may not receive adequate care. She reports continued stress regarding her marriage and expresses regrets about marrying her husband 3 years ago. She reports they have communication issues. They attend marital  counseling but it is not helpful per patient's report. Patient also continues to reports stress related to her job. She reports little to no involvement in self nurturing activities     Suicidal/Homicidal: Nowithout intent/plan  Therapist Response: establish rapport, reviewed symptoms, discussed stressors, facilitated expression of thoughts and feelings, validated feelings, discussed the role of self nurturing activities, assisted patient identify activities that deplete her energy and began to identify activities patient can pursue they give her energy and increase self nurturing, assisted patient developed plan to walk 3-4 days a week, assisted patient identify and address thoughts and processes that may inhibit her plan  Plan: Return again in 2 weeks.  Diagnosis: Axis I: MDD, Recurrent    Axis II: No diagnosis    Zailee Vallely, LCSW 09/23/2018

## 2018-10-07 ENCOUNTER — Ambulatory Visit (HOSPITAL_COMMUNITY): Payer: BC Managed Care – PPO | Admitting: Psychiatry

## 2018-10-07 ENCOUNTER — Encounter (HOSPITAL_COMMUNITY): Payer: Self-pay | Admitting: Psychiatry

## 2018-10-07 DIAGNOSIS — F339 Major depressive disorder, recurrent, unspecified: Secondary | ICD-10-CM | POA: Diagnosis not present

## 2018-10-07 NOTE — Progress Notes (Signed)
   THERAPIST PROGRESS NOTE  Session Time: Monday 10/07/2018 4:05 PM - 4:55 PM         Participation Level: Active  Behavioral Response: CasualAlertDepressed  Type of Therapy: Individual Therapy  Treatment Goals addressed: establish rapport, learn and implement cognitive and behavioral strategies to overcome depression  Interventions: CBT and Supportive  Summary: Mandy Vargas is a 57 y.o. female who is referred for services by PCP Dr. Assunta Found due to patient experiencing symptoms of anxiety and depressions. She denies any psychiatric hospitalizations. She reports participating in outpatient medication management with Dr Pauline Good earlier this year and was diagnosed with Bipolar Disorder. She was taking Latuda and had adverse reaction included irritability, elevated cholesterol and glucose level. Her PCP currently is managing her medication.  patient reports having postpartum depression after the birth of her 37 year old son. Since that time she has had recurrent periods of depression with one occurring in 2013 after her divorce and another one in 2017 when her mother who has Alzheimer's came to live with patient. Her mother now is in a nursing home and patient visits her daily. She was additional stress related to husband who does not help her around the house per her report. 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  Patient last was seen about two weeks ago. She reports continued depressed mood, poor motivation, irritability, worry, and fatigue. She reports she is 2 months overdue for a vitamin B shot but plans to schedule this week. She says she doesn't think current antidepressant is working that well but still expresses reluctance to see psychiatrist. She reports implementing plan developed in session and walking 6 times since last session. She says this was helpful. She expresses  continued frustration with husband as he does not help around the house the way she would like. She also reports continued worry about mother's welfare. She continues to visit nursing home daily and reports little time for self.   Suicidal/Homicidal: Nowithout intent/plan  Therapist Response: praised and reinforced patient's efforts to increase physical activity, discussed effects, reviewed symptoms, discussed stressors, facilitated expression of thoughts and feelings, validated feelings, developed treatment plan  Plan: Return again in 2 weeks.  Diagnosis: Axis I: MDD, Recurrent    Axis II: No diagnosis    [name], LCSW 10/07/2018

## 2018-10-11 ENCOUNTER — Ambulatory Visit (INDEPENDENT_AMBULATORY_CARE_PROVIDER_SITE_OTHER): Payer: BC Managed Care – PPO | Admitting: *Deleted

## 2018-10-11 DIAGNOSIS — E538 Deficiency of other specified B group vitamins: Secondary | ICD-10-CM | POA: Diagnosis not present

## 2018-10-11 NOTE — Progress Notes (Signed)
Pt given Cyanocobalamin inj Tolerated well 

## 2018-10-21 ENCOUNTER — Encounter (HOSPITAL_COMMUNITY): Payer: Self-pay | Admitting: Psychiatry

## 2018-10-21 ENCOUNTER — Ambulatory Visit (HOSPITAL_COMMUNITY): Payer: BC Managed Care – PPO | Admitting: Psychiatry

## 2018-10-21 DIAGNOSIS — F339 Major depressive disorder, recurrent, unspecified: Secondary | ICD-10-CM | POA: Diagnosis not present

## 2018-10-21 NOTE — Progress Notes (Signed)
   THERAPIST PROGRESS NOTE  Session Time: Monday 10/21/2018 4:07 PM -  4:58 PM           Participation Level: Active  Behavioral Response: CasualAlertDepressed  Type of Therapy: Individual Therapy  Treatment Goals addressed:  learn and implement cognitive and behavioral strategies to overcome depression  Interventions: CBT and Supportive  Summary: CHELLSIE GOMER is a 58 y.o. female who is referred for services by PCP Dr. Assunta Found due to patient experiencing symptoms of anxiety and depressions. She denies any psychiatric hospitalizations. She reports participating in outpatient medication management with Dr Pauline Good earlier this year and was diagnosed with Bipolar Disorder. She was taking Latuda and had adverse reaction included irritability, elevated cholesterol and glucose level. Her PCP currently is managing her medication.  patient reports having postpartum depression after the birth of her 74 year old son. Since that time she has had recurrent periods of depression with one occurring in 2013 after her divorce and another one in 2017 when her mother who has Alzheimer's came to live with patient. Her mother now is in a nursing home and patient visits her daily. She was additional stress related to husband who does not help her around the house per her report. 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  Patient last was seen about two weeks ago. She reports continued depressed mood, poor motivation, irritability, worry, and fatigue. She reports getting Vitamin B shot this past Friday but says it hasn't helped. She reports being unable to see psychiatrist for financial reasons but agrees to work with current ob-gyn or talk with PCP once she finds another one regarding medication. She has reduced visits to mother and made arrangements with Hospice Nurse to see mother on the days patient  doesn't visit. She says this has provided some relief but expresses guilt mother is in nursing home. She reports thoughts of having let mother down. She reports continued stress regarding marriage as she states husband doesn't help around the house and is not attentive to her needs. Patient reports she has continued walking about 3 x per week. She also reports recently enjoying celebrating grandson's birthday.  Suicidal/Homicidal: Nowithout intent/plan  Therapist Response: praised and reinforced patient's efforts to reduce time at nursing home and schedule time for self, discussed effects, oriented patient to CBT, continued to discuss ways to increase behavioral activation, discussed stressors, facilitated expression of thoughts/feelings, validated feelings,  assisted patient identify/challenge/and replace thoughts evoking guilt/depression with helpful thoughts,  Discussed patient's concerns regarding medication and offered referral to psychiatrist, patient declined due to financial reasons but agrees to follow up with ob-gyn or PCP  Plan: Return again in 2 weeks.  Diagnosis: Axis I: MDD, Recurrent    Axis II: No diagnosis    [name], LCSW 10/21/2018

## 2018-10-30 ENCOUNTER — Telehealth: Payer: Self-pay | Admitting: Family Medicine

## 2018-10-30 NOTE — Telephone Encounter (Signed)
PT is wanting to speak to Barnett Applebaum about a referral

## 2018-10-30 NOTE — Telephone Encounter (Signed)
appt made

## 2018-11-04 ENCOUNTER — Encounter: Payer: Self-pay | Admitting: Family Medicine

## 2018-11-04 ENCOUNTER — Ambulatory Visit: Payer: BC Managed Care – PPO | Admitting: Family Medicine

## 2018-11-04 ENCOUNTER — Ambulatory Visit (HOSPITAL_COMMUNITY): Payer: BC Managed Care – PPO | Admitting: Psychiatry

## 2018-11-04 VITALS — BP 100/70 | HR 69 | Temp 97.6°F | Ht 61.0 in | Wt 138.0 lb

## 2018-11-04 DIAGNOSIS — R5383 Other fatigue: Secondary | ICD-10-CM

## 2018-11-04 DIAGNOSIS — F418 Other specified anxiety disorders: Secondary | ICD-10-CM | POA: Diagnosis not present

## 2018-11-04 DIAGNOSIS — F4322 Adjustment disorder with anxiety: Secondary | ICD-10-CM | POA: Diagnosis not present

## 2018-11-04 MED ORDER — BUSPIRONE HCL 5 MG PO TABS
ORAL_TABLET | ORAL | 0 refills | Status: DC
Start: 2018-11-04 — End: 2018-12-02

## 2018-11-04 NOTE — Progress Notes (Signed)
Subjective: CC: chronic fatigue PCP: [name], DO PZW:CHENI H Burlison is a 58 y.o. female presenting to clinic today for:  1. Chronic fatigue Patient with longstanding history of fatigue.  She attributes this to depression and anxiety symptoms.  She has had borderline B12 deficiency in the past as well that was treated by her previous PCP with B12 injections, which she does feel works better than the oral.  She reports history of anemia but denies any blood loss from any orifice.  She does note a family history of thyroid disease in her mother but is never been personally diagnosed with thyroid disorder.  She is on 150 mg of Zoloft daily and 300 mg of Wellbutrin daily for depression.  She is also followed by counselor in [place].  She was previously treated by a psychiatrist with [place] and notes that she had significant intolerance to that medication.  She has since discontinued seeing the psychiatrist.  No SI, HI.  No tremor, diarrhea, constipation or unplanned weight fluctuations.  She does report increased stress related to her mother's Alzheimer's disease and the fact that she has been in a nursing home.  She struggles with this and often worries if she will develop dementia.  She has plan to see a neuropsychiatrist in May but worries about waiting that long for further evaluation for Alzheimer's.   ROS: Per HPI  Allergies  Allergen Reactions  . Amitriptyline     Tried 33m in past, made her feel "out of it"   Past Medical History:  Diagnosis Date  . [place]emia   [place]   Left - DCIS  . Bruises easily   . Depression   . Nephrolithiasis    Status post lithotripsy  . Palpitations   . Wears glasses     Current Outpatient Medications:  .  buPROPion (WELLBUTRIN XL) 300 MG 24 hr tablet, Take 300 mg by mouth daily., Disp: , Rfl:  .  diazepam (VALIUM) 2 MG tablet, Take 2 mg by mouth at bedtime., Disp: , Rfl: 0 .  metoprolol tartrate (LOPRESSOR) 25 MG  tablet, TAKE 1 TABLET BY MOUTH TWO TIMES DAILY, Disp: 180 tablet, Rfl: 0 .  Multiple Vitamin (MULTIVITAMIN) tablet, Take 1 tablet by mouth daily., Disp: , Rfl:  .  Red Yeast Rice Extract (RED YEAST RICE PO), Take by mouth., Disp: , Rfl:  .  sertraline (ZOLOFT) 50 MG tablet, Take 150 mg by mouth daily. , Disp: , Rfl: 0  Current Facility-Administered Medications:  .  cyanocobalamin ((VITAMIN B-12)) injection 1,000 mcg, 1,000 mcg, Intramuscular, Q30 days, VEustaquio Maize MD, 1,000 mcg at 10/11/18 1643 Social History   Socioeconomic History  . Marital status: Married    Spouse name: Not on file  . Number of children: Not on file  . Years of education: Not on file  . Highest education level: Not on file  Occupational History  . Not on file  Social Needs  . Financial resource strain: Not on file  . Food insecurity:    Worry: Not on file    Inability: Not on file  . Transportation needs:    Medical: Not on file    Non-medical: Not on file  Tobacco Use  . Smoking status: Former Smoker    Packs/day: 0.25    Years: 1.00    Pack years: 0.25    Types: Cigarettes    Last attempt to quit: 09/12/2011    Years since quitting: 7.1  .  Smokeless tobacco: Never Used  Substance and Sexual Activity  . Alcohol use: No    Alcohol/week: 0.0 standard drinks  . Drug use: No  . Sexual activity: Yes    Birth control/protection: Post-menopausal  Lifestyle  . Physical activity:    Days per week: Not on file    Minutes per session: Not on file  . Stress: Not on file  Relationships  . Social connections:    Talks on phone: Not on file    Gets together: Not on file    Attends religious service: Not on file    Active member of club or organization: Not on file    Attends meetings of clubs or organizations: Not on file    Relationship status: Not on file  . Intimate partner violence:    Fear of current or ex partner: Not on file    Emotionally abused: Not on file    Physically abused: Not on file     Forced sexual activity: Not on file  Other Topics Concern  . 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 Colon cancer Mother   . Alzheimer's disease Mother   . Depression Mother   . Breast cancer Sister   . Cancer Sister 49       Breast  . Bipolar disorder Sister   . Heart attack Father 80       Deceased  . Cancer Maternal Aunt        Breast  . Colon cancer Paternal Grandmother        35'S  . Healthy Son   . Anesthesia problems Neg Hx     Objective: Office vital signs reviewed. BP 100/70   Pulse 69   Temp 97.6 F (36.4 C) (Oral)   Ht _0  (1.549 m)   Wt 138 lb (62.6 kg)   LMP 12/13/2011   BMI 26.07 kg/m   Physical Examination:  General: Awake, alert, well nourished, No acute distress HEENT: Normal, sclera white.  No exophthalmos.  No goiter  Cardio: regular rate and rhythm, S1S2 heard, no murmurs appreciated Pulm: clear to auscultation bilaterally, no wheezes, rhonchi or rales; normal work of breathing on room air Extremities: warm, well perfused, No edema, cyanosis or clubbing; +2 pulses bilaterally Skin: dry; intact; no rashes or lesions; normal temperature Neuro: no tremor Psych: Mood stable, speech normal, affect somewhat flat.  She is pleasant and interactive.  Does not appear to be responding to internal stimuli  Depression screen [facility] 2/9 11/04/2018 08/02/2018 03/28/2018  Decreased Interest _1 Down, Depressed, Hopeless _2 PHQ - 2 Score _3 Altered sleeping _4 Tired, decreased energy 3 0 3  Change in appetite 1 0 3  Feeling bad or failure about yourself  1 1 0  Trouble concentrating 2 0 2  Moving slowly or fidgety/restless 1 0 2  Suicidal thoughts 0 0 0  PHQ-9 Score _5 Difficult doing work/chores - - Somewhat difficult  Some recent data might be hidden   GAD 7 : Generalized Anxiety Score  11/04/2018  Nervous, Anxious, on Edge 3  Control/stop worrying 3  Worry too much - different things 3  Trouble relaxing 3  Restless 3  Easily annoyed or irritable 3  Afraid - awful might happen 1  Total GAD 7 Score 19    Assessment/ Plan: 58 y.o. female   1. Anxiety associated with depression Seems to be in flare compared to her check in November.  She is already on quite high doses of Zoloft and Wellbutrin.  We discussed adding buspirone to see if perhaps this might offset some of the anxiety and depressive symptoms she has been experiencing.  Instructions for taper were discussed with the patient.  We will also check metabolic labs to further evaluate fatigue.  Continues in counseling. As above. - busPIRone (BUSPAR) 5 MG tablet; Take 1 tablet (5 mg total) by mouth 2 (two) times daily for 7 days, THEN 1.5 tablets (7.5 mg total) 2 (two) times daily for 7 days, THEN 2 tablets (10 mg total) 2 (two) times daily for 14 days.  Dispense: 91 tablet; Refill: 0  2. Adjustment disorder with anxious mood - busPIRone (BUSPAR) 5 MG tablet; Take 1 tablet (5 mg total) by mouth 2 (two) times daily for 7 days, THEN 1.5 tablets (7.5 mg total) 2 (two) times daily for 7 days, THEN 2 tablets (10 mg total) 2 (two) times daily for 14 days.  Dispense: 91 tablet; Refill: 0  3. Fatigue, unspecified type As above. - TSH - Vitamin B12 - CMP14+EGFR - CBC   Orders Placed This Encounter  Procedures  . TSH  . Vitamin B12  . CMP14+EGFR  . CBC   Meds ordered this encounter  Medications  . busPIRone (BUSPAR) 5 MG tablet    Sig: Take 1 tablet (5 mg total) by mouth 2 (two) times daily for 7 days, THEN 1.5 tablets (7.5 mg total) 2 (two) times daily for 7 days, THEN 2 tablets (10 mg total) 2 (two) times daily for 14 days.    Dispense:  91 tablet    Refill:  [place], DO [place] [phone]

## 2018-11-04 NOTE — Patient Instructions (Signed)
You had labs performed today.  You will be contacted with the results of the labs once they are available, usually in the next 3 business days for routine lab work.  If you had a pap smear or biopsy performed, expect to be contacted in about 7-10 days.  Continue the Zoloft and Wellbutrin as directed. We have added buspirone today for your mood and anxiety:  Start with 1 tablet (5 mg) twice daily.  If you are still having symptoms in 1 week, you may increase to 1-1/2 tablets twice daily.  If after another week you are still having significant anxiety or depression, you may increase to 2 tablets twice daily.  See me in 4 weeks for recheck.

## 2018-11-05 LAB — CMP14+EGFR
ALT: 32 IU/L (ref 0–32)
AST: 31 IU/L (ref 0–40)
Albumin/Globulin Ratio: 1.7 (ref 1.2–2.2)
Albumin: 4.1 g/dL (ref 3.8–4.9)
Alkaline Phosphatase: 65 IU/L (ref 39–117)
BUN/Creatinine Ratio: 22 (ref 12–28)
BUN: 20 mg/dL (ref 8–27)
Bilirubin Total: <0.2 mg/dL (ref 0.0–1.2)
CALCIUM: 9.4 mg/dL (ref 8.7–10.3)
CO2: 22 mmol/L (ref 20–29)
CREATININE: 0.92 mg/dL (ref 0.57–1.00)
Chloride: 102 mmol/L (ref 96–106)
GFR, EST AFRICAN AMERICAN: 78 mL/min/{1.73_m2} (ref >59)
GFR, EST NON AFRICAN AMERICAN: 68 mL/min/{1.73_m2} (ref >59)
GLUCOSE: 102 mg/dL — AB (ref 65–99)
Globulin, Total: 2.4 g/dL (ref 1.5–4.5)
POTASSIUM: 4.5 mmol/L (ref 3.5–5.2)
Sodium: 141 mmol/L (ref 134–144)
TOTAL PROTEIN: 6.5 g/dL (ref 6.0–8.5)

## 2018-11-05 LAB — TSH: TSH: 1.8 u[IU]/mL (ref 0.450–4.500)

## 2018-11-05 LAB — CBC
Hematocrit: 37.9 % (ref 34.0–46.6)
Hemoglobin: 12.4 g/dL (ref 11.1–15.9)
MCH: 29.2 pg (ref 26.6–33.0)
MCHC: 32.7 g/dL (ref 31.5–35.7)
MCV: 89 fL (ref 79–97)
Platelets: 161 10*3/uL (ref 150–450)
RBC: 4.24 x10E6/uL (ref 3.77–5.28)
RDW: 13 % (ref 11.7–15.4)
WBC: 5.3 10*3/uL (ref 3.4–10.8)

## 2018-11-05 LAB — VITAMIN B12: VITAMIN B 12: 756 pg/mL (ref 232–1245)

## 2018-11-08 ENCOUNTER — Ambulatory Visit: Payer: BC Managed Care – PPO | Admitting: Family Medicine

## 2018-11-11 ENCOUNTER — Telehealth: Payer: Self-pay | Admitting: Licensed Clinical Social Worker

## 2018-11-11 ENCOUNTER — Ambulatory Visit (INDEPENDENT_AMBULATORY_CARE_PROVIDER_SITE_OTHER): Payer: BC Managed Care – PPO | Admitting: *Deleted

## 2018-11-11 DIAGNOSIS — E538 Deficiency of other specified B group vitamins: Secondary | ICD-10-CM | POA: Diagnosis not present

## 2018-11-11 NOTE — Telephone Encounter (Signed)
Followed call to client this morning. She answered the phone but stated that it wasn't a good time to talk. She stated that she was in the beauty shop and would call back at a later time. Counselor provided client with contact number to return call.

## 2018-11-11 NOTE — Telephone Encounter (Signed)
Error In Charting

## 2018-11-11 NOTE — Progress Notes (Signed)
Followed call to client this morning. She answered the phone but stated that it wasn't a good time to talk. She stated that she was in the beauty shop and would call back at a later time. Counselor provided client with contact number to return call.

## 2018-11-18 ENCOUNTER — Ambulatory Visit (HOSPITAL_COMMUNITY): Payer: BC Managed Care – PPO | Admitting: Psychiatry

## 2018-11-18 DIAGNOSIS — F339 Major depressive disorder, recurrent, unspecified: Secondary | ICD-10-CM | POA: Diagnosis not present

## 2018-11-18 NOTE — Progress Notes (Signed)
   THERAPIST PROGRESS NOTE  Session Time: Monday 11/18/2018 4:10 PM - 4:58 PM    Participation Level: Active  Behavioral Response: CasualAlertDepressed  Type of Therapy: Individual Therapy  Treatment Goals addressed:  learn and implement cognitive and behavioral strategies to overcome depression  Interventions: CBT and Supportive  Summary: Mandy Vargas is a 58 y.o. female who is referred for services by PCP Dr. Assunta Found due to patient experiencing symptoms of anxiety and depressions. She denies any psychiatric hospitalizations. She reports participating in outpatient medication management with Dr Pauline Good earlier this year and was diagnosed with Bipolar Disorder. She was taking Latuda and had adverse reaction included irritability, elevated cholesterol and glucose level. Her PCP currently is managing her medication.  patient reports having postpartum depression after the birth of her 40 year old son. Since that time she has had recurrent periods of depression with one occurring in 2013 after her divorce and another one in 2017 when her mother who has Alzheimer's came to live with patient. Her mother now is in a nursing home and patient visits her daily. She was additional stress related to husband who does not help her around the house per her report. 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  Patient last was seen about two weeks ago. She reports talking with PCP regarding medication and says zoloft was discontinued and buspar was prescribed. She continues to take welbutrin. She also recently received Vitamin B-12 injection. She continues to report depressed mood, poor motivation, irritability, worry, and fatigue. She reports deep guilt about her mother being in a nursing home. She expresses frustration and anger her sister and aunt don't visit patient's mother the way she thinks they  should. She also expresses sadness regarding the loss of relationship with her mother as she once knew it. She saw her yesterday and reports being really upset after the visit.  She reports less marital stress as husband is helping out more around the house.    Suicidal/Homicidal: Nowithout intent/plan  Therapist Response: praised and reinforced patient's efforts to discuss medication concerns with PCP regarding medication, discussed stressors, facilitated expression of thoughts/feelings, validated feelings,  assisted patient identify/challenge/and replace thoughts evoking guilt/depression with helpful thoughts by replacing unrealistic demands with realistic expectations, discussed increasing support by contacting support group for caretakers of Alzheimer's patients, developed plan with patient to contact local group or online group  Plan: Return again in 2 weeks.  Diagnosis: Axis I: MDD, Recurrent    Axis II: No diagnosis    [name], LCSW 11/18/2018

## 2018-11-26 ENCOUNTER — Other Ambulatory Visit: Payer: Self-pay | Admitting: Family Medicine

## 2018-11-26 DIAGNOSIS — F418 Other specified anxiety disorders: Secondary | ICD-10-CM

## 2018-11-26 DIAGNOSIS — F4322 Adjustment disorder with anxiety: Secondary | ICD-10-CM

## 2018-11-28 ENCOUNTER — Other Ambulatory Visit: Payer: Self-pay | Admitting: Pediatrics

## 2018-11-28 DIAGNOSIS — R002 Palpitations: Secondary | ICD-10-CM

## 2018-12-02 ENCOUNTER — Other Ambulatory Visit: Payer: Self-pay

## 2018-12-02 ENCOUNTER — Telehealth (INDEPENDENT_AMBULATORY_CARE_PROVIDER_SITE_OTHER): Payer: BC Managed Care – PPO | Admitting: Family Medicine

## 2018-12-02 DIAGNOSIS — R0989 Other specified symptoms and signs involving the circulatory and respiratory systems: Secondary | ICD-10-CM | POA: Diagnosis not present

## 2018-12-02 DIAGNOSIS — F418 Other specified anxiety disorders: Secondary | ICD-10-CM | POA: Diagnosis not present

## 2018-12-02 DIAGNOSIS — J3089 Other allergic rhinitis: Secondary | ICD-10-CM

## 2018-12-02 DIAGNOSIS — F4322 Adjustment disorder with anxiety: Secondary | ICD-10-CM | POA: Diagnosis not present

## 2018-12-02 MED ORDER — BUSPIRONE HCL 10 MG PO TABS: 10.0000 mg | ORAL_TABLET | Freq: Two times a day (BID) | ORAL | 3 refills | Status: DC

## 2018-12-02 NOTE — Patient Instructions (Signed)
I have sent over your Buspirone at the 10mg  dose.  This will be ONE tablet TWICE daily for anxiety/ stress.  Continue your other medications as prescribed.  We discussed use of Delsym or plain Robitussin for chest decongestion.  Increase clear fluids and consider humidification to break up mucus.  You could consider an antihistamine like Claritin or Zyrtec to help with nasal allergies (drainage).  Nasal Allergies Nasal allergies are a reaction to allergens in the air. Allergens are particles in the air that cause your body to have an allergic reaction. Nasal allergies are not passed from person to person (are not contagious). They cannot be cured, but they can be controlled. What are the causes? Seasonal nasal allergies (hay fever) are caused by pollen allergens that come from grasses, trees, and weeds. Year-round nasal allergies (perennial allergic rhinitis) are caused by allergens such as house dust mites, pet dander, and mold spores. What increases the risk? The following factors may make you more likely to develop this condition:  Having certain health conditions. These include: ? Other types of allergies, such as food allergies. ? Asthma. ? Eczema.  Having a close relative who has allergies or asthma.  Exposure to house dust, pollen, dander, or other allergens at home or at work.  Exposure to air pollution or secondhand smoke when you were a child. What are the signs or symptoms? Symptoms of this condition include:  Sneezing.  Runny nose or stuffy nose (congestion).  Watery (tearing) eyes.  Itchy eyes, nose, mouth, throat, skin, or other area.  Sore throat.  Headache.  Decreased sense of smell or taste.  Fatigue. This may occur if you have trouble sleeping due to allergies.  Swollen eyelids. How is this diagnosed? This condition is diagnosed with a medical history and physical exam. Allergy testing may be done to determine exactly what triggers your nasal  allergies. How is this treated? There is no cure for nasal allergies. Treatment focuses on controlling your symptoms, and it may include:  Medicines that block allergy symptoms. These may include allergy shots, nasal sprays, and oral antihistamines.  Avoiding the allergen. Follow these instructions at home:  Avoid the allergen that is causing your symptoms, if possible.  Keep windows closed. If possible, use air conditioning when pollen counts are high.  Do not use fans in your home.  Do not hang clothes outside to dry.  Wear sunglasses to keep pollen out of your eyes.  Wash your hands right away after you touch household pets.  Take over-the-counter and prescription medicines only as told by your health care provider.  Keep all follow-up visits as told by your health care provider. This is important. Contact a health care provider if:  You have a fever.  You develop a cough that does not go away (is persistent).  You start to wheeze.  Your symptoms do not improve with treatment.  You have thick nasal discharge.  You start to have nosebleeds. Get help right away if:  Your tongue or your lips are swollen.  You have trouble breathing.  You feel light-headed or you feel like you are going to faint.  You have cold sweats. This information is not intended to replace advice given to you by your health care provider. Make sure you discuss any questions you have with your health care provider. Document Released: 08/28/2005 Document Revised: 01/31/2016 Document Reviewed: 03/10/2015 Elsevier Interactive Patient Education  2019 [employer].

## 2018-12-02 NOTE — Progress Notes (Signed)
Telephone visit  Subjective: CC: f/u Depression/ anxiety PCP: Mandy Norlander, DO XTK:WIOXB [name] is a 58 y.o. female calls for telephone consult today. Patient provides verbal consent for consult held via phone.  Location of patient: [name] Location of provider: [facility] Others present for call: Friends is present (but not  1. Depression/ anxiety History: long standing hx depression/ anxiety.  She is treated with Zoloft 150 mg and Wellbutrin 300 mg.  She is followed by counselor in [place].  History of intolerance to [place].  Patient notes quite a bit of improvement in mood since starting buspirone.  She is on 10 mg p.o. twice daily in addition to her Zoloft 150 mg daily and Wellbutrin 300 mg daily.  She continues to follow-up with a counselor.  She reports improvement in her worrying.  Additionally, she notes not seeing her mother, who is in a nursing home, regularly seems to be helping with some of the anxiety.  She continues to have some worry but it seems to be to a lesser extent now.  2.  Congestion Patient reports a few month history of chest congestion that seems intermittent.  Is associated with rhinorrhea.  She is used Mucinex with various degrees of improvement.  She denies any cough, fevers, hemoptysis, shortness of breath or sneezing.  She is not on an oral antihistamine.   ROS: Per HPI  Allergies  Allergen Reactions  . Amitriptyline     Tried 10mg  in past, made her feel "out of it"   Past Medical History:  Diagnosis Date  . Anemia   . 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 Bruises easily   . Depression   . Nephrolithiasis    Status post lithotripsy  . Palpitations   . Wears glasses     Current Outpatient Medications:  .  buPROPion (WELLBUTRIN XL) 300 MG 24 hr tablet, Take 300 mg by mouth daily., Disp: , Rfl:  .  busPIRone (BUSPAR) 5 MG tablet, Take 1 tablet (5 mg total) by mouth 2 (two) times daily for 7 days, THEN 1.5 tablets (7.5 mg total) 2  (two) times daily for 7 days, THEN 2 tablets (10 mg total) 2 (two) times daily for 14 days., Disp: 91 tablet, Rfl: 0 .  diazepam (VALIUM) 2 MG tablet, Take 2 mg by mouth at bedtime., Disp: , Rfl: 0 .  metoprolol tartrate (LOPRESSOR) 25 MG tablet, TAKE 1 TABLET BY MOUTH TWICE A DAY, Disp: 180 tablet, Rfl: 1 .  Multiple Vitamin (MULTIVITAMIN) tablet, Take 1 tablet by mouth daily., Disp: , Rfl:  .  Red Yeast Rice Extract (RED YEAST RICE PO), Take by mouth., Disp: , Rfl:  .  sertraline (ZOLOFT) 50 MG tablet, Take 150 mg by mouth daily. , Disp: , Rfl: 0  Current Facility-Administered Medications:  .  cyanocobalamin ((VITAMIN B-12)) injection 1,000 mcg, 1,000 mcg, Intramuscular, Q30 days, Eustaquio Maize, MD, 1,000 mcg at 11/11/18 1532  PHQ9: 6 GAD7: 5  Depression screen [facility] 2/9 11/04/2018 08/02/2018 03/28/2018  Decreased Interest 3 1 3   Down, Depressed, Hopeless 3 1 3   PHQ - 2 Score 6 2 6   Altered sleeping 1 1 2   Tired, decreased energy 3 0 3  Change in appetite 1 0 3  Feeling bad or failure about yourself  1 1 0  Trouble concentrating 2 0 2  Moving slowly or fidgety/restless 1 0 2  Suicidal thoughts 0 0 0  PHQ-9 Score 15 4 18   Difficult  doing work/chores - - Somewhat difficult  Some recent data might be hidden   GAD 7 : Generalized Anxiety Score 11/04/2018  Nervous, Anxious, on Edge 3  Control/stop worrying 3  Worry too much - different things 3  Trouble relaxing 3  Restless 3  Easily annoyed or irritable 3  Afraid - awful might happen 1  Total GAD 7 Score 19    Assessment/ Plan: 58 y.o. female   1. Adjustment disorder with anxious mood Seemingly improving.  We went through a PHQ 9 and gad 7 questionnaire today over the phone and both of these scores have improved since her visit in February.  She is doing well on 10 mg twice daily of BuSpar in addition to her SSRI and Wellbutrin.  We will continue current dose and refills have been sent to her pharmacy.  She will follow-up with  me in 3 months, sooner if needed. - busPIRone (BUSPAR) 10 MG tablet; Take 1 tablet (10 mg total) by mouth 2 (two) times daily. (Please note dose change)  Dispense: 60 tablet; Refill: 3  2. Anxiety associated with depression As above - busPIRone (BUSPAR) 10 MG tablet; Take 1 tablet (10 mg total) by mouth 2 (two) times daily. (Please note dose change)  Dispense: 60 tablet; Refill: 3  3. Chest congestion Likely related to allergy symptoms.  Okay to continue Mucinex versus Delsym versus Robitussin for decongestant.  We discussed consideration for oral antihistamine given rhinorrhea.  Push clear fluids.  Consider humidification.  4. Non-seasonal allergic rhinitis, unspecified trigger As above   Start time: 1:26pm End time: 1:38pm  Meds ordered this encounter  Medications  . busPIRone (BUSPAR) 10 MG tablet    Sig: Take 1 tablet (10 mg total) by mouth 2 (two) times daily. (Please note dose change)    Dispense:  60 tablet    Refill:  [place], DO [place] [phone]

## 2018-12-04 ENCOUNTER — Other Ambulatory Visit: Payer: Self-pay

## 2018-12-04 ENCOUNTER — Other Ambulatory Visit: Payer: Self-pay | Admitting: Family Medicine

## 2018-12-04 ENCOUNTER — Ambulatory Visit (HOSPITAL_COMMUNITY): Payer: BC Managed Care – PPO | Admitting: Psychiatry

## 2018-12-04 DIAGNOSIS — F4322 Adjustment disorder with anxiety: Secondary | ICD-10-CM

## 2018-12-04 DIAGNOSIS — F339 Major depressive disorder, recurrent, unspecified: Secondary | ICD-10-CM

## 2018-12-04 DIAGNOSIS — F418 Other specified anxiety disorders: Secondary | ICD-10-CM

## 2018-12-04 NOTE — Progress Notes (Signed)
   THERAPIST PROGRESS NOTE  Session Time: Wednesday 12/04/2018 4:12 PM -  4:55 PM         Participation Level: Active  Behavioral Response: CasualAlertDepressed  Type of Therapy: Individual Therapy  Treatment Goals addressed:  learn and implement cognitive and behavioral strategies to overcome depression  Interventions: CBT and Supportive  Summary: Mandy Vargas is a 58 y.o. female who is referred for services by PCP Dr. Assunta Found due to patient experiencing symptoms of anxiety and depressions. She denies any psychiatric hospitalizations. She reports participating in outpatient medication management with Dr Pauline Good earlier this year and was diagnosed with Bipolar Disorder. She was taking Latuda and had adverse reaction included irritability, elevated cholesterol and glucose level. Her PCP currently is managing her medication.  patient reports having postpartum depression after the birth of her 45 year old son. Since that time she has had recurrent periods of depression with one occurring in 2013 after her divorce and another one in 2017 when her mother who has Alzheimer's came to live with patient. Her mother now is in a nursing home and patient visits her daily. She was additional stress related to husband who does not help her around the house per her report. 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  Patient last was seen about two weeks ago. She reports she hasn't seen mother in 2 weeks due to prohibitions from visiting her at nursing home due to coronavirus. Patient reports managing this better than she thought she would as she accepts she can't visit mother. She also reports no longer feeling guilty that she can't take care of mother at home. She talks with mother on phone and still goes to nursing home to pick up mother's clothing to wash as well as provide mother with snacks. She  has increased behavioral activation at home as schools are closed. 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 She has had face to face contact with friends. She also reports improved relationship with husband as she says she has learned she can't change husband and now is able to let some things go. She reports experiencing pleasure at times but states still feeling "yucky". Today, she reports she discontinued taking zoloft on her own for about a week but resuming use about a week ago as she was having a difficult time functioning. She reports being on same dosage of zoloft for well over a year and says it just doesn't seem to be helping. Therapist discussed role of medication in treating depression and  again offered patient opportunity to see psychiatrist but patient reports she will talk with PCP about medication.    Suicidal/Homicidal: Nowithout intent/plan  Therapist Response: reviewed symptoms,  administered PHQ-9, praised and reinforced patient's increased involvement in activity, discussed effects, assisted patient identify connection between thoughts/mood/and behavior using examples from patient's life, discussed the role of medication in treating depression and encouraged patient to follow through with plans to talk to PCP regarding medication. discussed the role of exercise/physical activity in managing mental health, assisted patient develop plan to walk M-W-F 20-30 minutes   Plan: Return again in 2 weeks.  Diagnosis: Axis I: MDD, Recurrent    Axis II: No diagnosis    [name], LCSW 12/04/2018

## 2018-12-09 ENCOUNTER — Telehealth: Payer: Self-pay | Admitting: Family Medicine

## 2018-12-09 NOTE — Telephone Encounter (Signed)
Pt states she doesn't feel like the Buspar is helping at all. She is taking Wellbutrin and Zoloft as well and doesn't feel like she is benefiting, thinks maybe she might need to change medications. Please advise.

## 2018-12-09 NOTE — Telephone Encounter (Signed)
I literally just talked to her a week ago and she said she was doing well on it.  I'm not sure what's going on.  Please place her on the e-visit schedule.

## 2018-12-10 ENCOUNTER — Ambulatory Visit (INDEPENDENT_AMBULATORY_CARE_PROVIDER_SITE_OTHER): Payer: BC Managed Care – PPO | Admitting: Family Medicine

## 2018-12-10 ENCOUNTER — Other Ambulatory Visit: Payer: Self-pay

## 2018-12-10 DIAGNOSIS — F321 Major depressive disorder, single episode, moderate: Secondary | ICD-10-CM

## 2018-12-10 DIAGNOSIS — F418 Other specified anxiety disorders: Secondary | ICD-10-CM

## 2018-12-10 MED ORDER — SERTRALINE HCL 100 MG PO TABS: 200.0000 mg | ORAL_TABLET | Freq: Every day | ORAL | 1 refills | Status: DC

## 2018-12-10 NOTE — Patient Instructions (Addendum)
Increase your Zoloft to 200mg  daily.  A new prescription has been sent in.  Continue Wellbutrin and Buspirone as directed.  The alternative medication we discussed was Trintellix.  Taking the medicine as directed and not missing any doses is one of the best things you can do to treat your depression.  Here are some things to keep in mind:  1) Side effects (stomach upset, some increased anxiety) may happen before you notice a benefit.  These side effects typically go away over time. 2) Changes to your dose of medicine or a change in medication all together is sometimes necessary 3) Most people need to be on medication at least 12 months 4) Many people will notice an improvement within two weeks but the full effect of the medication can take up to 4-6 weeks 5) Stopping the medication when you start feeling better often results in a return of symptoms 6) Never discontinue your medication without contacting a health care professional first.  Some medications require gradual discontinuation/ taper and can make you sick if you stop them abruptly.  If your symptoms worsen or you have thoughts of suicide/homicide, PLEASE SEEK IMMEDIATE MEDICAL ATTENTION.  You may always call:  National Suicide Hotline: 772-125-5160 [place]: 252 344 0664 Crisis Recovery in [place]: [phone]   These are available 24 hours a day, 7 days a week.

## 2018-12-10 NOTE — Progress Notes (Signed)
Telephone visit  Subjective: CC: anxiety/ depression PCP: [name], DO WYO:VZCHY Mandy Vargas is a 58 y.o. female calls for telephone consult today. Patient provides verbal consent for consult held via phone.  Location of patient: home Location of provider: [facility] Others present for call: none  1. Anxiety/ depression Concern was last addressed about 1 week ago through a telemedicine encounter.  During that visit, she noted quite a bit of improvement in mood on buspirone 10 mg p.o. twice daily in addition to Zoloft 150 mg and Wellbutrin 300 mg daily.  However, yesterday she contacted our office via telephone stating that the BuSpar was not helping at all and that she thought perhaps she needed a change in medication.   From her recent encounter with her counselor, it appears that she had in fact discontinued use of the Zoloft independently and then resume use about a week ago because she was nonfunctional off of the medication.  Referral to psychiatry was offered but patient had declined this and instead wished to speak to me with regards to her medications.  She reports that symptoms have not necessarily worsened she just not feel that symptoms are especially controlled.  She spoke to her counselor recently and they thought that she perhaps might benefit from a medication change.  Again she reiterates that she had intolerance to [place].  There is a significant family history and personal history of depressive disorder.  She reports low energy and depressive symptoms surrounding her mother's diagnosis and other stressors at home.  ROS: Per HPI  Allergies  Allergen Reactions  . Amitriptyline     Tried 10mg  in past, made her feel "out of it"   Past Medical History:  Diagnosis Date  . Anemia   . 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 Bruises easily   . Depression   . Nephrolithiasis    Status post lithotripsy  . Palpitations   . Wears glasses     Current Outpatient  Medications:  .  buPROPion (WELLBUTRIN XL) 300 MG 24 hr tablet, Take 300 mg by mouth daily., Disp: , Rfl:  .  busPIRone (BUSPAR) 5 MG tablet, TAKE 1 TAB 2 TIMES DAILY X7DAYS, 1.5 TABS 2 TIMES DAILY X7DAYS, THEN 2 TABS 2 TIMES DAILY X14DAYS, Disp: 91 tablet, Rfl: 0 .  diazepam (VALIUM) 2 MG tablet, Take 2 mg by mouth at bedtime., Disp: , Rfl: 0 .  metoprolol tartrate (LOPRESSOR) 25 MG tablet, TAKE 1 TABLET BY MOUTH TWICE A DAY, Disp: 180 tablet, Rfl: 1 .  Multiple Vitamin (MULTIVITAMIN) tablet, Take 1 tablet by mouth daily., Disp: , Rfl:  .  Red Yeast Rice Extract (RED YEAST RICE PO), Take by mouth., Disp: , Rfl:  .  sertraline (ZOLOFT) 50 MG tablet, Take 150 mg by mouth daily. , Disp: , Rfl: 0  Current Facility-Administered Medications:  .  cyanocobalamin ((VITAMIN B-12)) injection 1,000 mcg, 1,000 mcg, Intramuscular, Q30 days, Eustaquio Maize, MD, 1,000 mcg at 11/11/18 1532  Assessment/ Plan: 58 y.o. female   1. Current moderate episode of major depressive disorder without prior episode ([place]) Uncontrolled.  We discussed options including increasing dose of Zoloft to 200 mg versus increase in Wellbutrin to 450 mg versus titration off of SSRI and Wellbutrin and onto Trintellix.  We will proceed with increased dose of Zoloft.  We will plan to reconvene in about 4 to 6 weeks, sooner if needed.  May need to consider also increasing Wellbutrin to 450 mg  though I am unsure as to how beneficial this might be.  She may be a good candidate for Trintellix and we might be able to get her off both the Wellbutrin and SSRI if we made this transition.  However, cost could potentially be a limiting factor.  We discussed alternatives and she will follow-up with me as directed as above. - sertraline (ZOLOFT) 100 MG tablet; Take 2 tablets (200 mg total) by mouth daily.  Dispense: 60 tablet; Refill: 1  2. Anxiety associated with depression Continue buspirone.  Increase in Zoloft as above. - sertraline (ZOLOFT) 100  MG tablet; Take 2 tablets (200 mg total) by mouth daily.  Dispense: 60 tablet; Refill: 1   Start time: 2:50pm End time: 3:02pm  Total time spent on patient care (including telephone call/ virtual visit): 18 minutes  [place], [place] [phone]

## 2018-12-12 NOTE — Telephone Encounter (Signed)
Pt had TELEVISIT with Dr Darnell Level 12/10/18. Will close encounter.

## 2018-12-16 ENCOUNTER — Ambulatory Visit (HOSPITAL_COMMUNITY): Payer: BC Managed Care – PPO | Admitting: Psychiatry

## 2018-12-16 ENCOUNTER — Ambulatory Visit: Payer: BC Managed Care – PPO

## 2018-12-23 ENCOUNTER — Telehealth: Payer: Self-pay | Admitting: Family Medicine

## 2018-12-23 ENCOUNTER — Ambulatory Visit (INDEPENDENT_AMBULATORY_CARE_PROVIDER_SITE_OTHER): Payer: BC Managed Care – PPO | Admitting: Family Medicine

## 2018-12-23 ENCOUNTER — Other Ambulatory Visit: Payer: Self-pay

## 2018-12-23 DIAGNOSIS — R6889 Other general symptoms and signs: Secondary | ICD-10-CM | POA: Diagnosis not present

## 2018-12-23 NOTE — Progress Notes (Signed)
Telephone visit  Subjective: CC: Congestion PCP: [name], DO MWN:UUVOZ H Erskin is a 58 y.o. female calls for telephone consult today. Patient provides verbal consent for consult held via phone.  Location of patient: home Location of provider: [facility] Others present for call: none  1. Congestion Patient reports intermittent congestion has been ongoing for several weeks.  She notes that it is predominantly at nighttime where she feels like she needs to clear her throat.  Denies drainage, including rhinorrhea, sneezing or fevers.  No overt coughing.  She is used Mucinex with little improvement in symptoms and is wondering if she can transition over to Robitussin.   ROS: Per HPI  Allergies  Allergen Reactions  . Amitriptyline     Tried 10mg  in past, made her feel "out of it"   Past Medical History:  Diagnosis Date  . Anemia   . 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 Bruises easily   . Depression   . Nephrolithiasis    Status post lithotripsy  . Palpitations   . Wears glasses     Current Outpatient Medications:  .  buPROPion (WELLBUTRIN XL) 300 MG 24 hr tablet, Take 300 mg by mouth daily., Disp: , Rfl:  .  busPIRone (BUSPAR) 5 MG tablet, TAKE 1 TAB 2 TIMES DAILY X7DAYS, 1.5 TABS 2 TIMES DAILY X7DAYS, THEN 2 TABS 2 TIMES DAILY X14DAYS, Disp: 91 tablet, Rfl: 0 .  diazepam (VALIUM) 2 MG tablet, Take 2 mg by mouth at bedtime., Disp: , Rfl: 0 .  metoprolol tartrate (LOPRESSOR) 25 MG tablet, TAKE 1 TABLET BY MOUTH TWICE A DAY, Disp: 180 tablet, Rfl: 1 .  Multiple Vitamin (MULTIVITAMIN) tablet, Take 1 tablet by mouth daily., Disp: , Rfl:  .  Red Yeast Rice Extract (RED YEAST RICE PO), Take by mouth., Disp: , Rfl:  .  sertraline (ZOLOFT) 100 MG tablet, Take 2 tablets (200 mg total) by mouth daily., Disp: 60 tablet, Rfl: 1  Current Facility-Administered Medications:  .  cyanocobalamin ((VITAMIN B-12)) injection 1,000 mcg, 1,000 mcg, Intramuscular, Q30 days, Eustaquio Maize, MD, 1,000 mcg at 11/11/18 1532  Assessment/ Plan: 58 y.o. female   1. Congestion of throat At this time, does not sound infectious and is only intermittent.  Okay to transition from Mucinex to Robitussin she find this more helpful.  Have also recommended consideration for allergy pill and humidification.  Continue to push clear fluids.  Follow-up PRN   Start time: 3:08pm End time: 3:12 pm  Total time spent on patient care (including telephone call/ virtual visit): 8 minutes  [place], [place] [phone]

## 2018-12-23 NOTE — Telephone Encounter (Signed)
Spoke with patient- she states she has congestion and sinus drainage.  Advised robitussin is usually most helpful for cough.  Suggested trying mucinex.  She states she has already tried mucinex.  Scheduled her for telephone visit with Dr. Lajuana Ripple tomorrow.

## 2018-12-23 NOTE — Telephone Encounter (Signed)
Left patient a voicemail to return call.

## 2018-12-24 ENCOUNTER — Ambulatory Visit: Payer: BC Managed Care – PPO

## 2018-12-24 ENCOUNTER — Other Ambulatory Visit: Payer: Self-pay | Admitting: Family Medicine

## 2018-12-24 ENCOUNTER — Ambulatory Visit: Payer: Self-pay | Admitting: Family Medicine

## 2018-12-24 DIAGNOSIS — F418 Other specified anxiety disorders: Secondary | ICD-10-CM

## 2018-12-24 DIAGNOSIS — F4322 Adjustment disorder with anxiety: Secondary | ICD-10-CM

## 2018-12-24 MED ORDER — BUSPIRONE HCL 10 MG PO TABS: 20.0000 mg | ORAL_TABLET | Freq: Two times a day (BID) | ORAL | 1 refills | Status: DC

## 2019-01-01 ENCOUNTER — Other Ambulatory Visit: Payer: Self-pay

## 2019-01-01 ENCOUNTER — Ambulatory Visit (INDEPENDENT_AMBULATORY_CARE_PROVIDER_SITE_OTHER): Payer: BC Managed Care – PPO | Admitting: Family Medicine

## 2019-01-01 ENCOUNTER — Encounter: Payer: Self-pay | Admitting: Family Medicine

## 2019-01-01 DIAGNOSIS — F418 Other specified anxiety disorders: Secondary | ICD-10-CM | POA: Diagnosis not present

## 2019-01-01 DIAGNOSIS — M940 Chondrocostal junction syndrome [Tietze]: Secondary | ICD-10-CM | POA: Diagnosis not present

## 2019-01-01 MED ORDER — BUPROPION HCL ER (XL) 150 MG PO TB24: ORAL_TABLET | ORAL | 0 refills | Status: DC

## 2019-01-01 NOTE — Progress Notes (Signed)
Telephone visit  Subjective: CC: f/u GAD/ Depression PCP: [name], DO NKN:LZJQB H Brockwell is a 58 y.o. female calls for telephone consult today. Patient provides verbal consent for consult held via phone.  Location of patient: home Location of provider: [facility] Others present for call: none  1.  Anxiety/depression Patient reports ongoing symptoms of anxiety and depression.  She was evaluated via telehealth 3 weeks ago, at which point we increased both her buspirone to 20 mg twice daily and her Zoloft to 200 mg daily.  Our plan was to follow-up at the 6 weeks interval.  She is also on Wellbutrin 300 mg daily.  She notes that anxiety symptoms are doing okay and seem to be improving with the addition of the buspirone.  However, her depressive symptoms continue.  She continues to follow-up with counseling services.  She wonders if she would benefit from a different medication.  She cites ongoing stress surrounding her mother, who is in a facility.  2.  Rib pain Patient reports that she lifted a young child at school recently and subsequently has some pain across her breast plate on the right side, particularly with taking a deep breath in.  She denies any shortness of breath, hemoptysis, cough or fever.  She has not taken any medications for the issue.  ROS: Per HPI  Allergies  Allergen Reactions  . Amitriptyline     Tried 10mg  in past, made her feel "out of it"   Past Medical History:  Diagnosis Date  . Anemia   . 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 Bruises easily   . Depression   . Nephrolithiasis    Status post lithotripsy  . Palpitations   . Wears glasses     Current Outpatient Medications:  .  buPROPion (WELLBUTRIN XL) 300 MG 24 hr tablet, Take 300 mg by mouth daily., Disp: , Rfl:  .  busPIRone (BUSPAR) 10 MG tablet, Take 2 tablets (20 mg total) by mouth 2 (two) times daily., Disp: 60 tablet, Rfl: 1 .  diazepam (VALIUM) 2 MG tablet, Take 2 mg by mouth at  bedtime., Disp: , Rfl: 0 .  metoprolol tartrate (LOPRESSOR) 25 MG tablet, TAKE 1 TABLET BY MOUTH TWICE A DAY, Disp: 180 tablet, Rfl: 1 .  Multiple Vitamin (MULTIVITAMIN) tablet, Take 1 tablet by mouth daily., Disp: , Rfl:  .  Red Yeast Rice Extract (RED YEAST RICE PO), Take by mouth., Disp: , Rfl:  .  sertraline (ZOLOFT) 100 MG tablet, Take 2 tablets (200 mg total) by mouth daily., Disp: 60 tablet, Rfl: 1  Current Facility-Administered Medications:  .  cyanocobalamin ((VITAMIN B-12)) injection 1,000 mcg, 1,000 mcg, Intramuscular, Q30 days, Eustaquio Maize, MD, 1,000 mcg at 11/11/18 1532  Assessment/ Plan: 58 y.o. female   1. Anxiety associated with depression We discussed her options including taper off of both the Wellbutrin and Zoloft and taper onto something like Trintellix.  Alternatively, I did offer referral to psychiatry for medication management.  Patient wishes to start taper off of Wellbutrin and allow an additional 3 to 4 weeks for the Zoloft to be in full effect.  I think this plan is reasonable.  She will continue the buspirone at current dose, Zoloft at 200 mg daily.  I have given her a new prescription for Wellbutrin XL 150.  She will switch to this dose daily for the next 2 weeks then go to every other day for 2 weeks then stop.  At  which point, we will plan to follow-up and decide whether or not she should start taper of the Zoloft and transition over to Trintellix.  I have also encouraged her to contact her insurance company to see how affordable this brand-name antidepressant is. - buPROPion (WELLBUTRIN XL) 150 MG 24 hr tablet; Take 1 tablet (150 mg total) by mouth daily for 14 days, THEN 1 tablet (150 mg total) every other day for 14 days.  Dispense: 30 tablet; Refill: 0  2. Costochondritis Based on her description of events I suspect this is costochondritis.  I did inform her that she could take an oral NSAID if she desired to help with any discomfort related to this.  However,  I would strongly advise her to take this totally separately from her SSRI as it can increase risk for GI bleed.  Additionally, she may use ice applied to the affected area.  We discussed reasons for emergent evaluation in the emergency department.  She was good understanding.   Start time: 1:16pm End time: 1:28pm  Total time spent on patient care (including telephone call/ virtual visit): 15 minutes  [place], [place] [phone]

## 2019-01-04 ENCOUNTER — Other Ambulatory Visit: Payer: Self-pay | Admitting: Family Medicine

## 2019-01-06 NOTE — Telephone Encounter (Signed)
Pharmacy comment: REQUEST FOR 90 DAYS PRESCRIPTION.

## 2019-01-15 ENCOUNTER — Ambulatory Visit (INDEPENDENT_AMBULATORY_CARE_PROVIDER_SITE_OTHER): Payer: BC Managed Care – PPO | Admitting: Family Medicine

## 2019-01-15 ENCOUNTER — Other Ambulatory Visit: Payer: Self-pay

## 2019-01-15 DIAGNOSIS — F418 Other specified anxiety disorders: Secondary | ICD-10-CM

## 2019-01-15 MED ORDER — BUSPIRONE HCL 10 MG PO TABS: 10.0000 mg | ORAL_TABLET | Freq: Two times a day (BID) | ORAL | 0 refills | Status: DC

## 2019-01-15 NOTE — Progress Notes (Signed)
Telephone visit  Subjective: CC: f/u depression/ anxiety PCP: [name], DO GNF:AOZHY Mandy Vargas is a 58 y.o. female calls for telephone consult today. Patient provides verbal consent for consult held via phone.  Location of patient: home Location of provider: [facility] Others present for call: none  1.  Depression/anxiety Patient reports that she has been experiencing excessive daytime sleepiness with regards to use of the buspirone 10 mg p.o. twice daily.  She continues on Zoloft 200 mg daily and is weaning from Wellbutrin.  She is asking what we can do to modify her medication.   ROS: Per HPI  Allergies  Allergen Reactions  . Amitriptyline     Tried 10mg  in past, made her feel "out of it"   Past Medical History:  Diagnosis Date  . Anemia   . 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 Bruises easily   . Depression   . Nephrolithiasis    Status post lithotripsy  . Palpitations   . Wears glasses     Current Outpatient Medications:  .  buPROPion (WELLBUTRIN XL) 150 MG 24 hr tablet, Take 1 tablet (150 mg total) by mouth daily for 14 days, THEN 1 tablet (150 mg total) every other day for 14 days., Disp: 30 tablet, Rfl: 0 .  busPIRone (BUSPAR) 10 MG tablet, Take 2 tablets (20 mg total) by mouth 2 (two) times daily., Disp: 360 tablet, Rfl: 0 .  diazepam (VALIUM) 2 MG tablet, Take 2 mg by mouth at bedtime., Disp: , Rfl: 0 .  metoprolol tartrate (LOPRESSOR) 25 MG tablet, TAKE 1 TABLET BY MOUTH TWICE A DAY, Disp: 180 tablet, Rfl: 1 .  Multiple Vitamin (MULTIVITAMIN) tablet, Take 1 tablet by mouth daily., Disp: , Rfl:  .  Red Yeast Rice Extract (RED YEAST RICE PO), Take by mouth., Disp: , Rfl:  .  sertraline (ZOLOFT) 100 MG tablet, Take 2 tablets (200 mg total) by mouth daily., Disp: 60 tablet, Rfl: 1  Current Facility-Administered Medications:  .  cyanocobalamin ((VITAMIN B-12)) injection 1,000 mcg, 1,000 mcg, Intramuscular, Q30 days, Eustaquio Maize, MD, 1,000 mcg at  11/11/18 1532  Assessment/ Plan: 58 y.o. female   1. Anxiety associated with depression I have advised her to either half the buspirone back down to 5 mg twice daily or she can try taking 20 mg nightly since it is causing sleep.  We discussed that this may result in uncontrolled anxiety during the day since it is a 8 to 12-hour medication.  She is working on weaning from the Wellbutrin and continues on Zoloft 200 mg.  I again approached her about considering seeing a psychiatrist as I do think that they may be able to offer her more with regards to medication management and better control of her symptoms.  She is contemplative.   Start time: 10:16am End time: 10:21am  Total time spent on patient care (including telephone call/ virtual visit): 6 minutes  [place], [place] [phone]

## 2019-01-20 ENCOUNTER — Ambulatory Visit (HOSPITAL_COMMUNITY): Payer: BC Managed Care – PPO | Admitting: Psychiatry

## 2019-01-21 ENCOUNTER — Other Ambulatory Visit: Payer: Self-pay

## 2019-01-21 ENCOUNTER — Ambulatory Visit (INDEPENDENT_AMBULATORY_CARE_PROVIDER_SITE_OTHER): Payer: BC Managed Care – PPO | Admitting: Psychiatry

## 2019-01-21 DIAGNOSIS — F339 Major depressive disorder, recurrent, unspecified: Secondary | ICD-10-CM

## 2019-01-21 NOTE — Progress Notes (Signed)
Virtual Visit via Telephone Note  I connected with Mandy Vargas on 01/21/19 at 10:00 AM EDT by telephone and verified that I am speaking with the correct person using two identifiers.   I discussed the limitations, risks, security and privacy concerns of performing an evaluation and management service by telephone and the availability of in person appointments. I also discussed with the patient that there may be a patient responsible charge related to this service. The patient expressed understanding and agreed to proceed.    I provided 43 minutes of non-face-to-face time during this encounter.   [name], LCSW    THERAPIST PROGRESS NOTE  Session Time: Tuesday 01/21/2019 10:15 AM - 10:58 AM       Participation Level: Active  Behavioral Response: CasualAlertDepressed  Type of Therapy: Individual Therapy  Treatment Goals addressed:  learn and implement cognitive and behavioral strategies to overcome depression  Interventions: CBT and Supportive  Summary: [name] is a 58 y.o. female who is referred for services by PCP Dr. Assunta Found due to patient experiencing symptoms of anxiety and depressions. She denies any psychiatric hospitalizations. She reports participating in outpatient medication management with Dr Pauline Good earlier this year and was diagnosed with Bipolar Disorder. She was taking Latuda and had adverse reaction included irritability, elevated cholesterol and glucose level. Her PCP currently is managing her medication.  patient reports having postpartum depression after the birth of her 34 year old son. Since that time she has had recurrent periods of depression with one occurring in 2013 after her divorce and another one in 2017 when her mother who has Alzheimer's came to live with patient. Her mother now is in a nursing home and patient visits her daily. She was additional stress related to husband who does not help her around the house per her report. 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  Patient last was seen about 6 weeks ago. She reports talking to PCP regarding medication and now is taking increased dosage of Zoloft. She says it hasn't really helped and states still feeling "yucky". She also reports irritability and states feeling really tired in the afternoons. She continues to express sadness and regret regarding mother being in a nursing home. She reports additional stress regarding husband who can be irritable. Patient reports continued involvement in various activities like household projects and  socialization with friends. She continues to work as well. She also implemented walking plan and says this has been helpful.  She reports experiencing pleasure at times.  Suicidal/Homicidal: Nowithout intent/plan  Therapist Response: reviewed symptoms, praised and reinforced patient's increased involvement in activity/implementing walking plan, discussed effects,  encouraged patient to follow up with PCdP hrough with  regarding medication, began to examine patient's thought patterns and assisted patient began to identify effects on her mood/behavior.   Plan: Return again in 2 weeks.  Diagnosis: Axis I: MDD, Recurrent    Axis II: No diagnosis    [name], LCSW 01/21/2019

## 2019-01-24 ENCOUNTER — Other Ambulatory Visit: Payer: Self-pay | Admitting: Family Medicine

## 2019-01-24 DIAGNOSIS — F418 Other specified anxiety disorders: Secondary | ICD-10-CM

## 2019-01-30 ENCOUNTER — Telehealth: Payer: Self-pay | Admitting: Family Medicine

## 2019-02-05 ENCOUNTER — Ambulatory Visit (INDEPENDENT_AMBULATORY_CARE_PROVIDER_SITE_OTHER): Payer: BC Managed Care – PPO | Admitting: Family Medicine

## 2019-02-05 ENCOUNTER — Encounter: Payer: Self-pay | Admitting: Family Medicine

## 2019-02-05 ENCOUNTER — Other Ambulatory Visit: Payer: Self-pay

## 2019-02-05 DIAGNOSIS — R911 Solitary pulmonary nodule: Secondary | ICD-10-CM | POA: Diagnosis not present

## 2019-02-05 DIAGNOSIS — F418 Other specified anxiety disorders: Secondary | ICD-10-CM

## 2019-02-05 MED ORDER — BUPROPION HCL ER (XL) 300 MG PO TB24: 300.0000 mg | ORAL_TABLET | Freq: Every day | ORAL | 0 refills | Status: DC

## 2019-02-05 MED ORDER — BUSPIRONE HCL 10 MG PO TABS
ORAL_TABLET | ORAL | 0 refills | Status: DC
Start: 2019-02-05 — End: 2019-04-24

## 2019-02-05 NOTE — Progress Notes (Signed)
Telephone visit  Subjective: CC: f/u depression/ anxiety PCP: [name], DO BTD:VVOHY Mandy Vargas is a 58 y.o. female calls for telephone consult today. Patient provides verbal consent for consult held via phone.  Location of patient: car Location of provider: [facility] Others present for call: none  1.  Depression/anxiety She reports increase in depressive symptoms since trying to titrate off of the Wellbutrin.  She would like to resume her 300 mg dose if possible.  She is compliant with the BuSpar 5 mg every morning and 10 mg every afternoon as well as Zoloft 200 mg daily.  She is trying to work through her depression and anxiety, particularly related to her mother who she is not able to see because she is in a facility.  She tries to do virtual visits with her mother which does seem to help some.  2.  Rib pain Patient reports ongoing rib pain but this time on the opposite side of her chest.  She notes some pain with deep breathing.  Denies any shortness of breath, wheezing, hemoptysis or unplanned weight loss.  She has not tried any oral NSAIDs.  Symptoms seem to have started after she lifted a child but she notes a history of breast surgery on that side as well.  Her last mammogram was normal.  She goes on to state that she had an image of her abdomen in 2017 when she was diagnosed with colitis that showed a pulmonary nodule that she never had followed up because of lack of insurance/financial restrictions.  She would like to go ahead and proceed with this.   ROS: Per HPI  Allergies  Allergen Reactions  . Amitriptyline     Tried 10mg  in past, made her feel "out of it"   Past Medical History:  Diagnosis Date  . Anemia   . 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 Bruises easily   . Depression   . Nephrolithiasis    Status post lithotripsy  . Palpitations   . Wears glasses     Current Outpatient Medications:  .  buPROPion (WELLBUTRIN XL) 150 MG 24 hr tablet, Take 1 tablet  (150 mg total) by mouth daily for 14 days, THEN 1 tablet (150 mg total) every other day for 14 days., Disp: 30 tablet, Rfl: 0 .  busPIRone (BUSPAR) 10 MG tablet, Take 1 tablet (10 mg total) by mouth 2 (two) times daily., Disp: 360 tablet, Rfl: 0 .  diazepam (VALIUM) 2 MG tablet, Take 2 mg by mouth at bedtime., Disp: , Rfl: 0 .  metoprolol tartrate (LOPRESSOR) 25 MG tablet, TAKE 1 TABLET BY MOUTH TWICE A DAY, Disp: 180 tablet, Rfl: 1 .  Multiple Vitamin (MULTIVITAMIN) tablet, Take 1 tablet by mouth daily., Disp: , Rfl:  .  Red Yeast Rice Extract (RED YEAST RICE PO), Take by mouth., Disp: , Rfl:  .  sertraline (ZOLOFT) 100 MG tablet, Take 2 tablets (200 mg total) by mouth daily., Disp: 60 tablet, Rfl: 1  Current Facility-Administered Medications:  .  cyanocobalamin ((VITAMIN B-12)) injection 1,000 mcg, 1,000 mcg, Intramuscular, Q30 days, Eustaquio Maize, MD, 1,000 mcg at 11/11/18 1532  Assessment/ Plan: 58 y.o. female   1. Solitary pulmonary nodule I reviewed her CT abdomen pelvis from April 2017 which did indeed demonstrate a pleural/diaphragmatic nodule on the right measuring 11 x 7 mm.  Unfortunately this never was further evaluated as recommended as patient was unable to afford the repeat imaging.  Given the  fact that she is having some associated pleuritic chest pain she is wanting to go ahead and pursue this.  I have ordered this imaging study and patient will contact her insurance to make sure that she can afford this.  If not, we may need to consider simply obtaining a chest x-ray.  However, we did discuss that this is neither as sensitive or as specific as obtaining a CT chest.  She voiced good understanding - CT CHEST NODULE FOLLOW UP LOW DOSE W/O; Future  2. Anxiety associated with depression Not controlled and therefore we will resume use of 300 mg of Wellbutrin daily.  Continue Zoloft and buspirone.  Follow-up PRN - buPROPion (WELLBUTRIN XL) 300 MG 24 hr tablet; Take 1 tablet (300 mg  total) by mouth daily.  Dispense: 90 tablet; Refill: 0 - busPIRone (BUSPAR) 10 MG tablet; Take 5mg  every morning and 10mg  every evening.  Dispense: 360 tablet; Refill: 0  Start time: 2:56pm End time: 3:09pm  Total time spent on patient care (including telephone call/ virtual visit): 18 minutes  [place], [place] [phone]

## 2019-02-06 ENCOUNTER — Other Ambulatory Visit: Payer: Self-pay | Admitting: Family Medicine

## 2019-02-06 ENCOUNTER — Telehealth: Payer: Self-pay | Admitting: Family Medicine

## 2019-02-06 NOTE — Telephone Encounter (Signed)
FYI

## 2019-02-06 NOTE — Telephone Encounter (Signed)
Pt aware of appointment date/time of 02/21/2019

## 2019-02-06 NOTE — Addendum Note (Signed)
Addended by: [name] on: 02/06/2019 12:49 PM   Modules accepted: Orders

## 2019-02-21 ENCOUNTER — Ambulatory Visit
Admission: RE | Admit: 2019-02-21 | Discharge: 2019-02-21 | Disposition: A | Discharge status: Home / Self Care | Payer: BC Managed Care – PPO | Source: Ambulatory Visit | Attending: Family Medicine | Admitting: Family Medicine

## 2019-02-21 DIAGNOSIS — R911 Solitary pulmonary nodule: Secondary | ICD-10-CM

## 2019-02-22 ENCOUNTER — Other Ambulatory Visit: Payer: Self-pay | Admitting: Family Medicine

## 2019-02-22 DIAGNOSIS — F321 Major depressive disorder, single episode, moderate: Secondary | ICD-10-CM

## 2019-02-22 DIAGNOSIS — F418 Other specified anxiety disorders: Secondary | ICD-10-CM

## 2019-02-24 ENCOUNTER — Ambulatory Visit (INDEPENDENT_AMBULATORY_CARE_PROVIDER_SITE_OTHER): Payer: BC Managed Care – PPO | Admitting: *Deleted

## 2019-02-24 ENCOUNTER — Other Ambulatory Visit: Payer: Self-pay

## 2019-02-24 DIAGNOSIS — E538 Deficiency of other specified B group vitamins: Secondary | ICD-10-CM

## 2019-02-24 NOTE — Progress Notes (Signed)
Pt given Cyanocobalamin inj Tolerated well 

## 2019-02-27 ENCOUNTER — Telehealth: Payer: Self-pay

## 2019-02-27 NOTE — Telephone Encounter (Signed)
Patient notified of CT results. She states that she continues to have pain in her chest and is concerned. Wanted to know what you think she should do next? Please Mandy Vargas

## 2019-02-28 ENCOUNTER — Other Ambulatory Visit: Payer: Self-pay | Admitting: Family Medicine

## 2019-02-28 ENCOUNTER — Telehealth: Payer: Self-pay | Admitting: Family Medicine

## 2019-02-28 DIAGNOSIS — R0789 Other chest pain: Secondary | ICD-10-CM

## 2019-02-28 NOTE — Telephone Encounter (Signed)
I've placed a referral to cardiology to evaluate for atypical chest pain.

## 2019-02-28 NOTE — Telephone Encounter (Signed)
Pt aware of MD feedback and voiced understanding. 

## 2019-03-03 ENCOUNTER — Encounter: Payer: Self-pay | Admitting: Family Medicine

## 2019-03-03 ENCOUNTER — Ambulatory Visit (INDEPENDENT_AMBULATORY_CARE_PROVIDER_SITE_OTHER): Payer: BC Managed Care – PPO | Admitting: Family Medicine

## 2019-03-03 ENCOUNTER — Other Ambulatory Visit: Payer: Self-pay

## 2019-03-03 DIAGNOSIS — J029 Acute pharyngitis, unspecified: Secondary | ICD-10-CM

## 2019-03-03 NOTE — Progress Notes (Signed)
Virtual Visit via telephone Note Due to COVID-19, visit is conducted virtually and was requested by patient. This visit type was conducted due to national recommendations for restrictions regarding the COVID-19 Pandemic (e.g. social distancing) in an effort to limit this patient's exposure and mitigate transmission in our community. All issues noted in this document were discussed and addressed.  A physical exam was not performed with this format.   I connected with Mandy Vargas on 03/03/19 at 0930 by telephone and verified that I am speaking with the correct person using two identifiers. Mandy Vargas is currently located at home and family is currently with them during visit. The provider, Monia Pouch, FNP is located in their office at time of visit.  I discussed the limitations, risks, security and privacy concerns of performing an evaluation and management service by telephone and the availability of in person appointments. I also discussed with the patient that there may be a patient responsible charge related to this service. The patient expressed understanding and agreed to proceed.  Subjective:  Patient ID: Mandy Vargas, female    DOB: 1960/11/08, 58 y.o.   MRN: [id number]  Chief Complaint:  Sinus Problem and Sore Throat   HPI: Mandy Vargas is a 58 y.o. female presenting on 03/03/2019 for Sinus Problem and Sore Throat   Pt reports chronic issues with sinus drainage. States this morning she woke up with a sore throat. She denies fever, chills, cough, shortness of breath, trouble swallowing, or known sick exposures. States she has sinus drainage that irritates her throat at times.   Sinus Problem This is a chronic problem. The problem has been waxing and waning since onset. There has been no fever. She is experiencing no pain. Associated symptoms include headaches and a sore throat. Pertinent negatives include no chills, congestion, coughing, diaphoresis, ear pain, hoarse  voice, neck pain, shortness of breath, sinus pressure, sneezing or swollen glands. Past treatments include nothing.  Sore Throat  This is a new problem. The current episode started today. The problem has been unchanged. Neither side of throat is experiencing more pain than the other. There has been no fever. The pain is at a severity of 2/10. The pain is mild. Associated symptoms include headaches. Pertinent negatives include no abdominal pain, congestion, coughing, diarrhea, drooling, ear discharge, ear pain, hoarse voice, plugged ear sensation, neck pain, shortness of breath, stridor, swollen glands, trouble swallowing or vomiting. She has had no exposure to strep or mono. She has tried nothing for the symptoms.     Relevant past medical, surgical, family, and social history reviewed and updated as indicated.  Allergies and medications reviewed and updated.   Past Medical History:  Diagnosis Date  . Anemia   . 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 Bruises easily   . Depression   . Nephrolithiasis    Status post lithotripsy  . Palpitations   . Wears glasses     Past Surgical History:  Procedure Laterality Date  . BREAST BIOPSY  12/22/11   DCIS w/calcifications  . BREAST LUMPECTOMY Left 02/19/12   Left breast, DCIS, ER/PR+  . DILATION AND CURETTAGE OF UTERUS  1993  . KIDNEY STONE SURGERY      Social History   Socioeconomic History  . Marital status: Married    Spouse name: Not on file  . Number of children: Not on file  . Years of education: Not on file  . Highest education level: Not  on file  Occupational History  . Not on file  Social Needs  . Financial resource strain: Not on file  . Food insecurity    Worry: Not on file    Inability: Not on file  . Transportation needs    Medical: Not on file    Non-medical: Not on file  Tobacco Use  . Smoking status: Former Smoker    Packs/day: 0.25    Years: 1.00    Pack years: 0.25    Types: Cigarettes    Quit date:  09/12/2011    Years since quitting: 7.4  . Smokeless tobacco: Never Used  Substance and Sexual Activity  . Alcohol use: No    Alcohol/week: 0.0 standard drinks  . Drug use: No  . Sexual activity: Yes    Birth control/protection: Post-menopausal  Lifestyle  . Physical activity    Days per week: Not on file    Minutes per session: Not on file  . Stress: Not on file  Relationships  . Social Herbalist on phone: Not on file    Gets together: Not on file    Attends religious service: Not on file    Active member of club or organization: Not on file    Attends meetings of clubs or organizations: Not on file    Relationship status: Not on file  . Intimate partner violence    Fear of current or ex partner: Not on file    Emotionally abused: Not on file    Physically abused: Not on file    Forced sexual activity: Not on file  Other Topics Concern  . 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 buPROPion (WELLBUTRIN XL) 300 MG 24 hr tablet Take 1 tablet (300 mg total) by mouth daily.  . busPIRone (BUSPAR) 10 MG tablet Take 5mg  every morning and 10mg  every evening.  . diazepam (VALIUM) 2 MG tablet Take 2 mg by mouth at bedtime.  . metoprolol tartrate (LOPRESSOR) 25 MG tablet TAKE 1 TABLET BY MOUTH TWICE A DAY  . Multiple Vitamin (MULTIVITAMIN) tablet Take 1 tablet by mouth daily.  . Red Yeast Rice Extract (RED YEAST RICE PO) Take by mouth.  . sertraline (ZOLOFT) 100 MG tablet TAKE 2 TABLETS BY MOUTH EVERY DAY   Facility-Administered Encounter Medications as of 03/03/2019  Medication  . cyanocobalamin ((VITAMIN B-12)) injection 1,000 mcg    Allergies  Allergen Reactions  . Amitriptyline     Tried 10mg  in past, made her feel "out of it"    Review of Systems  Constitutional: Negative for appetite change, chills,  diaphoresis, fatigue, fever and unexpected weight change.  HENT: Positive for postnasal drip and sore throat. Negative for congestion, dental problem, drooling, ear discharge, ear pain, facial swelling, hearing loss, hoarse voice, mouth sores, nosebleeds, rhinorrhea, sinus pressure, sinus pain, sneezing, tinnitus, trouble swallowing and voice change.   Respiratory: Negative for cough, choking, chest tightness, shortness of breath and stridor.   Cardiovascular: Negative for chest pain and palpitations.  Gastrointestinal: Negative for abdominal pain, diarrhea and vomiting.  Musculoskeletal: Negative for arthralgias, myalgias, neck pain and neck stiffness.  Neurological: Positive for headaches. Negative for dizziness, weakness and light-headedness.  Psychiatric/Behavioral: Negative for confusion.  All other systems reviewed and are negative.        Observations/Objective: No vital  signs or physical exam, this was a telephone or virtual health encounter.  Pt alert and oriented, answers all questions appropriately, and able to speak in full sentences.    Assessment and Plan: Mandy Vargas was seen today for sinus problem and sore throat.  Diagnoses and all orders for this visit:  Viral pharyngitis Reported symptoms consistent with viral pharyngitis. Not concerning for acute bacterial infection due to lack of fever, chills, exudate, or known exposures. Symptomatic care discussed. Tylenol as needed for fever and pain control. Increase water intake. Warm salt water gargles.  Report any new or worsening symptoms.     Follow Up Instructions: Return if symptoms worsen or fail to improve.    I discussed the assessment and treatment plan with the patient. The patient was provided an opportunity to ask questions and all were answered. The patient agreed with the plan and demonstrated an understanding of the instructions.   The patient was advised to call back or seek an in-person evaluation if the  symptoms worsen or if the condition fails to improve as anticipated.  The above assessment and management plan was discussed with the patient. The patient verbalized understanding of and has agreed to the management plan. Patient is aware to call the clinic if symptoms persist or worsen. Patient is aware when to return to the clinic for a follow-up visit. Patient educated on when it is appropriate to go to the emergency department.    I provided 15 minutes of non-face-to-face time during this encounter. The call started at 0930. The call ended at 0945. The other time was used for coordination of care.    Monia Pouch, FNP-C [facility] [address] [place], [place] [address] [phone]

## 2019-03-11 ENCOUNTER — Telehealth: Payer: Self-pay | Admitting: Family Medicine

## 2019-03-11 NOTE — Telephone Encounter (Signed)
Pt called = reviewed CT again with pt

## 2019-03-14 NOTE — Telephone Encounter (Signed)
Phone call taken care of in different encounter.  This encounter will now be closed  

## 2019-03-25 ENCOUNTER — Other Ambulatory Visit: Payer: Self-pay

## 2019-03-26 ENCOUNTER — Ambulatory Visit: Payer: BC Managed Care – PPO

## 2019-03-28 ENCOUNTER — Ambulatory Visit (INDEPENDENT_AMBULATORY_CARE_PROVIDER_SITE_OTHER): Payer: BC Managed Care – PPO | Admitting: *Deleted

## 2019-03-28 ENCOUNTER — Other Ambulatory Visit: Payer: Self-pay

## 2019-03-28 DIAGNOSIS — E538 Deficiency of other specified B group vitamins: Secondary | ICD-10-CM | POA: Diagnosis not present

## 2019-04-03 ENCOUNTER — Telehealth: Payer: Self-pay | Admitting: Family Medicine

## 2019-04-03 NOTE — Telephone Encounter (Signed)
Patient advised that she should do a telephone visit with the provider. Appointment scheduled.

## 2019-04-04 ENCOUNTER — Encounter: Payer: Self-pay | Admitting: Physician Assistant

## 2019-04-04 ENCOUNTER — Ambulatory Visit (INDEPENDENT_AMBULATORY_CARE_PROVIDER_SITE_OTHER): Payer: BC Managed Care – PPO | Admitting: Physician Assistant

## 2019-04-04 ENCOUNTER — Other Ambulatory Visit: Payer: Self-pay

## 2019-04-04 ENCOUNTER — Ambulatory Visit: Payer: BC Managed Care – PPO | Admitting: Physician Assistant

## 2019-04-04 DIAGNOSIS — J019 Acute sinusitis, unspecified: Secondary | ICD-10-CM

## 2019-04-04 DIAGNOSIS — K5100 Ulcerative (chronic) pancolitis without complications: Secondary | ICD-10-CM | POA: Insufficient documentation

## 2019-04-04 DIAGNOSIS — B9689 Other specified bacterial agents as the cause of diseases classified elsewhere: Secondary | ICD-10-CM

## 2019-04-04 MED ORDER — FLUCONAZOLE 150 MG PO TABS: 150.0000 mg | ORAL_TABLET | Freq: Once | ORAL | 0 refills | Status: AC

## 2019-04-04 MED ORDER — AMOXICILLIN-POT CLAVULANATE 875-125 MG PO TABS: 1.0000 | ORAL_TABLET | Freq: Two times a day (BID) | ORAL | 0 refills | Status: DC

## 2019-04-04 MED ORDER — AMOXICILLIN 500 MG PO CAPS: 500.0000 mg | ORAL_CAPSULE | Freq: Three times a day (TID) | ORAL | 0 refills | Status: DC

## 2019-04-04 NOTE — Progress Notes (Signed)
Telephone visit  Subjective: IR:CVELF headache PCP: [name], DO YBO:[id number] H Decaprio is a 58 y.o. female calls for telephone consult today. Patient provides verbal consent for consult held via phone.  Patient is identified with 2 separate identifiers.  At this time the entire area is on COVID-19 social distancing and stay home orders are in place.  Patient is of higher risk and therefore we are performing this by a virtual method.  Location of patient: home Location of provider: HOME Others present for call: no  This patient has had several of sinus headache and postnasal drainage. There is copious drainage at times. Denies any fever at this time. There has been a history of sinus infections in the past.  No history of sinus surgery. She states that she has had slight dizziness. Her maxillary sinuses are quite tender   ROS: Per HPI  Allergies  Allergen Reactions  . Amitriptyline     Tried 10mg  in past, made her feel "out of it"   Past Medical History:  Diagnosis Date  . Anemia   . 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 Bruises easily   . Depression   . Nephrolithiasis    Status post lithotripsy  . Palpitations   . Wears glasses     Current Outpatient Medications:  .  amoxicillin (AMOXIL) 500 MG capsule, Take 1 capsule (500 mg total) by mouth 3 (three) times daily., Disp: 30 capsule, Rfl: 0 .  amoxicillin-clavulanate (AUGMENTIN) 875-125 MG tablet, Take 1 tablet by mouth 2 (two) times daily., Disp: 20 tablet, Rfl: 0 .  buPROPion (WELLBUTRIN XL) 300 MG 24 hr tablet, Take 1 tablet (300 mg total) by mouth daily., Disp: 90 tablet, Rfl: 0 .  busPIRone (BUSPAR) 10 MG tablet, Take 5mg  every morning and 10mg  every evening., Disp: 360 tablet, Rfl: 0 .  diazepam (VALIUM) 2 MG tablet, Take 2 mg by mouth at bedtime., Disp: , Rfl: 0 .  fluconazole (DIFLUCAN) 150 MG tablet, Take 1 tablet (150 mg total) by mouth once for 1 dose., Disp: 1 tablet, Rfl: 0 .  metoprolol  tartrate (LOPRESSOR) 25 MG tablet, TAKE 1 TABLET BY MOUTH TWICE A DAY, Disp: 180 tablet, Rfl: 1 .  Multiple Vitamin (MULTIVITAMIN) tablet, Take 1 tablet by mouth daily., Disp: , Rfl:  .  Red Yeast Rice Extract (RED YEAST RICE PO), Take by mouth., Disp: , Rfl:  .  sertraline (ZOLOFT) 100 MG tablet, TAKE 2 TABLETS BY MOUTH EVERY DAY, Disp: 180 tablet, Rfl: 1  Current Facility-Administered Medications:  .  cyanocobalamin ((VITAMIN B-12)) injection 1,000 mcg, 1,000 mcg, Intramuscular, Q30 days, Eustaquio Maize, MD, 1,000 mcg at 02/24/19 1508  Assessment/ Plan: 58 y.o. female   1. Acute bacterial sinusitis - amoxicillin-clavulanate (AUGMENTIN) 875-125 MG tablet; Take 1 tablet by mouth 2 (two) times daily.  Dispense: 20 tablet; Refill: 0 - fluconazole (DIFLUCAN) 150 MG tablet; Take 1 tablet (150 mg total) by mouth once for 1 dose.  Dispense: 1 tablet; Refill: 0   Return if symptoms worsen or fail to improve.  Continue all other maintenance medications as listed above.  Start time: 3:47 PM End time: 3:54 PM  Meds ordered this encounter  Medications  . amoxicillin (AMOXIL) 500 MG capsule    Sig: Take 1 capsule (500 mg total) by mouth 3 (three) times daily.    Dispense:  30 capsule    Refill:  0    Order Specific Question:   Supervising  Provider    Answer:   [name] [[id number]]  . amoxicillin-clavulanate (AUGMENTIN) 875-125 MG tablet    Sig: Take 1 tablet by mouth 2 (two) times daily.    Dispense:  20 tablet    Refill:  0    Cancel amoxicillin script    Order Specific Question:   Supervising Provider    Answer:   [name] [[id number]]  . fluconazole (DIFLUCAN) 150 MG tablet    Sig: Take 1 tablet (150 mg total) by mouth once for 1 dose.    Dispense:  1 tablet    Refill:  0    Order Specific Question:   Supervising Provider    Answer:   [name] [[id number]]    Particia Nearing PA-C [place] [phone]

## 2019-04-24 ENCOUNTER — Other Ambulatory Visit: Payer: Self-pay | Admitting: Family Medicine

## 2019-04-24 DIAGNOSIS — F418 Other specified anxiety disorders: Secondary | ICD-10-CM

## 2019-05-04 ENCOUNTER — Other Ambulatory Visit: Payer: Self-pay | Admitting: Family Medicine

## 2019-05-04 DIAGNOSIS — F418 Other specified anxiety disorders: Secondary | ICD-10-CM

## 2019-05-05 ENCOUNTER — Encounter: Payer: Self-pay | Admitting: Nurse Practitioner

## 2019-05-05 ENCOUNTER — Encounter: Payer: BC Managed Care – PPO | Admitting: Nurse Practitioner

## 2019-05-05 ENCOUNTER — Telehealth: Payer: Self-pay | Admitting: Family Medicine

## 2019-05-05 DIAGNOSIS — R197 Diarrhea, unspecified: Secondary | ICD-10-CM

## 2019-05-05 NOTE — Telephone Encounter (Signed)
Please write for her to return to work today. ( I left a note at the nurses desk station B  since I spoke with her after hours on Friday.).

## 2019-05-05 NOTE — Progress Notes (Signed)
Patient just needed a work note and Dr, Secundino Ginger already took care of it.

## 2019-05-05 NOTE — Telephone Encounter (Signed)
Letter ready for pick up

## 2019-06-03 ENCOUNTER — Other Ambulatory Visit: Payer: Self-pay | Admitting: Family Medicine

## 2019-06-03 ENCOUNTER — Ambulatory Visit (INDEPENDENT_AMBULATORY_CARE_PROVIDER_SITE_OTHER): Payer: BC Managed Care – PPO | Admitting: *Deleted

## 2019-06-03 ENCOUNTER — Ambulatory Visit (INDEPENDENT_AMBULATORY_CARE_PROVIDER_SITE_OTHER): Payer: BC Managed Care – PPO | Admitting: Family Medicine

## 2019-06-03 ENCOUNTER — Other Ambulatory Visit: Payer: Self-pay

## 2019-06-03 DIAGNOSIS — J3089 Other allergic rhinitis: Secondary | ICD-10-CM | POA: Diagnosis not present

## 2019-06-03 DIAGNOSIS — E538 Deficiency of other specified B group vitamins: Secondary | ICD-10-CM

## 2019-06-03 DIAGNOSIS — R2689 Other abnormalities of gait and mobility: Secondary | ICD-10-CM

## 2019-06-03 DIAGNOSIS — R51 Headache: Secondary | ICD-10-CM | POA: Diagnosis not present

## 2019-06-03 DIAGNOSIS — R002 Palpitations: Secondary | ICD-10-CM

## 2019-06-03 DIAGNOSIS — R519 Headache, unspecified: Secondary | ICD-10-CM

## 2019-06-03 MED ORDER — AZELASTINE HCL 0.1 % NA SOLN: 1.0000 | Freq: Two times a day (BID) | NASAL | 12 refills | Status: DC

## 2019-06-03 NOTE — Progress Notes (Signed)
Telephone visit  Subjective: CC: off balance PCP: [name], DO [id number] Mandy Vargas is a 58 y.o. female calls for telephone consult today. Patient provides verbal consent for consult held via phone.  Location of patient: home Location of provider: Working remotely from home Others present for call: none  1. Off balance Patient reports being off balance every day "for a while" (3+ months).  Denies dizziness, falls.  She feels like she wants to fall.  She reports daily headaches for months as well.  She reports sinus drainage that began in January.  She takes zyrtec and saline nasal spray.  She denies hearing changes, fevers, purulence from nares, foul taste in mouth.  Denies weakness, sensation change, visual disturbance.  She was treated in July with antibiotics for a bacterial sinusitis.  She continues to have quite a bit of anxiety and stress with relation to her mother's placement in a nursing facility.  She reports symptoms of guilt because she is unable to see her and the patient's mother is alone.  She reports compliance with B12 OTC and injections.  Has injection scheduled today.     ROS: Per HPI  Allergies  Allergen Reactions  . Amitriptyline     Tried 10mg  in past, made her feel "out of it"   Past Medical History:  Diagnosis Date  . Anemia   . 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 Bruises easily   . Depression   . Nephrolithiasis    Status post lithotripsy  . Palpitations   . Wears glasses     Current Outpatient Medications:  .  amoxicillin-clavulanate (AUGMENTIN) 875-125 MG tablet, Take 1 tablet by mouth 2 (two) times daily., Disp: 20 tablet, Rfl: 0 .  buPROPion (WELLBUTRIN XL) 300 MG 24 hr tablet, Take 1 tablet (300 mg total) by mouth daily., Disp: 90 tablet, Rfl: 0 .  busPIRone (BUSPAR) 10 MG tablet, TAKE 2 TABLETS (20 MG TOTAL) BY MOUTH 2 (TWO) TIMES DAILY. (Please make 3 mos appt), Disp: 360 tablet, Rfl: 0 .  diazepam (VALIUM) 2 MG tablet, Take 2 mg  by mouth at bedtime., Disp: , Rfl: 0 .  metoprolol tartrate (LOPRESSOR) 25 MG tablet, Take 1 tablet (25 mg total) by mouth 2 (two) times daily. (Needs to be seen before next refill), Disp: 60 tablet, Rfl: 0 .  Multiple Vitamin (MULTIVITAMIN) tablet, Take 1 tablet by mouth daily., Disp: , Rfl:  .  Red Yeast Rice Extract (RED YEAST RICE PO), Take by mouth., Disp: , Rfl:  .  sertraline (ZOLOFT) 100 MG tablet, TAKE 2 TABLETS BY MOUTH EVERY DAY, Disp: 180 tablet, Rfl: 1  Current Facility-Administered Medications:  .  cyanocobalamin ((VITAMIN B-12)) injection 1,000 mcg, 1,000 mcg, Intramuscular, Q30 days, Eustaquio Maize, MD, 1,000 mcg at 02/24/19 1508   Assessment/ Plan: 58 y.o. female   1. Balance problem Uncertain etiology.  Possibly inner ear issue/sinus mediated versus possibly intracranial.  She is not reporting any red flag neurologic signs or symptoms.  We will proceed with empiric treatment for sinusitis/allergies but if no significant improvement by her visit in October, low threshold to obtain MRI of the brain and refer back to her neurologist.  She understands reasons for evaluation emergency department.  2. Non-seasonal allergic rhinitis, unspecified trigger Trial of Astelin.  Continue Zyrtec.  She is status post completion of antibiotics in July for presumed sinus infection. - azelastine (ASTELIN) 0.1 % nasal spray; Place 1 spray into both nostrils 2 (two)  times daily.  Dispense: 30 mL; Refill: 12  3. Chronic daily headache Possibly related to ongoing stressors secondary to her mother's placement in nursing facility.  I again counseled her on seeing a psychiatrist versus therapist as she is on triple therapy for anxiety and depression but continues to have symptoms.  This also may be mediated by uncontrolled allergic rhinitis/sinusitis.  We discussed that if no significant improvement again low threshold for MRI of the brain and refer back to neurology.  She voiced good  understanding.   Start time: 1:00pm End time: 1:16pm  Total time spent on patient care (including telephone call/ virtual visit): 21 minutes  [place], [place] [phone]

## 2019-06-03 NOTE — Progress Notes (Signed)
Pt given Cyanocobalamin inj Pt tolerated well 

## 2019-06-06 ENCOUNTER — Telehealth: Payer: Self-pay | Admitting: Family Medicine

## 2019-06-06 NOTE — Telephone Encounter (Signed)
Patient experiencing pain underneath left arm, some breast pain.  No pain in chest or shortness of breath.  No available appointments today.  Advised patient to go to ER over weekend if any worsening signs or symptoms.  Appointment scheduled Monday 06/09/2019.

## 2019-06-09 ENCOUNTER — Ambulatory Visit: Payer: BC Managed Care – PPO | Admitting: Physician Assistant

## 2019-06-12 ENCOUNTER — Telehealth: Payer: Self-pay | Admitting: Family Medicine

## 2019-06-12 NOTE — Telephone Encounter (Signed)
Notes sent to [facility]

## 2019-06-13 ENCOUNTER — Other Ambulatory Visit: Payer: Self-pay | Admitting: Obstetrics and Gynecology

## 2019-06-13 DIAGNOSIS — N644 Mastodynia: Secondary | ICD-10-CM

## 2019-06-19 ENCOUNTER — Ambulatory Visit: Payer: BC Managed Care – PPO | Admitting: Family

## 2019-06-19 ENCOUNTER — Other Ambulatory Visit: Payer: Self-pay

## 2019-06-19 ENCOUNTER — Encounter: Payer: Self-pay | Admitting: Family

## 2019-06-19 VITALS — BP 97/57 | HR 66 | Temp 97.8°F | Ht 61.0 in | Wt 141.4 lb

## 2019-06-19 DIAGNOSIS — M940 Chondrocostal junction syndrome [Tietze]: Secondary | ICD-10-CM | POA: Diagnosis not present

## 2019-06-19 MED ORDER — PREDNISONE 10 MG (21) PO TBPK: ORAL_TABLET | ORAL | 0 refills | Status: DC

## 2019-06-19 MED ORDER — DICLOFENAC SODIUM 75 MG PO TBEC: 75.0000 mg | DELAYED_RELEASE_TABLET | Freq: Two times a day (BID) | ORAL | 0 refills | Status: DC

## 2019-06-19 NOTE — Patient Instructions (Signed)
Costochondritis  Costochondritis is swelling and irritation (inflammation) of the tissue (cartilage) that connects your ribs to your breastbone (sternum). This causes pain in the front of your chest. The pain usually starts gradually and involves more than one rib. What are the causes? The exact cause of this condition is not always known. It results from stress on the cartilage where your ribs attach to your sternum. The cause of this stress could be:  Chest injury (trauma).  Exercise or activity, such as lifting.  Severe coughing. What increases the risk? You may be at higher risk for this condition if you:  Are female.  Are 30?60 years old.  Recently started a new exercise or work activity.  Have low levels of vitamin D.  Have a condition that makes you cough frequently. What are the signs or symptoms? The main symptom of this condition is chest pain. The pain:  Usually starts gradually and can be sharp or dull.  Gets worse with deep breathing, coughing, or exercise.  Gets better with rest.  May be worse when you press on the sternum-rib connection (tenderness). How is this diagnosed? This condition is diagnosed based on your symptoms, medical history, and a physical exam. Your health care provider will check for tenderness when pressing on your sternum. This is the most important finding. You may also have tests to rule out other causes of chest pain. These may include:  A chest X-ray to check for lung problems.  An electrocardiogram (ECG) to see if you have a heart problem that could be causing the pain.  An imaging scan to rule out a chest or rib fracture. How is this treated? This condition usually goes away on its own over time. Your health care provider may prescribe an NSAID to reduce pain and inflammation. Your health care provider may also suggest that you:  Rest and avoid activities that make pain worse.  Apply heat or cold to the area to reduce pain and  inflammation.  Do exercises to stretch your chest muscles. If these treatments do not help, your health care provider may inject a numbing medicine at the sternum-rib connection to help relieve the pain. Follow these instructions at home:  Avoid activities that make pain worse. This includes any activities that use chest, abdominal, and side muscles.  If directed, put ice on the painful area: ? Put ice in a plastic bag. ? Place a towel between your skin and the bag. ? Leave the ice on for 20 minutes, 2-3 times a day.  If directed, apply heat to the affected area as often as told by your health care provider. Use the heat source that your health care provider recommends, such as a moist heat pack or a heating pad. ? Place a towel between your skin and the heat source. ? Leave the heat on for 20-30 minutes. ? Remove the heat if your skin turns bright red. This is especially important if you are unable to feel pain, heat, or cold. You may have a greater risk of getting burned.  Take over-the-counter and prescription medicines only as told by your health care provider.  Return to your normal activities as told by your health care provider. Ask your health care provider what activities are safe for you.  Keep all follow-up visits as told by your health care provider. This is important. Contact a health care provider if:  You have chills or a fever.  Your pain does not go away or it gets   worse.  You have a cough that does not go away (is persistent). Get help right away if:  You have shortness of breath. This information is not intended to replace advice given to you by your health care provider. Make sure you discuss any questions you have with your health care provider. Document Released: 06/07/2005 Document Revised: 09/12/2017 Document Reviewed: 12/22/2015 Elsevier Patient Education  2020 [employer].

## 2019-06-19 NOTE — Progress Notes (Signed)
   Subjective:    Patient ID: Mandy Vargas, female    DOB: June 18, 1961, 58 y.o.   MRN: [id number]  Chief Complaint  Patient presents with  . pain at sternum with deep breaths, began before March    HPI  Pt presents to the office today with chest pain, rib pain, and back that stared 10/2018 after picking up a 58 year old. She states the pain is intermittent and 5 out 10. She has taken tylenol with mild relief. She reports taking a deep breath makes the pain worse.   She is scheduled for a diagnostic mammogram on the 06/25/19. She had a Ct chest on 02/21/19 that showed a stable pleural nodule that has been unchanged for 3 years.   Review of Systems  All other systems reviewed and are negative.      Objective:   Physical Exam Vitals signs reviewed.  Constitutional:      General: She is not in acute distress.    Appearance: She is well-developed.  HENT:     Head: Normocephalic and atraumatic.     Right Ear: Tympanic membrane normal.     Left Ear: Tympanic membrane normal.  Eyes:     Pupils: Pupils are equal, round, and reactive to light.  Neck:     Musculoskeletal: Normal range of motion and neck supple.     Thyroid: No thyromegaly.  Cardiovascular:     Rate and Rhythm: Normal rate and regular rhythm.     Heart sounds: Normal heart sounds. No murmur.  Pulmonary:     Effort: Pulmonary effort is normal. No respiratory distress.     Breath sounds: Normal breath sounds. No wheezing.  Abdominal:     General: Bowel sounds are normal. There is no distension.     Palpations: Abdomen is soft.     Tenderness: There is no abdominal tenderness.  Musculoskeletal: Normal range of motion.        General: No tenderness.     Comments: Full ROM   Skin:    General: Skin is warm and dry.  Neurological:     Mental Status: She is alert and oriented to person, place, and time.     Cranial Nerves: No cranial nerve deficit.     Deep Tendon Reflexes: Reflexes are normal and symmetric.   Psychiatric:        Behavior: Behavior normal.        Thought Content: Thought content normal.        Judgment: Judgment normal.       BP (!) 97/57   Pulse 66   Temp 97.8 F (36.6 C) (Temporal)   Ht 5\' 1"  (1.549 m)   Wt 141 lb 6.4 oz (64.1 kg)   LMP 12/13/2011   SpO2 98%   BMI 26.72 kg/m      Assessment & Plan:  Mandy Vargas comes in today with chief complaint of pain at sternum with deep breaths, began before March   Diagnosis and orders addressed:  1. Costochondritis Rest Ice ROM exercises encouraged Keep follow up with PCP and mammogram Call us if pain continues or worsens  - diclofenac (VOLTAREN) 75 MG EC tablet; Take 1 tablet (75 mg total) by mouth 2 (two) times daily.  Dispense: 30 tablet; Refill: 0 - predniSONE (STERAPRED UNI-PAK 21 TAB) 10 MG (21) TBPK tablet; Use as directed  Dispense: 21 tablet; Refill: 0   Evelina Dun, FNP

## 2019-06-23 ENCOUNTER — Other Ambulatory Visit: Payer: BC Managed Care – PPO

## 2019-06-25 ENCOUNTER — Ambulatory Visit: Payer: BC Managed Care – PPO

## 2019-06-25 ENCOUNTER — Ambulatory Visit
Admission: RE | Admit: 2019-06-25 | Discharge: 2019-06-25 | Disposition: A | Discharge status: Home / Self Care | Payer: BC Managed Care – PPO | Source: Ambulatory Visit | Attending: Obstetrics and Gynecology | Admitting: Obstetrics and Gynecology

## 2019-06-25 ENCOUNTER — Other Ambulatory Visit: Payer: Self-pay

## 2019-06-25 ENCOUNTER — Other Ambulatory Visit: Payer: Self-pay | Admitting: Family Medicine

## 2019-06-25 DIAGNOSIS — N644 Mastodynia: Secondary | ICD-10-CM

## 2019-06-25 DIAGNOSIS — R002 Palpitations: Secondary | ICD-10-CM

## 2019-07-02 ENCOUNTER — Other Ambulatory Visit: Payer: Self-pay

## 2019-07-03 ENCOUNTER — Ambulatory Visit (INDEPENDENT_AMBULATORY_CARE_PROVIDER_SITE_OTHER): Payer: BC Managed Care – PPO

## 2019-07-03 DIAGNOSIS — E538 Deficiency of other specified B group vitamins: Secondary | ICD-10-CM

## 2019-07-03 NOTE — Progress Notes (Signed)
Cyanocobalamin injection given to left deltoid.  Patient tolerated well. 

## 2019-07-10 ENCOUNTER — Other Ambulatory Visit: Payer: Self-pay

## 2019-07-11 ENCOUNTER — Encounter: Payer: Self-pay | Admitting: Family Medicine

## 2019-07-11 ENCOUNTER — Telehealth: Payer: Self-pay | Admitting: Family Medicine

## 2019-07-11 ENCOUNTER — Ambulatory Visit: Payer: BC Managed Care – PPO | Admitting: Family Medicine

## 2019-07-11 VITALS — BP 108/67 | HR 78 | Temp 98.6°F | Ht 61.0 in | Wt 141.0 lb

## 2019-07-11 DIAGNOSIS — R911 Solitary pulmonary nodule: Secondary | ICD-10-CM

## 2019-07-11 DIAGNOSIS — R2689 Other abnormalities of gait and mobility: Secondary | ICD-10-CM | POA: Diagnosis not present

## 2019-07-11 DIAGNOSIS — R6889 Other general symptoms and signs: Secondary | ICD-10-CM

## 2019-07-11 MED ORDER — PANTOPRAZOLE SODIUM 40 MG PO TBEC: 40.0000 mg | DELAYED_RELEASE_TABLET | Freq: Every day | ORAL | 3 refills | Status: DC

## 2019-07-11 NOTE — Telephone Encounter (Signed)
note faxed

## 2019-07-11 NOTE — Progress Notes (Signed)
Subjective: CC: Balance concerns, sinus drainage, lung nodule PCP: [name], DO Mandy Vargas is a 58 y.o. female presenting to clinic today for:  1.  Balance concerns Patient with ongoing balance issues.  She notes that over the last 2 months her balance has been quite off.  She denies any associated dizziness or lightheadedness.  She describes an instance when she was ambulating down the stairs and had to catch herself because she was about to fall.  The balance is off every day.  She denies any other neurologic changes.  No visual disturbance.  She does describe sinus drainage that goes down into the chest is been ongoing since before March.  The drainage is refractory to Astelin and Claritin.  No fevers, chills, hemoptysis or shortness of breath.  She does have a substernal pain when she takes a deep breath then.  She also complains of bilateral shoulder pain with left greater than right.  She wonders if this is related to the lung nodule noted on recent CT scan.  She has a history of left-sided breast cancer.   ROS: Per HPI  Allergies  Allergen Reactions  . Amitriptyline     Tried '10mg'$  in past, made her feel "out of it"   Past Medical History:  Diagnosis Date  . Anemia   . 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 Bruises easily   . Depression   . Nephrolithiasis    Status post lithotripsy  . Palpitations   . Wears glasses     Current Outpatient Medications:  .  azelastine (ASTELIN) 0.1 % nasal spray, Place 1 spray into both nostrils 2 (two) times daily., Disp: 30 mL, Rfl: 12 .  buPROPion (WELLBUTRIN XL) 300 MG 24 hr tablet, Take 1 tablet (300 mg total) by mouth daily., Disp: 90 tablet, Rfl: 0 .  busPIRone (BUSPAR) 10 MG tablet, TAKE 2 TABLETS (20 MG TOTAL) BY MOUTH 2 (TWO) TIMES DAILY. (Please make 3 mos appt), Disp: 360 tablet, Rfl: 0 .  diazepam (VALIUM) 2 MG tablet, Take 2 mg by mouth at bedtime., Disp: , Rfl: 0 .  diclofenac (VOLTAREN) 75 MG EC tablet,  Take 1 tablet (75 mg total) by mouth 2 (two) times daily., Disp: 30 tablet, Rfl: 0 .  metoprolol tartrate (LOPRESSOR) 25 MG tablet, Take 1 tablet (25 mg total) by mouth 2 (two) times daily., Disp: 60 tablet, Rfl: 0 .  Multiple Vitamin (MULTIVITAMIN) tablet, Take 1 tablet by mouth daily., Disp: , Rfl:  .  predniSONE (STERAPRED UNI-PAK 21 TAB) 10 MG (21) TBPK tablet, Use as directed, Disp: 21 tablet, Rfl: 0 .  Red Yeast Rice Extract (RED YEAST RICE PO), Take by mouth., Disp: , Rfl:  .  sertraline (ZOLOFT) 100 MG tablet, TAKE 2 TABLETS BY MOUTH EVERY DAY, Disp: 180 tablet, Rfl: 1  Current Facility-Administered Medications:  .  cyanocobalamin ((VITAMIN B-12)) injection 1,000 mcg, 1,000 mcg, Intramuscular, Q30 days, Eustaquio Maize, MD, 1,000 mcg at 07/03/19 1623 Social History   Socioeconomic History  . Marital status: Married    Spouse name: Not on file  . Number of children: Not on file  . Years of education: Not on file  . Highest education level: Not on file  Occupational History  . Not on file  Social Needs  . Financial resource strain: Not on file  . Food insecurity    Worry: Not on file    Inability: Not on file  . Transportation  needs    Medical: Not on file    Non-medical: Not on file  Tobacco Use  . Smoking status: Former Smoker    Packs/day: 0.25    Years: 1.00    Pack years: 0.25    Types: Cigarettes    Quit date: 09/12/2011    Years since quitting: 7.8  . Smokeless tobacco: Never Used  Substance and Sexual Activity  . Alcohol use: No    Alcohol/week: 0.0 standard drinks  . Drug use: No  . Sexual activity: Yes    Birth control/protection: Post-menopausal  Lifestyle  . Physical activity    Days per week: Not on file    Minutes per session: Not on file  . Stress: Not on file  Relationships  . Social Herbalist on phone: Not on file    Gets together: Not on file    Attends religious service: Not on file    Active member of club or organization: Not on  file    Attends meetings of clubs or organizations: Not on file    Relationship status: Not on file  . Intimate partner violence    Fear of current or ex partner: Not on file    Emotionally abused: Not on file    Physically abused: Not on file    Forced sexual activity: Not on file  Other Topics Concern  . 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 Colon cancer Mother   . Alzheimer's disease Mother   . Depression Mother   . Breast cancer Sister   . Cancer Sister 50       Breast  . Bipolar disorder Sister   . Heart attack Father 45       Deceased  . Cancer Maternal Aunt        Breast  . Colon cancer Paternal Grandmother        93'S  . Healthy Son   . Anesthesia problems Neg Hx     Objective: Office vital signs reviewed. BP 108/67   Pulse 78   Temp 98.6 F (37 C) (Temporal)   Ht '5\' 1"'$  (1.549 m)   Wt 141 lb (64 kg)   LMP 12/13/2011   SpO2 98%   BMI 26.64 kg/m   Physical Examination:  General: Awake, alert, well nourished, No acute distress HEENT: Normal; PERRLA, EOMI, TMs intact bilaterally with good reflex.  No bulging or abnormality noted.  Oropharynx with cobblestone appearance Cardio: regular rate and rhythm, S1S2 heard, no murmurs appreciated Pulm: clear to auscultation bilaterally, no wheezes, rhonchi or rales; normal work of breathing on room air Extremities: warm, well perfused, No edema, cyanosis or clubbing; +2 pulses bilaterally MSK: normal gait and station Neuro: Cranial nerves II through XII grossly intact.  Upper extremity cerebellar testing and lower extremity cerebellar testing were normal.  Negative Romberg.  Assessment/ Plan: 58 y.o. female   1. Lung nodule We reviewed the CT scan together during today's visit as well as the report.  We are currently monitoring a right upper lobe nodule of 7 mm in size with  groundglass appearance.  She would like to have formal evaluation by pulmonology given sensation chest and her history of breast cancer.  Referral has been placed. - Ambulatory referral to Pulmonology - CBC with Differential  2. Balance problem Uncertain etiology.  Her neurologic exam was unremarkable today.  I have placed a referral to ENT given reports of ongoing drainage refractory to antihistamines and nasal spray.  Perhaps this is impacting her inner ear.  May need to consider referral back to her neurologist as well at some point.  This was discussed during today's visit.  She is in agreement of the plan. - Ambulatory referral to ENT - CBC with Differential - CMP14+EGFR  3. Congestion of throat We will trial on PPI given ongoing chest congestion.  Her oropharynx was notable for cobblestone appearance today. - Ambulatory referral to ENT - pantoprazole (PROTONIX) 40 MG tablet; Take 1 tablet (40 mg total) by mouth daily.  Dispense: 30 tablet; Refill: 3 - CBC with Differential   No orders of the defined types were placed in this encounter.  No orders of the defined types were placed in this encounter.    [name], DO [place] [phone] '

## 2019-07-12 LAB — CMP14+EGFR
ALT: 31 IU/L (ref 0–32)
AST: 30 IU/L (ref 0–40)
Albumin/Globulin Ratio: 1.6 (ref 1.2–2.2)
Albumin: 4.2 g/dL (ref 3.8–4.9)
Alkaline Phosphatase: 81 IU/L (ref 39–117)
BUN/Creatinine Ratio: 29 — ABNORMAL HIGH (ref 12–28)
BUN: 27 mg/dL (ref 8–27)
Bilirubin Total: 0.2 mg/dL (ref 0.0–1.2)
CO2: 24 mmol/L (ref 20–29)
Calcium: 9.1 mg/dL (ref 8.7–10.3)
Chloride: 104 mmol/L (ref 96–106)
Creatinine, Ser: 0.93 mg/dL (ref 0.57–1.00)
GFR calc Af Amer: 77 mL/min/{1.73_m2} (ref >59)
GFR calc non Af Amer: 67 mL/min/{1.73_m2} (ref >59)
Globulin, Total: 2.6 g/dL (ref 1.5–4.5)
Glucose: 100 mg/dL — ABNORMAL HIGH (ref 65–99)
Potassium: 4.6 mmol/L (ref 3.5–5.2)
Sodium: 140 mmol/L (ref 134–144)
Total Protein: 6.8 g/dL (ref 6.0–8.5)

## 2019-07-12 LAB — CBC WITH DIFFERENTIAL/PLATELET
Basophils Absolute: 0 10*3/uL (ref 0.0–0.2)
Basos: 1 %
EOS (ABSOLUTE): 0.1 10*3/uL (ref 0.0–0.4)
Eos: 2 %
Hematocrit: 37.4 % (ref 34.0–46.6)
Hemoglobin: 12.4 g/dL (ref 11.1–15.9)
Immature Grans (Abs): 0 10*3/uL (ref 0.0–0.1)
Immature Granulocytes: 0 %
Lymphocytes Absolute: 1.3 10*3/uL (ref 0.7–3.1)
Lymphs: 26 %
MCH: 28.9 pg (ref 26.6–33.0)
MCHC: 33.2 g/dL (ref 31.5–35.7)
MCV: 87 fL (ref 79–97)
Monocytes Absolute: 0.4 10*3/uL (ref 0.1–0.9)
Monocytes: 7 %
Neutrophils Absolute: 3.1 10*3/uL (ref 1.4–7.0)
Neutrophils: 64 %
Platelets: 164 10*3/uL (ref 150–450)
RBC: 4.29 x10E6/uL (ref 3.77–5.28)
RDW: 13.4 % (ref 11.7–15.4)
WBC: 4.9 10*3/uL (ref 3.4–10.8)

## 2019-07-15 ENCOUNTER — Telehealth: Payer: Self-pay | Admitting: Family Medicine

## 2019-07-15 NOTE — Telephone Encounter (Signed)
Patient aware and verbalized understanding. °

## 2019-07-22 ENCOUNTER — Other Ambulatory Visit: Payer: Self-pay | Admitting: Family Medicine

## 2019-07-22 DIAGNOSIS — R002 Palpitations: Secondary | ICD-10-CM

## 2019-07-24 ENCOUNTER — Institutional Professional Consult (permissible substitution): Payer: BC Managed Care – PPO | Admitting: Pulmonary Disease

## 2019-07-30 ENCOUNTER — Ambulatory Visit: Payer: BC Managed Care – PPO | Admitting: Pulmonary Disease

## 2019-07-30 ENCOUNTER — Encounter: Payer: Self-pay | Admitting: Pulmonary Disease

## 2019-07-30 ENCOUNTER — Other Ambulatory Visit: Payer: Self-pay

## 2019-07-30 VITALS — BP 118/82 | HR 89 | Temp 98.3°F | Ht 61.0 in | Wt 143.6 lb

## 2019-07-30 DIAGNOSIS — R918 Other nonspecific abnormal finding of lung field: Secondary | ICD-10-CM | POA: Diagnosis not present

## 2019-07-30 DIAGNOSIS — R911 Solitary pulmonary nodule: Secondary | ICD-10-CM | POA: Diagnosis not present

## 2019-07-30 DIAGNOSIS — Z87891 Personal history of nicotine dependence: Secondary | ICD-10-CM | POA: Diagnosis not present

## 2019-07-30 DIAGNOSIS — R079 Chest pain, unspecified: Secondary | ICD-10-CM

## 2019-07-30 NOTE — Progress Notes (Signed)
Synopsis: Referred in Nov 2020 for lung nodule by [name], DO  Subjective:   PATIENT ID: Mandy Vargas GENDER: female DOB: March 11, 1961, MRN: [id number]  Chief Complaint  Patient presents with  . Consult    Lung Nodule. She reports pain in the middle of her chest since before march. 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     PMH breast cancer, lumpectomy, low stage s/p resection, depression, anxiety, mother in nursing home and making this worse. Former smoker, started smoking teenager 14, highest pack per day was 1 per day, the past two years 2-3 cigarettes per day before quitting 2 years go.  She had a CT scan of the chest in June 2020 which revealed a stable right lower lobe nodule as well as a new right upper lobe groundglass subsolid nodule 7 mm in size.  She does complain of vague chest pains.  And occasional chest discomfort either at rest or sometimes with exertion.  Her CT scan also revealed calcifications within the coronary arteries for scattered coronary disease.  Of note she also has reflux symptoms and she is currently on medications for this.  Her chest pains seem to be more musculoskeletal as she is able to point to the exact location of where these pains are appearing.   Past Medical History:  Diagnosis Date  . Anemia   . 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 Bruises easily   . Depression   . Nephrolithiasis    Status post lithotripsy  . Palpitations   . Wears glasses      Family History  Problem Relation Age of Onset  . Colon cancer Mother   . Alzheimer's disease Mother   . Depression Mother   . Breast cancer Sister   . Cancer Sister 31       Breast  . Bipolar disorder Sister   . Heart attack Father 31       Deceased  . Cancer Maternal Aunt        Breast  . Colon cancer Paternal Grandmother        3'S  . Healthy Son   . Anesthesia problems Neg Hx      Past Surgical History:  Procedure Laterality Date  .  BREAST BIOPSY  12/22/11   DCIS w/calcifications  . BREAST LUMPECTOMY Left 02/19/12   Left breast, DCIS, ER/PR+  . DILATION AND CURETTAGE OF UTERUS  1993  . KIDNEY STONE SURGERY      Social History   Socioeconomic History  . Marital status: Married    Spouse name: Not on file  . Number of children: Not on file  . Years of education: Not on file  . Highest education level: Not on file  Occupational History  . Not on file  Social Needs  . Financial resource strain: Not on file  . Food insecurity    Worry: Not on file    Inability: Not on file  . Transportation needs    Medical: Not on file    Non-medical: Not on file  Tobacco Use  . Smoking status: Former Smoker    Packs/day: 0.25    Years: 1.00    Pack years: 0.25    Types: Cigarettes    Quit date: 09/12/2011    Years since quitting: 7.8  . Smokeless tobacco: Never Used  Substance and Sexual Activity  . Alcohol use: No    Alcohol/week: 0.0  standard drinks  . Drug use: No  . Sexual activity: Yes    Birth control/protection: Post-menopausal  Lifestyle  . Physical activity    Days per week: Not on file    Minutes per session: Not on file  . Stress: Not on file  Relationships  . Social Herbalist on phone: Not on file    Gets together: Not on file    Attends religious service: Not on file    Active member of club or organization: Not on file    Attends meetings of clubs or organizations: Not on file    Relationship status: Not on file  . Intimate partner violence    Fear of current or ex partner: Not on file    Emotionally abused: Not on file    Physically abused: Not on file    Forced sexual activity: Not on file  Other Topics Concern  . 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 Amitriptyline     Tried 10mg  in past, made her feel "out of it"     Outpatient Medications  Prior to Visit  Medication Sig Dispense Refill  . azelastine (ASTELIN) 0.1 % nasal spray Place 1 spray into both nostrils 2 (two) times daily. 30 mL 12  . buPROPion (WELLBUTRIN XL) 300 MG 24 hr tablet Take 1 tablet (300 mg total) by mouth daily. 90 tablet 0  . busPIRone (BUSPAR) 10 MG tablet TAKE 2 TABLETS (20 MG TOTAL) BY MOUTH 2 (TWO) TIMES DAILY. (Please make 3 mos appt) 360 tablet 0  . diazepam (VALIUM) 2 MG tablet Take 2 mg by mouth at bedtime.  0  . metoprolol tartrate (LOPRESSOR) 25 MG tablet TAKE 1 TABLET BY MOUTH TWICE A DAY 60 tablet 2  . Multiple Vitamin (MULTIVITAMIN) tablet Take 1 tablet by mouth daily.    . Red Yeast Rice Extract (RED YEAST RICE PO) Take by mouth.    . sertraline (ZOLOFT) 100 MG tablet TAKE 2 TABLETS BY MOUTH EVERY DAY 180 tablet 1  . pantoprazole (PROTONIX) 40 MG tablet Take 1 tablet (40 mg total) by mouth daily. (Patient not taking: Reported on 07/30/2019) 30 tablet 3   No facility-administered medications prior to visit.     Review of Systems  Constitutional: Negative for chills, fever, malaise/fatigue and weight loss.  HENT: Negative for hearing loss, sore throat and tinnitus.   Eyes: Negative for blurred vision and double vision.  Respiratory: Negative for cough, hemoptysis, sputum production, shortness of breath, wheezing and stridor.   Cardiovascular: Positive for chest pain. Negative for palpitations, orthopnea, leg swelling and PND.  Gastrointestinal: Negative for abdominal pain, constipation, diarrhea, heartburn, nausea and vomiting.  Genitourinary: Negative for dysuria, hematuria and urgency.  Musculoskeletal: Negative for joint pain and myalgias.  Skin: Negative for itching and rash.  Neurological: Negative for dizziness, tingling, weakness and headaches.  Endo/Heme/Allergies: Negative for environmental allergies. Does not bruise/bleed easily.  Psychiatric/Behavioral: Negative for depression. The patient is not nervous/anxious and does not have  insomnia.   All other systems reviewed and are negative.    Objective:  Physical Exam Vitals signs reviewed.  Constitutional:      General: She is not in acute distress.    Appearance: She is well-developed.  HENT:     Head: Normocephalic and atraumatic.  Eyes:     General:  No scleral icterus.    Conjunctiva/sclera: Conjunctivae normal.     Pupils: Pupils are equal, round, and reactive to light.  Neck:     Musculoskeletal: Neck supple.     Vascular: No JVD.     Trachea: No tracheal deviation.  Cardiovascular:     Rate and Rhythm: Normal rate and regular rhythm.     Heart sounds: Normal heart sounds. No murmur.  Pulmonary:     Effort: Pulmonary effort is normal. No tachypnea, accessory muscle usage or respiratory distress.     Breath sounds: Normal breath sounds. No stridor. No wheezing, rhonchi or rales.  Abdominal:     General: Bowel sounds are normal. There is no distension.     Palpations: Abdomen is soft.     Tenderness: There is no abdominal tenderness.  Musculoskeletal:        General: No tenderness.  Lymphadenopathy:     Cervical: No cervical adenopathy.  Skin:    General: Skin is warm and dry.     Capillary Refill: Capillary refill takes less than 2 seconds.     Findings: No rash.  Neurological:     Mental Status: She is alert and oriented to person, place, and time.  Psychiatric:        Behavior: Behavior normal.      Vitals:   07/30/19 1201  BP: 118/82  Pulse: 89  Temp: 98.3 F (36.8 C)  TempSrc: Temporal  SpO2: 97%  Weight: 143 lb 9.6 oz (65.1 kg)  Height: 5\' 1"  (1.549 m)   97% on RA BMI Readings from Last 3 Encounters:  07/30/19 27.13 kg/m  07/11/19 26.64 kg/m  06/19/19 26.72 kg/m   Wt Readings from Last 3 Encounters:  07/30/19 143 lb 9.6 oz (65.1 kg)  07/11/19 141 lb (64 kg)  06/19/19 141 lb 6.4 oz (64.1 kg)     CBC    Component Value Date/Time   WBC 4.9 07/11/2019 0835   WBC 8.3 12/17/2015 1145   RBC 4.29 07/11/2019 0835    RBC 3.75 (L) 12/17/2015 1145   HGB 12.4 07/11/2019 0835   HGB 12.5 06/07/2015 1432   HCT 37.4 07/11/2019 0835   HCT 37.6 06/07/2015 1432   PLT 164 07/11/2019 0835   MCV 87 07/11/2019 0835   MCV 90.3 06/07/2015 1432   MCH 28.9 07/11/2019 0835   MCH 29.9 12/17/2015 1145   MCHC 33.2 07/11/2019 0835   MCHC 32.3 12/17/2015 1145   RDW 13.4 07/11/2019 0835   RDW 12.9 06/07/2015 1432   LYMPHSABS 1.3 07/11/2019 0835   LYMPHSABS 1.8 06/07/2015 1432   MONOABS 0.4 12/17/2015 1145   MONOABS 0.5 06/07/2015 1432   EOSABS 0.1 07/11/2019 0835   BASOSABS 0.0 07/11/2019 0835   BASOSABS 0.0 06/07/2015 1432     Chest Imaging: CT scan of the chest June 2020:  Right upper lobe groundglass nodule, stable lower lobe nodule The patient's images have been independently reviewed by me.    Pulmonary Functions Testing Results: No flowsheet data found.  FeNO: None   Pathology: None   Echocardiogram: None   Heart Catheterization: None     Assessment & Plan:     ICD-10-CM   1. Ground glass opacity present on imaging of lung  R91.8   2. Abnormal findings on diagnostic imaging of lung  R91.8 CT CHEST WO CONTRAST  3. Lung nodule  R91.1   4. Former smoker  Z87.891 FULL - Pulmonary Function Test (LBPU)  5. Chest pain, unspecified type  R07.9     Discussion:  This is a 57 year old former smoker with a CT scan image with a subsolid groundglass nodule within the right upper lobe 7 mm in size, stable lower lobe nodule.  Also complains of rather nondescript chest pains.  She is able to point to these across the chest and side.  She did have atherosclerotic calcifications and coronary artery disease seen on CT imaging. Plan: Follow-up with primary care regarding chest pains.  I will send a message to her regarding this.  Clinically this seems to be musculoskeletal in nature however she does have CT evidence of atherosclerotic calcifications. As for the lung nodule I think this needs to be followed up a  little sooner and we will have a noncontrasted CT of the chest to be completed in January 2021. We will also have pulmonary function test completed at that time.  If this does happen to be a start of a stage I lung cancer we will need to have pulmonary function test to decide whether or not she would be a candidate for surgical resection.  Also help whether or not she has diagnosis of COPD as she has have a significant smoking history.  Patient to follow-up in our clinic in January 2021.  Greater than 50% of this patient's 45-minute office was spent face-to-face discussing above recommendations and treatment plan.   Current Outpatient Medications:  .  azelastine (ASTELIN) 0.1 % nasal spray, Place 1 spray into both nostrils 2 (two) times daily., Disp: 30 mL, Rfl: 12 .  buPROPion (WELLBUTRIN XL) 300 MG 24 hr tablet, Take 1 tablet (300 mg total) by mouth daily., Disp: 90 tablet, Rfl: 0 .  busPIRone (BUSPAR) 10 MG tablet, TAKE 2 TABLETS (20 MG TOTAL) BY MOUTH 2 (TWO) TIMES DAILY. (Please make 3 mos appt), Disp: 360 tablet, Rfl: 0 .  diazepam (VALIUM) 2 MG tablet, Take 2 mg by mouth at bedtime., Disp: , Rfl: 0 .  metoprolol tartrate (LOPRESSOR) 25 MG tablet, TAKE 1 TABLET BY MOUTH TWICE A DAY, Disp: 60 tablet, Rfl: 2 .  Multiple Vitamin (MULTIVITAMIN) tablet, Take 1 tablet by mouth daily., Disp: , Rfl:  .  Red Yeast Rice Extract (RED YEAST RICE PO), Take by mouth., Disp: , Rfl:  .  sertraline (ZOLOFT) 100 MG tablet, TAKE 2 TABLETS BY MOUTH EVERY DAY, Disp: 180 tablet, Rfl: 1   Garner Nash, DO [facility] 07/30/2019 12:18 PM

## 2019-07-30 NOTE — Patient Instructions (Addendum)
Thank you for visiting Dr. Valeta Harms at [facility]. Today we recommend the following:  Orders Placed This Encounter  Procedures  . CT CHEST WO CONTRAST  . FULL - Pulmonary Function Test (LBPU)    We have CT completed in Jan 2021. This will be about 6 months from the previous.   May need to consider cardiac evaluation with CAD on recent CT. I will message Dr. Lajuana Ripple, Koleen Distance, DO   Return in about 8 weeks (around 09/24/2019) for with APP or Dr. Valeta Harms.     Please do your part to reduce the spread of COVID-19.

## 2019-07-31 ENCOUNTER — Ambulatory Visit (INDEPENDENT_AMBULATORY_CARE_PROVIDER_SITE_OTHER): Payer: BC Managed Care – PPO

## 2019-07-31 ENCOUNTER — Telehealth: Payer: Self-pay | Admitting: Family Medicine

## 2019-07-31 DIAGNOSIS — E538 Deficiency of other specified B group vitamins: Secondary | ICD-10-CM

## 2019-07-31 MED ORDER — CYANOCOBALAMIN 1000 MCG/ML IJ SOLN
1000.0000 ug | INTRAMUSCULAR | Status: AC
  Administered 2019-07-31 – 2021-01-26 (×15): 1000 ug via INTRAMUSCULAR

## 2019-07-31 NOTE — Telephone Encounter (Signed)
Patient aware Dr.G is out of the office and will address when she returns.  Please review and advise and send back to pools.

## 2019-07-31 NOTE — Progress Notes (Signed)
Cyanocobalamin injection given to right deltoid.  Patient tolerated well. 

## 2019-08-01 ENCOUNTER — Other Ambulatory Visit: Payer: Self-pay | Admitting: Family Medicine

## 2019-08-01 DIAGNOSIS — R519 Headache, unspecified: Secondary | ICD-10-CM

## 2019-08-01 NOTE — Telephone Encounter (Signed)
Yes.  I will place referral.

## 2019-08-04 ENCOUNTER — Telehealth: Payer: Self-pay | Admitting: Family Medicine

## 2019-08-04 NOTE — Telephone Encounter (Signed)
Aware.  Doctor prefers her to try ENT for evaluation of headaches.

## 2019-08-05 ENCOUNTER — Telehealth: Payer: Self-pay | Admitting: Pulmonary Disease

## 2019-08-05 NOTE — Telephone Encounter (Signed)
Spoke with pt, she needs a note for work from the office visit on 07/30/2019 with Dr. Valeta Harms. I printed letter and pt request that we mail it to her. Placed in the mail and nothing further is needed.

## 2019-08-06 ENCOUNTER — Institutional Professional Consult (permissible substitution): Payer: BC Managed Care – PPO | Admitting: Pulmonary Disease

## 2019-08-11 ENCOUNTER — Ambulatory Visit (INDEPENDENT_AMBULATORY_CARE_PROVIDER_SITE_OTHER): Payer: BC Managed Care – PPO | Admitting: Otolaryngology

## 2019-08-11 ENCOUNTER — Other Ambulatory Visit: Payer: Self-pay

## 2019-08-11 DIAGNOSIS — H903 Sensorineural hearing loss, bilateral: Secondary | ICD-10-CM

## 2019-08-11 DIAGNOSIS — R0982 Postnasal drip: Secondary | ICD-10-CM | POA: Diagnosis not present

## 2019-08-11 DIAGNOSIS — R42 Dizziness and giddiness: Secondary | ICD-10-CM

## 2019-08-14 ENCOUNTER — Telehealth: Payer: Self-pay | Admitting: Family Medicine

## 2019-08-14 ENCOUNTER — Encounter: Payer: Self-pay | Admitting: Family Medicine

## 2019-08-14 ENCOUNTER — Ambulatory Visit (INDEPENDENT_AMBULATORY_CARE_PROVIDER_SITE_OTHER): Payer: BC Managed Care – PPO | Admitting: Family Medicine

## 2019-08-14 DIAGNOSIS — J029 Acute pharyngitis, unspecified: Secondary | ICD-10-CM

## 2019-08-14 NOTE — Progress Notes (Signed)
Virtual Visit via telephone Note Due to COVID-19 pandemic this visit was conducted virtually. This visit type was conducted due to national recommendations for restrictions regarding the COVID-19 Pandemic (e.g. social distancing, sheltering in place) in an effort to limit this patient's exposure and mitigate transmission in our community. All issues noted in this document were discussed and addressed.  A physical exam was not performed with this format.   I connected with Mandy Vargas on 08/14/2019 at 1330 by telephone and verified that I am speaking with the correct person using two identifiers. Mandy Vargas is currently located at home and no one is currently with them during visit. The provider, Monia Pouch, FNP is located in their office at time of visit.  I discussed the limitations, risks, security and privacy concerns of performing an evaluation and management service by telephone and the availability of in person appointments. I also discussed with the patient that there may be a patient responsible charge related to this service. The patient expressed understanding and agreed to proceed.  Subjective:  Patient ID: Mandy Vargas, female    DOB: Oct 27, 1960, 58 y.o.   MRN: [id number]  Chief Complaint:  Sore Throat   HPI: Mandy Vargas is a 58 y.o. female presenting on 08/14/2019 for Sore Throat   Pt states she woke up with a scratchy throat this morning. States she had a cough drop and symptoms resolved. No other symptoms. No known sick exposures.   Sore Throat  This is a new problem. The current episode started today. The problem has been resolved. Neither side of throat is experiencing more pain than the other. There has been no fever. The pain is at a severity of 0/10. The patient is experiencing no pain. Pertinent negatives include no abdominal pain, congestion, coughing, diarrhea, drooling, ear discharge, ear pain, headaches, hoarse voice, plugged ear sensation, neck pain,  shortness of breath, stridor, swollen glands, trouble swallowing or vomiting. Treatments tried: cough drop. The treatment provided significant relief.     Relevant past medical, surgical, family, and social history reviewed and updated as indicated.  Allergies and medications reviewed and updated.   Past Medical History:  Diagnosis Date  . Anemia   . 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 Bruises easily   . Depression   . Nephrolithiasis    Status post lithotripsy  . Palpitations   . Wears glasses     Past Surgical History:  Procedure Laterality Date  . BREAST BIOPSY  12/22/11   DCIS w/calcifications  . BREAST LUMPECTOMY Left 02/19/12   Left breast, DCIS, ER/PR+  . DILATION AND CURETTAGE OF UTERUS  1993  . KIDNEY STONE SURGERY      Social History   Socioeconomic History  . Marital status: Married    Spouse name: Not on file  . Number of children: Not on file  . Years of education: Not on file  . Highest education level: Not on file  Occupational History  . Not on file  Social Needs  . Financial resource strain: Not on file  . Food insecurity    Worry: Not on file    Inability: Not on file  . Transportation needs    Medical: Not on file    Non-medical: Not on file  Tobacco Use  . Smoking status: Former Smoker    Packs/day: 0.25    Years: 1.00    Pack years: 0.25    Types: Cigarettes  Quit date: 09/12/2011    Years since quitting: 7.9  . Smokeless tobacco: Never Used  Substance and Sexual Activity  . Alcohol use: No    Alcohol/week: 0.0 standard drinks  . Drug use: No  . Sexual activity: Yes    Birth control/protection: Post-menopausal  Lifestyle  . Physical activity    Days per week: Not on file    Minutes per session: Not on file  . Stress: Not on file  Relationships  . Social Herbalist on phone: Not on file    Gets together: Not on file    Attends religious service: Not on file    Active member of club or organization: Not on file     Attends meetings of clubs or organizations: Not on file    Relationship status: Not on file  . Intimate partner violence    Fear of current or ex partner: Not on file    Emotionally abused: Not on file    Physically abused: Not on file    Forced sexual activity: Not on file  Other Topics Concern  . 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 azelastine (ASTELIN) 0.1 % nasal spray Place 1 spray into both nostrils 2 (two) times daily.  Marland Kitchen buPROPion (WELLBUTRIN XL) 300 MG 24 hr tablet Take 1 tablet (300 mg total) by mouth daily.  . busPIRone (BUSPAR) 10 MG tablet TAKE 2 TABLETS (20 MG TOTAL) BY MOUTH 2 (TWO) TIMES DAILY. (Please make 3 mos appt)  . diazepam (VALIUM) 2 MG tablet Take 2 mg by mouth at bedtime.  . metoprolol tartrate (LOPRESSOR) 25 MG tablet TAKE 1 TABLET BY MOUTH TWICE A DAY  . Multiple Vitamin (MULTIVITAMIN) tablet Take 1 tablet by mouth daily.  . Red Yeast Rice Extract (RED YEAST RICE PO) Take by mouth.  . sertraline (ZOLOFT) 100 MG tablet TAKE 2 TABLETS BY MOUTH EVERY DAY   Facility-Administered Encounter Medications as of 08/14/2019  Medication  . cyanocobalamin ((VITAMIN B-12)) injection 1,000 mcg    Allergies  Allergen Reactions  . Amitriptyline     Tried 10mg  in past, made her feel "out of it"    Review of Systems  Constitutional: Negative for activity change, appetite change, chills, diaphoresis, fatigue, fever and unexpected weight change.  HENT: Negative.  Negative for congestion, drooling, ear discharge, ear pain, hoarse voice, sore throat, trouble swallowing and voice change.   Eyes: Negative.   Respiratory: Negative for cough, chest tightness, shortness of breath and stridor.   Cardiovascular: Negative for chest pain, palpitations and leg swelling.  Gastrointestinal: Negative for abdominal  pain, blood in stool, constipation, diarrhea, nausea and vomiting.  Endocrine: Negative.   Genitourinary: Negative for decreased urine volume, difficulty urinating, dysuria, frequency and urgency.  Musculoskeletal: Negative for arthralgias, myalgias and neck pain.  Skin: Negative.   Allergic/Immunologic: Negative.   Neurological: Negative for dizziness, weakness and headaches.  Hematological: Negative.   Psychiatric/Behavioral: Negative for confusion, hallucinations, sleep disturbance and suicidal ideas.  All other systems reviewed and are negative.        Observations/Objective: No vital signs or physical exam, this was a telephone or virtual health encounter.  Pt alert and oriented, answers all questions appropriately, and able to speak in full sentences.    Assessment and Plan: Tonye was seen today for sore  throat.  Diagnoses and all orders for this visit:  Viral pharyngitis Onset and resolution of symptoms within several hours. Likely caused by dry air, sleeping with mouth open, or viral infection. Symptomatic care discussed. No red flags concerning for acute bacterial infection. Pt aware to report return of symptoms, new or worsening symptoms. Follow up as needed.     Follow Up Instructions: Return if symptoms worsen or fail to improve.    I discussed the assessment and treatment plan with the patient. The patient was provided an opportunity to ask questions and all were answered. The patient agreed with the plan and demonstrated an understanding of the instructions.   The patient was advised to call back or seek an in-person evaluation if the symptoms worsen or if the condition fails to improve as anticipated.  The above assessment and management plan was discussed with the patient. The patient verbalized understanding of and has agreed to the management plan. Patient is aware to call the clinic if they develop any new symptoms or if symptoms persist or worsen. Patient is  aware when to return to the clinic for a follow-up visit. Patient educated on when it is appropriate to go to the emergency department.    I provided 15 minutes of non-face-to-face time during this encounter. The call started at 1330. The call ended at 1345. The other time was used for coordination of care.    Monia Pouch, FNP-C [facility] [address] [place], [place] [address] [phone] 08/14/2019

## 2019-08-27 ENCOUNTER — Other Ambulatory Visit: Payer: Self-pay | Admitting: Family Medicine

## 2019-08-27 DIAGNOSIS — F321 Major depressive disorder, single episode, moderate: Secondary | ICD-10-CM

## 2019-08-27 DIAGNOSIS — F418 Other specified anxiety disorders: Secondary | ICD-10-CM

## 2019-08-29 ENCOUNTER — Telehealth: Payer: Self-pay | Admitting: Family Medicine

## 2019-08-29 ENCOUNTER — Other Ambulatory Visit: Payer: Self-pay

## 2019-09-01 ENCOUNTER — Other Ambulatory Visit: Payer: Self-pay

## 2019-09-01 ENCOUNTER — Ambulatory Visit (INDEPENDENT_AMBULATORY_CARE_PROVIDER_SITE_OTHER): Payer: BC Managed Care – PPO

## 2019-09-01 DIAGNOSIS — E538 Deficiency of other specified B group vitamins: Secondary | ICD-10-CM | POA: Diagnosis not present

## 2019-09-01 NOTE — Progress Notes (Signed)
Cyanocobalamin injection given to left deltoid.  Patient tolerated well. 

## 2019-09-02 ENCOUNTER — Other Ambulatory Visit: Payer: Self-pay | Admitting: Family Medicine

## 2019-09-02 DIAGNOSIS — F418 Other specified anxiety disorders: Secondary | ICD-10-CM

## 2019-09-16 ENCOUNTER — Inpatient Hospital Stay: Admission: RE | Admit: 2019-09-16 | Payer: BC Managed Care – PPO | Source: Ambulatory Visit

## 2019-09-20 ENCOUNTER — Other Ambulatory Visit: Payer: Self-pay | Admitting: Family Medicine

## 2019-09-20 DIAGNOSIS — F418 Other specified anxiety disorders: Secondary | ICD-10-CM

## 2019-09-20 DIAGNOSIS — F321 Major depressive disorder, single episode, moderate: Secondary | ICD-10-CM

## 2019-09-24 ENCOUNTER — Ambulatory Visit (INDEPENDENT_AMBULATORY_CARE_PROVIDER_SITE_OTHER): Payer: BC Managed Care – PPO | Admitting: Family Medicine

## 2019-09-24 ENCOUNTER — Other Ambulatory Visit: Payer: Self-pay | Admitting: Family Medicine

## 2019-09-24 DIAGNOSIS — F321 Major depressive disorder, single episode, moderate: Secondary | ICD-10-CM

## 2019-09-24 DIAGNOSIS — F418 Other specified anxiety disorders: Secondary | ICD-10-CM | POA: Diagnosis not present

## 2019-09-24 DIAGNOSIS — R002 Palpitations: Secondary | ICD-10-CM | POA: Diagnosis not present

## 2019-09-24 MED ORDER — BUPROPION HCL ER (XL) 300 MG PO TB24: 300.0000 mg | ORAL_TABLET | Freq: Every day | ORAL | 1 refills | Status: DC

## 2019-09-24 MED ORDER — BUSPIRONE HCL 10 MG PO TABS: ORAL_TABLET | ORAL | 5 refills | Status: DC

## 2019-09-24 MED ORDER — SERTRALINE HCL 100 MG PO TABS: 200.0000 mg | ORAL_TABLET | Freq: Every day | ORAL | 1 refills | Status: DC

## 2019-09-24 MED ORDER — METOPROLOL TARTRATE 25 MG PO TABS: 25.0000 mg | ORAL_TABLET | Freq: Two times a day (BID) | ORAL | 0 refills | Status: DC

## 2019-09-24 NOTE — Progress Notes (Signed)
Telephone visit  Subjective: CC: f/u anxiety PCP: [name], DO [id number] H Hardeman is a 59 y.o. female calls for telephone consult today. Patient provides verbal consent for consult held via phone.  Due to COVID-19 pandemic this visit was conducted virtually. This visit type was conducted due to national recommendations for restrictions regarding the COVID-19 Pandemic (e.g. social distancing, sheltering in place) in an effort to limit this patient's exposure and mitigate transmission in our community. All issues noted in this document were discussed and addressed.  A physical exam was not performed with this format.   Location of patient: work Location of provider: Working remotely from home Others present for call: none  1.  Follow-up anxiety depression Patient reports ongoing stress related to being away from her mother.  She is hopeful that the vaccination will allow her to start seeing her mother again.  She really wants to have her mother moved back into the home with her and is working on trying to get this arranged.  She does note some stress with regards to the shortness of breath that she has been experiencing.  She is currently being worked up by pulmonology and they recommended repeat CT scan to follow-up on a lung nodule that was suspicious.  She canceled it because she was worried about possible exposure but will go ahead and reschedule it now that she understands what is being looked for.  Additionally, she notes she has a pulmonary function test scheduled and plans on having these done soon.  She had an evaluation by ENT about her chronic sinus headaches.  They did not feel that she had a sinus infection.  She has been using the Nettie pot and notes that it does not drain out of 1 side of her nose.  She continues to have fairly daily headaches.  She admits that this is possibly stress-induced.  She has formal in office evaluation with him soon.   ROS: Per HPI  Allergies   Allergen Reactions  . Amitriptyline     Tried 10mg  in past, made her feel "out of it"   Past Medical History:  Diagnosis Date  . Anemia   . 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 Bruises easily   . Depression   . Nephrolithiasis    Status post lithotripsy  . Palpitations   . Wears glasses     Current Outpatient Medications:  .  azelastine (ASTELIN) 0.1 % nasal spray, Place 1 spray into both nostrils 2 (two) times daily., Disp: 30 mL, Rfl: 12 .  buPROPion (WELLBUTRIN XL) 300 MG 24 hr tablet, Take 1 tablet (300 mg total) by mouth daily., Disp: 90 tablet, Rfl: 0 .  busPIRone (BUSPAR) 10 MG tablet, TAKE 2 TABLETS (20 MG TOTAL) BY MOUTH 2 (TWO) TIMES DAILY.(Needs to be seen before next refill), Disp: 120 tablet, Rfl: 0 .  diazepam (VALIUM) 2 MG tablet, Take 2 mg by mouth at bedtime., Disp: , Rfl: 0 .  metoprolol tartrate (LOPRESSOR) 25 MG tablet, TAKE 1 TABLET BY MOUTH TWICE A DAY, Disp: 60 tablet, Rfl: 2 .  Multiple Vitamin (MULTIVITAMIN) tablet, Take 1 tablet by mouth daily., Disp: , Rfl:  .  Red Yeast Rice Extract (RED YEAST RICE PO), Take by mouth., Disp: , Rfl:  .  sertraline (ZOLOFT) 100 MG tablet, Take 2 tablets (200 mg total) by mouth daily. (Needs to be seen before next refill), Disp: 60 tablet, Rfl: 0  Current Facility-Administered Medications:  .  cyanocobalamin ((VITAMIN B-12)) injection 1,000 mcg, 1,000 mcg, Intramuscular, Q30 days, Ronnie Doss M, DO, 1,000 mcg at 09/01/19 1521  Assessment/ Plan: 59 y.o. female   1. Current moderate episode of major depressive disorder without prior episode ([place]) Still having quite a bit of stress and anxiety.  I renewed her medications.  She has had evaluation by neuropsychology who does not suspect that she has underlying cognitive disorder.  Much of the behavioral health symptoms that she is experiencing are situational and directly related to her mother being in a facility rather than at home.  She cites is quite a bit during  her visit.  No recommendations for medication augmentation or adjustments were made during her neuropsychology visit.  For now, will continue with current regimen.  I am hoping that with the Covid vaccinations becoming more available that this will allow her to start seeing her mother again and hopefully reduce her depressive and anxiety symptoms. - sertraline (ZOLOFT) 100 MG tablet; Take 2 tablets (200 mg total) by mouth daily.  Dispense: 180 tablet; Refill: 1  2. Anxiety associated with depression - sertraline (ZOLOFT) 100 MG tablet; Take 2 tablets (200 mg total) by mouth daily.  Dispense: 180 tablet; Refill: 1 - busPIRone (BUSPAR) 10 MG tablet; TAKE 2 TABLETS (20 MG TOTAL) BY MOUTH 2 (TWO) TIMES DAILY.  Dispense: 120 tablet; Refill: 5 - buPROPion (WELLBUTRIN XL) 300 MG 24 hr tablet; Take 1 tablet (300 mg total) by mouth daily.  Dispense: 90 tablet; Refill: 1  3. Palpitations - metoprolol tartrate (LOPRESSOR) 25 MG tablet; Take 1 tablet (25 mg total) by mouth 2 (two) times daily.  Dispense: 180 tablet; Refill: 0  She has upcoming pulmonary function tests with Dr. Valeta Harms.  I did go ahead and encourage her to reschedule the CAT scan, as he is essentially trying to follow-up on the nodule that may or may not be an early stage I lung cancer.  PFTs are scheduled and I have encouraged her to also proceed with this given smoking history.  Rule out COPD or other lung disease that may be contributing to shortness of breath.  Start time: 10:31am End time: 10:44am  Total time spent on patient care (including telephone call/ virtual visit): 20 minutes  [place], [place] [phone]

## 2019-09-24 NOTE — Progress Notes (Signed)
[place]

## 2019-09-25 ENCOUNTER — Other Ambulatory Visit (HOSPITAL_COMMUNITY)
Admission: RE | Admit: 2019-09-25 | Discharge: 2019-09-25 | Disposition: A | Discharge status: Home / Self Care | Payer: BC Managed Care – PPO | Source: Ambulatory Visit | Attending: Pulmonary Disease | Admitting: Pulmonary Disease

## 2019-09-25 ENCOUNTER — Other Ambulatory Visit: Payer: Self-pay

## 2019-09-25 DIAGNOSIS — Z01812 Encounter for preprocedural laboratory examination: Secondary | ICD-10-CM | POA: Insufficient documentation

## 2019-09-25 DIAGNOSIS — Z20822 Contact with and (suspected) exposure to covid-19: Secondary | ICD-10-CM | POA: Insufficient documentation

## 2019-09-25 LAB — SARS CORONAVIRUS 2 (TAT 6-24 HRS): SARS Coronavirus 2: NEGATIVE

## 2019-09-29 ENCOUNTER — Other Ambulatory Visit: Payer: Self-pay

## 2019-09-29 ENCOUNTER — Ambulatory Visit (INDEPENDENT_AMBULATORY_CARE_PROVIDER_SITE_OTHER): Payer: BC Managed Care – PPO | Admitting: Pulmonary Disease

## 2019-09-29 ENCOUNTER — Ambulatory Visit (INDEPENDENT_AMBULATORY_CARE_PROVIDER_SITE_OTHER)
Admission: RE | Admit: 2019-09-29 | Discharge: 2019-09-29 | Disposition: A | Discharge status: Home / Self Care | Payer: BC Managed Care – PPO | Source: Ambulatory Visit | Attending: Pulmonary Disease | Admitting: Pulmonary Disease

## 2019-09-29 DIAGNOSIS — R918 Other nonspecific abnormal finding of lung field: Secondary | ICD-10-CM | POA: Diagnosis not present

## 2019-09-29 DIAGNOSIS — Z87891 Personal history of nicotine dependence: Secondary | ICD-10-CM | POA: Diagnosis not present

## 2019-09-29 LAB — PULMONARY FUNCTION TEST
DL/VA % pred: 114 %
DL/VA: 4.9 ml/min/mmHg/L
DLCO unc % pred: 103 %
DLCO unc: 19.43 ml/min/mmHg
FEF 25-75 Post: 2.25 L/sec
FEF 25-75 Pre: 1.78 L/sec
FEF2575-%Change-Post: 26 %
FEF2575-%Pred-Post: 102 %
FEF2575-%Pred-Pre: 81 %
FEV1-%Change-Post: 3 %
FEV1-%Pred-Post: 83 %
FEV1-%Pred-Pre: 80 %
FEV1-Post: 1.95 L
FEV1-Pre: 1.88 L
FEV1FVC-%Change-Post: 4 %
FEV1FVC-%Pred-Pre: 104 %
FEV6-%Change-Post: -1 %
FEV6-%Pred-Post: 77 %
FEV6-%Pred-Pre: 78 %
FEV6-Post: 2.27 L
FEV6-Pre: 2.3 L
FEV6FVC-%Change-Post: 0 %
FEV6FVC-%Pred-Post: 103 %
FEV6FVC-%Pred-Pre: 103 %
FVC-%Change-Post: -1 %
FVC-%Pred-Post: 75 %
FVC-%Pred-Pre: 75 %
FVC-Post: 2.28 L
FVC-Pre: 2.3 L
Post FEV1/FVC ratio: 85 %
Post FEV6/FVC ratio: 99 %
Pre FEV1/FVC ratio: 82 %
Pre FEV6/FVC Ratio: 100 %
RV % pred: 119 %
RV: 2.29 L
TLC % pred: 106 %
TLC: 5.08 L

## 2019-09-29 NOTE — Progress Notes (Signed)
Full PFT performed today. °

## 2019-09-30 ENCOUNTER — Ambulatory Visit: Payer: BC Managed Care – PPO

## 2019-10-01 ENCOUNTER — Ambulatory Visit (INDEPENDENT_AMBULATORY_CARE_PROVIDER_SITE_OTHER): Payer: BC Managed Care – PPO

## 2019-10-01 ENCOUNTER — Other Ambulatory Visit: Payer: Self-pay

## 2019-10-01 DIAGNOSIS — E538 Deficiency of other specified B group vitamins: Secondary | ICD-10-CM

## 2019-10-02 ENCOUNTER — Other Ambulatory Visit: Payer: Self-pay | Admitting: Family Medicine

## 2019-10-02 DIAGNOSIS — R6889 Other general symptoms and signs: Secondary | ICD-10-CM

## 2019-10-02 NOTE — Telephone Encounter (Signed)
Pt stopped taking this medication in 07/2019

## 2019-10-15 ENCOUNTER — Other Ambulatory Visit: Payer: Self-pay

## 2019-10-15 ENCOUNTER — Encounter: Payer: Self-pay | Admitting: Pulmonary Disease

## 2019-10-15 ENCOUNTER — Ambulatory Visit (INDEPENDENT_AMBULATORY_CARE_PROVIDER_SITE_OTHER): Payer: BC Managed Care – PPO | Admitting: Pulmonary Disease

## 2019-10-15 DIAGNOSIS — Z720 Tobacco use: Secondary | ICD-10-CM

## 2019-10-15 DIAGNOSIS — R911 Solitary pulmonary nodule: Secondary | ICD-10-CM | POA: Diagnosis not present

## 2019-10-15 DIAGNOSIS — R918 Other nonspecific abnormal finding of lung field: Secondary | ICD-10-CM | POA: Diagnosis not present

## 2019-10-15 NOTE — Progress Notes (Signed)
Virtual Visit via Telephone Note  I connected with Mandy Vargas on 10/15/19 at  4:15 PM EST by telephone and verified that I am speaking with the correct person using two identifiers.  Location: Patient: Mandy Vargas  Provider: Garner Nash, DO    I discussed the limitations, risks, security and privacy concerns of performing an evaluation and management service by telephone and the availability of in person appointments. I also discussed with the patient that there may be a patient responsible charge related to this service. The patient expressed understanding and agreed to proceed.   History of Present Illness: This is a 59 year old female past medical history of breast cancer, history of lumpectomy, status post resection, depression, anxiety.  Former smoker started age [age] 60 pack/day, greater than 45-pack-year history.  Had a new right upper lobe groundglass nodule subsolid 7 mm in size.  Recent CT imaging in January shows stability of this small nodule.  However it is still persistent concerning for potential underlying low-grade adenocarcinoma.  From a respiratory standpoint patient has been stable.  She has no complaints today.  Today we also reviewed her pulmonary function test via phone she had this completed last week.  No evidence of obstruction.  Reduced ERV likely related to body habitus.   Observations/Objective:  PFT Results Latest Ref Rng & Units 09/29/2019  FVC-Pre L 2.30  FVC-Predicted Pre % 75  FVC-Post L 2.28  FVC-Predicted Post % 75  Pre FEV1/FVC % % 82  Post FEV1/FCV % % 85  FEV1-Pre L 1.88  FEV1-Predicted Pre % 80  FEV1-Post L 1.95  DLCO UNC% % 103  DLCO COR %Predicted % 114  TLC L 5.08  TLC % Predicted % 106  RV % Predicted % 119    Pulmonary function test reviewed with patient today in the office.  Normal spirometry.  Reduced ERV.  No evidence of obstruction  09/29/2019 CT chest: 7 mm right apical subsolid groundglass opacity persistent since  last year.  Cannot exclude slow-growing adeno.  Longstanding history of smoking. Recommending 52-month follow-up due to size and slow progression. The patient's images have been independently reviewed by me.    Assessment and Plan: Groundglass small 7 mm right apical lung nodule High risk for malignancy due to smoking history Greater than 45 years of smoking started age 59, approximately 1 pack/day PFTs with no COPD  Plan: Enrollment in lung cancer screening program Referral placed orders placed. Repeat noncontrasted CT imaging, low-dose lung cancer screening CT in January 2022 for follow-up of right upper lobe nodule.  Follow Up Instructions: Return to clinic in 1 year after repeat CT imaging   I discussed the assessment and treatment plan with the patient. The patient was provided an opportunity to ask questions and all were answered. The patient agreed with the plan and demonstrated an understanding of the instructions.   The patient was advised to call back or seek an in-person evaluation if the symptoms worsen or if the condition fails to improve as anticipated.  I provided 22 minutes of non-face-to-face time during this encounter.   Garner Nash, DO

## 2019-10-15 NOTE — Patient Instructions (Signed)
Thank you for visiting Dr. Valeta Harms at [facility]. Today we recommend the following: Orders Placed This Encounter  Procedures  . Ambulatory Referral for Lung Cancer Scre   Return in about 1 year (around 10/14/2020) for w/ Dr. Valeta Harms . After CT imaging has been complete   Please do your part to reduce the spread of COVID-19.

## 2019-10-21 ENCOUNTER — Other Ambulatory Visit: Payer: Self-pay | Admitting: Family Medicine

## 2019-10-21 DIAGNOSIS — F418 Other specified anxiety disorders: Secondary | ICD-10-CM

## 2019-11-09 ENCOUNTER — Ambulatory Visit: Payer: BC Managed Care – PPO

## 2019-11-10 ENCOUNTER — Ambulatory Visit (INDEPENDENT_AMBULATORY_CARE_PROVIDER_SITE_OTHER): Payer: BC Managed Care – PPO | Admitting: Family Medicine

## 2019-11-10 DIAGNOSIS — R413 Other amnesia: Secondary | ICD-10-CM

## 2019-11-10 DIAGNOSIS — R002 Palpitations: Secondary | ICD-10-CM

## 2019-11-10 DIAGNOSIS — F418 Other specified anxiety disorders: Secondary | ICD-10-CM

## 2019-11-10 NOTE — Progress Notes (Signed)
Telephone visit  Subjective: CC: BP medication concern PCP: [name], DO [id number] H Packer is a 59 y.o. female calls for telephone consult today. Patient provides verbal consent for consult held via phone.  Due to COVID-19 pandemic this visit was conducted virtually. This visit type was conducted due to national recommendations for restrictions regarding the COVID-19 Pandemic (e.g. social distancing, sheltering in place) in an effort to limit this patient's exposure and mitigate transmission in our community. All issues noted in this document were discussed and addressed.  A physical exam was not performed with this format.   Location of patient: home Location of provider: Working remotely from home Others present for call: none  1.  Heart palpitations Patient reports that she no longer has heart palpitations.  She was started on metoprolol twice daily for this several years ago when she was going through a stressful time in her life.  She is wondering if she can come off of the beta-blocker as she has read that this may impact her memory.  She notes she has quite a bit of difficulty with remembering simple tasks.  Her short-term memory is what predominately seems to be affected.  She goes on to note that she is had neurocognitive testing done and is considering on having this repeated.  She wonders if her antianxiety and antidepressants also impact her memory.  She is still reluctant to see a psychiatrist for medication adjustment but would be interested in perhaps pursuing genetic testing to make sure that she is on the right medications.   ROS: Per HPI  Allergies  Allergen Reactions  . Amitriptyline     Tried 10mg  in past, made her feel "out of it"   Past Medical History:  Diagnosis Date  . Anemia   . 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 Bruises easily   . Depression   . Nephrolithiasis    Status post lithotripsy  . Palpitations   . Wears glasses     Current  Outpatient Medications:  .  azelastine (ASTELIN) 0.1 % nasal spray, Place 1 spray into both nostrils 2 (two) times daily., Disp: 30 mL, Rfl: 12 .  buPROPion (WELLBUTRIN XL) 300 MG 24 hr tablet, Take 1 tablet (300 mg total) by mouth daily., Disp: 90 tablet, Rfl: 1 .  busPIRone (BUSPAR) 10 MG tablet, TAKE 2 TABLETS (20 MG TOTAL) BY MOUTH 2 (TWO) TIMES DAILY., Disp: 360 tablet, Rfl: 2 .  diazepam (VALIUM) 2 MG tablet, Take 2 mg by mouth at bedtime., Disp: , Rfl: 0 .  fluticasone (FLONASE) 50 MCG/ACT nasal spray, SMARTSIG:1-2 Spray(s) Both Nares Daily, Disp: , Rfl:  .  metoprolol tartrate (LOPRESSOR) 25 MG tablet, Take 1 tablet (25 mg total) by mouth 2 (two) times daily., Disp: 180 tablet, Rfl: 0 .  Multiple Vitamin (MULTIVITAMIN) tablet, Take 1 tablet by mouth daily., Disp: , Rfl:  .  Red Yeast Rice Extract (RED YEAST RICE PO), Take by mouth., Disp: , Rfl:  .  sertraline (ZOLOFT) 100 MG tablet, Take 2 tablets (200 mg total) by mouth daily., Disp: 180 tablet, Rfl: 1  Current Facility-Administered Medications:  .  cyanocobalamin ((VITAMIN B-12)) injection 1,000 mcg, 1,000 mcg, Intramuscular, Q30 days, Ronnie Doss M, DO, 1,000 mcg at 10/01/19 1543  Assessment/ Plan: 59 y.o. female   1. Memory problem Has been previously evaluated by neuropsychiatry and memory impairments were thought to be related to depression and anxiety.  She is considering having retesting done with  Dr. Vikki Ports soon given her mother's history of Alzheimer's.  2. Palpitations Resolved.  She would like to try and come off of the beta-blocker since this has some impact on memory.  We discussed reducing to 1/2 tablet twice daily for 2 weeks then 1/2 tablet daily for 1 to 2 weeks.  Then okay to stop.  3. Anxiety associated with depression We discussed consideration for genetic testing.  She seemed very interested in this today.  She will schedule an appointment with [facility] to have gene site testing.   Start time:  3:49pm End time: 4:08pm  Total time spent on patient care (including telephone call/ virtual visit): 85minutes  [place], [place] [phone]

## 2019-11-12 ENCOUNTER — Telehealth: Payer: Self-pay | Admitting: Family Medicine

## 2019-11-12 NOTE — Telephone Encounter (Signed)
Appt made

## 2019-11-12 NOTE — Telephone Encounter (Signed)
Yes, this can be scheduled with me.

## 2019-11-12 NOTE — Telephone Encounter (Signed)
Okay to schedule this with you? Please advise. Front was not sure this was something they could do because they did not know you done this?

## 2019-11-16 ENCOUNTER — Ambulatory Visit: Payer: BC Managed Care – PPO | Attending: Internal Medicine

## 2019-11-16 DIAGNOSIS — Z23 Encounter for immunization: Secondary | ICD-10-CM

## 2019-11-16 NOTE — Progress Notes (Signed)
   Covid-19 Vaccination Clinic  Name:  Mandy Vargas    MRN: [id number] DOB: 1961-01-06  11/16/2019  [name] was observed post Covid-19 immunization for 15 minutes without incident. She was provided with Vaccine Information Sheet and instruction to access the V-Safe system.   [name] was instructed to call 911 with any severe reactions post vaccine: Marland Kitchen Difficulty breathing  . Swelling of face and throat  . A fast heartbeat  . A bad rash all over body  . Dizziness and weakness   Immunizations Administered    Name Date Dose VIS Date Route   Pfizer COVID-19 Vaccine 11/16/2019 12:05 PM 0.3 mL 08/22/2019 Intramuscular   Manufacturer: [place]   Lot: [id number]   [place]: [id number]

## 2019-11-18 ENCOUNTER — Ambulatory Visit: Payer: BC Managed Care – PPO

## 2019-11-19 ENCOUNTER — Other Ambulatory Visit: Payer: Self-pay

## 2019-11-20 ENCOUNTER — Encounter: Payer: Self-pay | Admitting: Family Medicine

## 2019-11-20 ENCOUNTER — Ambulatory Visit: Payer: BC Managed Care – PPO | Admitting: Family Medicine

## 2019-11-20 VITALS — BP 99/64 | HR 67 | Temp 98.1°F | Ht 61.0 in | Wt 141.0 lb

## 2019-11-20 DIAGNOSIS — F418 Other specified anxiety disorders: Secondary | ICD-10-CM | POA: Diagnosis not present

## 2019-11-20 DIAGNOSIS — E538 Deficiency of other specified B group vitamins: Secondary | ICD-10-CM

## 2019-11-20 DIAGNOSIS — F321 Major depressive disorder, single episode, moderate: Secondary | ICD-10-CM | POA: Diagnosis not present

## 2019-11-20 NOTE — Progress Notes (Signed)
Assessment & Plan:  1-2. 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    Follow up plan: Return if symptoms worsen or fail to improve.  Hendricks Limes, MSN, APRN, FNP-C [facility]  Subjective:   Patient ID: Mandy Vargas, female    DOB: Oct 27, 1960, 59 y.o.   MRN: [id number]  HPI: VANEZZA CORR is a 59 y.o. female presenting on 11/20/2019 for depressing medication testing  Patient is here to discuss GeneSight for psychiatric medications.  She reports she has failed numerous medications in the past including Lexapro, Celexa, and Cymbalta.  They are unable to recall the names of them.  Depression screen [facility] 2/9 11/24/2019 07/11/2019 06/19/2019  Decreased Interest 2 2 0  Down, Depressed, Hopeless 2 2 0  PHQ - 2 Score 4 4 0  Altered sleeping 3 2 -  Tired, decreased energy 3 3 -  Change in appetite 2 0 -  Feeling bad or failure about yourself  2 1 -  Trouble concentrating 2 2 -  Moving slowly or fidgety/restless 1 0 -  Suicidal thoughts 0 0 -  PHQ-9 Score 17 12 -  Difficult doing work/chores Somewhat difficult Somewhat difficult -  Some encounter information is confidential and restricted. Go to Review Flowsheets activity to see all data.  Some recent data might be hidden   GAD 7 : Generalized Anxiety Score 11/24/2019 07/11/2019 11/04/2018  Nervous, Anxious, on Edge 2 2 3   Control/stop worrying 2 2 3   Worry too much - different things 2 2 3   Trouble relaxing 2 0 3  Restless 2 0 3  Easily annoyed or irritable 2 1 3   Afraid - awful might happen 0 2 1  Total GAD 7 Score 12 9 19   Anxiety Difficulty Somewhat difficult Somewhat difficult -    ROS: Negative unless specifically indicated above in HPI.   Relevant past medical history reviewed and updated as indicated.   Allergies and medications reviewed and  updated.   Current Outpatient Medications:  .  azelastine (ASTELIN) 0.1 % nasal spray, Place 1 spray into both nostrils 2 (two) times daily., Disp: 30 mL, Rfl: 12 .  buPROPion (WELLBUTRIN XL) 300 MG 24 hr tablet, Take 1 tablet (300 mg total) by mouth daily., Disp: 90 tablet, Rfl: 1 .  busPIRone (BUSPAR) 10 MG tablet, TAKE 2 TABLETS (20 MG TOTAL) BY MOUTH 2 (TWO) TIMES DAILY., Disp: 360 tablet, Rfl: 2 .  diazepam (VALIUM) 2 MG tablet, Take 2 mg by mouth at bedtime., Disp: , Rfl: 0 .  fluticasone (FLONASE) 50 MCG/ACT nasal spray, SMARTSIG:1-2 Spray(s) Both Nares Daily, Disp: , Rfl:  .  metoprolol tartrate (LOPRESSOR) 25 MG tablet, Take 1 tablet (25 mg total) by mouth 2 (two) times daily., Disp: 180 tablet, Rfl: 0 .  Multiple Vitamin (MULTIVITAMIN) tablet, Take 1 tablet by mouth daily., Disp: , Rfl:  .  Red Yeast Rice Extract (RED YEAST RICE PO), Take by mouth., Disp: , Rfl:  .  sertraline (ZOLOFT) 100 MG tablet, Take 2 tablets (200 mg total) by mouth daily., Disp: 180 tablet, Rfl: 1  Current Facility-Administered Medications:  .  cyanocobalamin ((VITAMIN B-12)) injection 1,000 mcg, 1,000 mcg, Intramuscular, Q30 days, Ronnie Doss M, DO, 1,000 mcg at 11/20/19 1655  Allergies  Allergen Reactions  . Amitriptyline     Tried 10mg  in past, made her feel "out of it"  Objective:   BP 99/64   Pulse 67   Temp 98.1 F (36.7 C) (Temporal)   Ht 5\' 1"  (1.549 m)   Wt 141 lb (64 kg)   LMP 12/13/2011   SpO2 99%   BMI 26.64 kg/m    Physical Exam Vitals reviewed.  Constitutional:      General: She is not in acute distress.    Appearance: Normal appearance. She is not ill-appearing, toxic-appearing or diaphoretic.  HENT:     Head: Normocephalic and atraumatic.  Eyes:     General: No scleral icterus.       Right eye: No discharge.        Left eye: No discharge.     Conjunctiva/sclera: Conjunctivae normal.  Cardiovascular:     Rate and Rhythm: Normal rate.  Pulmonary:     Effort:  Pulmonary effort is normal. No respiratory distress.  Musculoskeletal:        General: Normal range of motion.     Cervical back: Normal range of motion.  Skin:    General: Skin is warm and dry.     Capillary Refill: Capillary refill takes less than 2 seconds.  Neurological:     General: No focal deficit present.     Mental Status: She is alert and oriented to person, place, and time. Mental status is at baseline.  Psychiatric:        Mood and Affect: Mood normal.        Behavior: Behavior normal.        Thought Content: Thought content normal.        Judgment: Judgment normal.

## 2019-11-20 NOTE — Patient Instructions (Signed)
GeneSight

## 2019-11-24 ENCOUNTER — Encounter: Payer: Self-pay | Admitting: Family Medicine

## 2019-12-03 ENCOUNTER — Ambulatory Visit (INDEPENDENT_AMBULATORY_CARE_PROVIDER_SITE_OTHER): Payer: BC Managed Care – PPO | Admitting: Family Medicine

## 2019-12-03 NOTE — Progress Notes (Signed)
Patient does not need appt.

## 2019-12-07 ENCOUNTER — Other Ambulatory Visit: Payer: Self-pay

## 2019-12-07 ENCOUNTER — Ambulatory Visit: Payer: BC Managed Care – PPO | Attending: Internal Medicine

## 2019-12-07 DIAGNOSIS — Z23 Encounter for immunization: Secondary | ICD-10-CM

## 2019-12-07 NOTE — Progress Notes (Signed)
   Covid-19 Vaccination Clinic  Name:  Mandy Vargas    MRN: [id number] DOB: 1961/04/11  12/07/2019  Mandy Vargas was observed post Covid-19 immunization for 15 minutes without incident. She was provided with Vaccine Information Sheet and instruction to access the V-Safe system.   Mandy Vargas was instructed to call 911 with any severe reactions post vaccine: Marland Kitchen Difficulty breathing  . Swelling of face and throat  . A fast heartbeat  . A bad rash all over body  . Dizziness and weakness   Immunizations Administered    Name Date Dose VIS Date Route   Pfizer COVID-19 Vaccine 12/07/2019  1:38 PM 0.3 mL 08/22/2019 Intramuscular   Manufacturer: [place]   Lot: [id number]   [place]: [id number]

## 2020-01-02 ENCOUNTER — Encounter: Payer: Self-pay | Admitting: Family Medicine

## 2020-01-02 ENCOUNTER — Ambulatory Visit (INDEPENDENT_AMBULATORY_CARE_PROVIDER_SITE_OTHER): Payer: BC Managed Care – PPO | Admitting: Family Medicine

## 2020-01-02 ENCOUNTER — Other Ambulatory Visit: Payer: Self-pay

## 2020-01-02 VITALS — BP 109/59 | HR 86 | Temp 97.6°F | Ht 61.0 in | Wt 139.6 lb

## 2020-01-02 DIAGNOSIS — F5104 Psychophysiologic insomnia: Secondary | ICD-10-CM | POA: Diagnosis not present

## 2020-01-02 DIAGNOSIS — L918 Other hypertrophic disorders of the skin: Secondary | ICD-10-CM | POA: Diagnosis not present

## 2020-01-02 DIAGNOSIS — J321 Chronic frontal sinusitis: Secondary | ICD-10-CM | POA: Diagnosis not present

## 2020-01-02 MED ORDER — RAMELTEON 8 MG PO TABS: 8.0000 mg | ORAL_TABLET | Freq: Every day | ORAL | 5 refills | Status: DC

## 2020-01-02 NOTE — Patient Instructions (Addendum)
Ramelteon tablets What is this medicine? RAMELTEON (ram EL tee on) is used to treat insomnia. This medicine helps you to fall asleep. This medicine may be used for other purposes; ask your health care provider or pharmacist if you have questions. COMMON BRAND NAME(S): Rozerem What should I tell my health care provider before I take this medicine? They need to know if you have any of these conditions:  depression  history of a drug or alcohol abuse problem  liver disease  lung or breathing disease  suicidal thoughts  an unusual or allergic reaction to ramelteon, other medicines, foods, dyes, or preservatives  pregnant or trying to get pregnant  breast-feeding How should I use this medicine? Take this medicine by mouth with a glass of water. Do not break tablets; swallow whole. Follow the directions on the prescription label. It is better to take this medicine on an empty stomach and only when you are ready for bed. Do not take your medicine more often than directed. A special MedGuide will be given to you by the pharmacist with each prescription and refill. Be sure to read this information carefully each time. Talk to your pediatrician regarding the use of this medicine in children. Special care may be needed. Overdosage: If you think you have taken too much of this medicine contact a poison control center or emergency room at once. NOTE: This medicine is only for you. Do not share this medicine with others. What if I miss a dose? This does not apply. This medicine should only be taken immediately before going to sleep. Do not take double or extra doses. What may interact with this medicine? Do not take this medicine with any of the following medications:  fluvoxamine  melatonin This medicine may also interact with the following medications:  medicines used to treat fungal infections like ketoconazole, fluconazole, or itraconazole  rifampin This list may not describe all  possible interactions. Give your health care provider a list of all the medicines, herbs, non-prescription drugs, or dietary supplements you use. Also tell them if you smoke, drink alcohol, or use illegal drugs. Some items may interact with your medicine. What should I watch for while using this medicine? Visit your doctor or health care professional for regular checks on your progress. Keep a regular sleep schedule by going to bed at about the same time each night. Avoid caffeine-containing drinks in the evening hours. Talk to your doctor if you still have trouble sleeping within 7 to 10 days of using this medicine. This may mean there is another cause for your sleep problems. After taking this medicine, you may get up out of bed and do an activity that you do not know you are doing. The next morning, you may have no memory of this. Activities include driving a car ("sleep-driving"), making and eating food, talking on the phone, sexual activity, and sleep-walking. Serious injuries have occurred. Call your doctor right away if you find out you have done any of these activities. Do not take this medicine if you have used alcohol that evening. Do not take it if you have taken another medicine for sleep. The risk of doing these sleep-related activities is higher. Do not take this medicine unless you are able to stay in bed for a full night (7 to 8 hours) before you must be active again. You may have a decrease in mental alertness the day after use, even if you feel that you are fully awake. Tell your doctor if   you will need to perform activities requiring full alertness, such as driving, the next day. Do not stand or sit up quickly after taking this medicine, especially if you are an older patient. This reduces the risk of dizzy or fainting spells. If you or your family notice any changes in your behavior, such as new or worsening depression, thoughts of harming yourself, anxiety, other unusual or disturbing  thoughts, or memory loss, call your doctor right away. What side effects may I notice from receiving this medicine? Side effects that you should report to your doctor or health care professional as soon as possible:  allergic reactions like skin rash, itching or hives, swelling of the face, lips, or tongue  breast milk production or discharge  breathing problems  joint or muscle pain  depression, suicidal thoughts  missed monthly period (for women)  unusual activities while asleep like driving, eating, making phone calls  unusually weak or tired  worsening of insomnia Side effects that usually do not require medical attention (report to your doctor or health care professional if they continue or are bothersome):  bad taste  daytime sleepiness  decreased sex drive  diarrhea  headache  nausea This list may not describe all possible side effects. Call your doctor for medical advice about side effects. You may report side effects to FDA at 1-800-FDA-1088. Where should I keep my medicine? Keep out of the reach of children. Store at room temperature between 15 and 30 degrees C (59 and 86 degrees F). Keep the container tightly closed. Protect from moisture and humidity. Throw away any unused medicine after the expiration date. NOTE: This sheet is a summary. It may not cover all possible information. If you have questions about this medicine, talk to your doctor, pharmacist, or health care provider.  2020 Elsevier/Gold Standard (2018-02-22 12:18:24)  

## 2020-01-02 NOTE — Progress Notes (Signed)
Subjective: CC: skin tag PCP: [name], DO [id number] [name] is a 59 y.o. female presenting to clinic today for:  1. Skin tag Patient reports a skin tag on the right upper back that has been getting caught on her clothes and bleeding.  She has mild pain at the affected area.  She like to get this removed  2.  Insomnia Patient reports ongoing difficulty with sleep.  She tried melatonin but this has not been especially helpful.  She tries not to take any Valium because she does not want to have dependence on it.  She continues to have anxiety and depressive symptoms despite use of Wellbutrin and Zoloft.  She found that the sleep seem to be worse after she started titrating off of the Lopressor.    ROS: Per HPI  Allergies  Allergen Reactions  . Amitriptyline     Tried 10mg  in past, made her feel "out of it"   Past Medical History:  Diagnosis Date  . Anemia   . 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 Bruises easily   . Depression   . Nephrolithiasis    Status post lithotripsy  . Palpitations   . Wears glasses     Current Outpatient Medications:  .  azelastine (ASTELIN) 0.1 % nasal spray, Place 1 spray into both nostrils 2 (two) times daily., Disp: 30 mL, Rfl: 12 .  buPROPion (WELLBUTRIN XL) 300 MG 24 hr tablet, Take 1 tablet (300 mg total) by mouth daily., Disp: 90 tablet, Rfl: 1 .  busPIRone (BUSPAR) 10 MG tablet, TAKE 2 TABLETS (20 MG TOTAL) BY MOUTH 2 (TWO) TIMES DAILY., Disp: 360 tablet, Rfl: 2 .  diazepam (VALIUM) 2 MG tablet, Take 2 mg by mouth at bedtime., Disp: , Rfl: 0 .  fluticasone (FLONASE) 50 MCG/ACT nasal spray, SMARTSIG:1-2 Spray(s) Both Nares Daily, Disp: , Rfl:  .  metoprolol tartrate (LOPRESSOR) 25 MG tablet, Take 1 tablet (25 mg total) by mouth 2 (two) times daily., Disp: 180 tablet, Rfl: 0 .  Multiple Vitamin (MULTIVITAMIN) tablet, Take 1 tablet by mouth daily., Disp: , Rfl:  .  Red Yeast Rice Extract (RED YEAST RICE PO), Take by mouth.,  Disp: , Rfl:  .  sertraline (ZOLOFT) 100 MG tablet, Take 2 tablets (200 mg total) by mouth daily., Disp: 180 tablet, Rfl: 1 .  ramelteon (ROZEREM) 8 MG tablet, Take 1 tablet (8 mg total) by mouth at bedtime., Disp: 30 tablet, Rfl: 5  Current Facility-Administered Medications:  .  cyanocobalamin ((VITAMIN B-12)) injection 1,000 mcg, 1,000 mcg, Intramuscular, Q30 days, Navjot Loera M, DO, 1,000 mcg at 11/20/19 1655 Social History   Socioeconomic History  . Marital status: Married    Spouse name: Not on file  . Number of children: Not on file  . Years of education: Not on file  . Highest education level: Not on file  Occupational History  . Not on file  Tobacco Use  . Smoking status: Former Smoker    Packs/day: 0.25    Years: 1.00    Pack years: 0.25    Types: Cigarettes    Quit date: 09/12/2011    Years since quitting: 8.3  . Smokeless tobacco: Never Used  Substance and Sexual Activity  . Alcohol use: No    Alcohol/week: 0.0 standard drinks  . Drug use: No  . Sexual activity: Yes    Birth control/protection: Post-menopausal  Other Topics Concern  . 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 Difficulty of Paying Living Expenses:   Food Insecurity:   . Worried About Charity fundraiser in the Last Year:   . Arboriculturist in the Last Year:   Transportation Needs:   . Film/video editor (Medical):   Marland Kitchen Lack of Transportation (Non-Medical):   Physical Activity:   . Days of Exercise per Week:   . Minutes of Exercise per Session:   Stress:   . Feeling of Stress :   Social Connections:   . Frequency of Communication with Friends and Family:   . Frequency of Social Gatherings with Friends and Family:   . Attends Religious Services:   . Active Member of Clubs or Organizations:   . Attends Archivist  Meetings:   Marland Kitchen Marital Status:   Intimate Partner Violence:   . Fear of Current or Ex-Partner:   . Emotionally Abused:   Marland Kitchen Physically Abused:   . Sexually Abused:    Family History  Problem Relation Age of Onset  . Colon cancer Mother   . Alzheimer's disease Mother   . Depression Mother   . Breast cancer Sister   . Cancer Sister 68       Breast  . Bipolar disorder Sister   . Heart attack Father 10       Deceased  . Cancer Maternal Aunt        Breast  . Colon cancer Paternal Grandmother        40'S  . Healthy Son   . Anesthesia problems Neg Hx     Objective: Office vital signs reviewed. BP (!) 109/59   Pulse 86   Temp 97.6 F (36.4 C)   Ht 5\' 1"  (1.549 m)   Wt 139 lb 9.6 oz (63.3 kg)   LMP 12/13/2011   SpO2 95%   BMI 26.38 kg/m   Physical Examination:  General: Awake, alert, well nourished, No acute distress HEENT: Septal deviation with narrowing of the left nare.  No drainage, erythema, edema.  TMs intact bilaterally without evidence of infection Skin: She has a skin tag that is partially hanging off the right upper back.  There is mild bleeding.  There is mild associated redness from where it is partially torn from the skin. Psych: Mood stable, speech normal, affect appropriate, pleasant and interactive, good eye contact Depression screen [facility] 2/9 01/02/2020 11/24/2019 07/11/2019  Decreased Interest 1 2 2   Down, Depressed, Hopeless 1 2 2   PHQ - 2 Score 2 4 4   Altered sleeping 3 3 2   Tired, decreased energy 3 3 3   Change in appetite 3 2 0  Feeling bad or failure about yourself  1 2 1   Trouble concentrating 3 2 2   Moving slowly or fidgety/restless 1 1 0  Suicidal thoughts 0 0 0  PHQ-9 Score 16 17 12   Difficult doing work/chores Somewhat difficult Somewhat difficult Somewhat difficult  Some encounter information is confidential and restricted. Go to Review Flowsheets activity to see all data.  Some recent data might be hidden   GAD 7 : Generalized Anxiety Score  01/02/2020 11/24/2019 07/11/2019 11/04/2018  Nervous, Anxious, on Edge 3 2 2 3   Control/stop worrying 3 2 2 3   Worry too much - different things 3 2 2 3   Trouble relaxing 3 2 0 3  Restless  3 2 0 3  Easily annoyed or irritable 3 2 1 3   Afraid - awful might happen 0 0 2 1  Total GAD 7 Score 18 12 9 19   Anxiety Difficulty Somewhat difficult Somewhat difficult Somewhat difficult -    PROCEDURE NOTE: Skin tag removal Patient given informed consent, signed copy in the chart.  Appropriate time out taken. Areas of concern cleansed with alcohol swabs.  Area sprayed with ethyl chloride spray.  Once anaesthesia obtained, skin tags were removed using sterile scissors.  1 skin tags were removed in total.  The patient tolerated the procedure well.  Minimal bleeding.  Patient given post procedure instructions.   Assessment/ Plan: 59 y.o. female   1. Inflamed skin tag Removed successfully and without complications.  Home care directions were reinforced  2. Chronic frontal sinusitis No evidence of infection.  Okay to proceed with prednisone as prescribed by ENT  3. Psychophysiological insomnia Trial of RozeREM at bedtime.  She will follow as needed on this issue - ramelteon (ROZEREM) 8 MG tablet; Take 1 tablet (8 mg total) by mouth at bedtime.  Dispense: 30 tablet; Refill: 5   No orders of the defined types were placed in this encounter.  Meds ordered this encounter  Medications  . ramelteon (ROZEREM) 8 MG tablet    Sig: Take 1 tablet (8 mg total) by mouth at bedtime.    Dispense:  30 tablet    Refill:  [place], [place] [phone]

## 2020-01-03 MED ORDER — PREDNISONE 10 MG (21) PO TBPK: ORAL_TABLET | ORAL | 0 refills | Status: DC

## 2020-01-03 NOTE — Addendum Note (Signed)
Addended by: [name] on: 01/03/2020 10:37 AM   Modules accepted: Orders

## 2020-01-05 ENCOUNTER — Other Ambulatory Visit: Payer: Self-pay

## 2020-01-05 ENCOUNTER — Ambulatory Visit (INDEPENDENT_AMBULATORY_CARE_PROVIDER_SITE_OTHER): Payer: BC Managed Care – PPO

## 2020-01-05 ENCOUNTER — Telehealth: Payer: Self-pay | Admitting: Family Medicine

## 2020-01-05 DIAGNOSIS — R519 Headache, unspecified: Secondary | ICD-10-CM | POA: Diagnosis not present

## 2020-01-05 DIAGNOSIS — E538 Deficiency of other specified B group vitamins: Secondary | ICD-10-CM

## 2020-01-05 MED ORDER — METHYLPREDNISOLONE ACETATE 40 MG/ML IJ SUSP
40.0000 mg | Freq: Once | INTRAMUSCULAR | Status: AC
Start: 2020-01-05 — End: 2020-01-05
  Administered 2020-01-05: 40 mg via INTRAMUSCULAR

## 2020-01-05 NOTE — Progress Notes (Signed)
Cyanocobalamin injection given to right deltoid.  Patient tolerated well.  Depo Medrol 40 mg given to right upper outer quadrant.  Patient tolerated well.

## 2020-01-05 NOTE — Telephone Encounter (Signed)
If she prefers, she can come in for depo medrol 40mg  IM.  See if triage is willing to administer for patient.

## 2020-01-05 NOTE — Telephone Encounter (Signed)
Pt agreed to come in today at 2pm and see Triage Nurse for Depo Medrol 40mg 

## 2020-01-06 ENCOUNTER — Encounter: Payer: Self-pay | Admitting: *Deleted

## 2020-01-06 ENCOUNTER — Telehealth: Payer: Self-pay | Admitting: Family Medicine

## 2020-01-06 NOTE — Telephone Encounter (Signed)
Ok to provide

## 2020-01-06 NOTE — Telephone Encounter (Signed)
Patient states she started taking the Rozerem that was prescribed on Friday but beginning on Sunday she has been experiencing nausea.  She missed work yesterday and wants to know if she can have a work note.  Please advise.

## 2020-01-06 NOTE — Telephone Encounter (Signed)
Note printed, pt aware

## 2020-01-06 NOTE — Telephone Encounter (Signed)
Pt is requesting a doctors note for her visit yesterday. Says she forgot to ask for one. Please call when ready.

## 2020-01-14 ENCOUNTER — Encounter (HOSPITAL_COMMUNITY): Payer: Self-pay | Admitting: Physical Therapy

## 2020-01-14 ENCOUNTER — Ambulatory Visit (HOSPITAL_COMMUNITY): Payer: BC Managed Care – PPO | Attending: Otolaryngology | Admitting: Physical Therapy

## 2020-01-14 ENCOUNTER — Other Ambulatory Visit: Payer: Self-pay

## 2020-01-14 DIAGNOSIS — R262 Difficulty in walking, not elsewhere classified: Secondary | ICD-10-CM | POA: Diagnosis present

## 2020-01-14 DIAGNOSIS — R2689 Other abnormalities of gait and mobility: Secondary | ICD-10-CM | POA: Insufficient documentation

## 2020-01-14 DIAGNOSIS — Z9181 History of falling: Secondary | ICD-10-CM | POA: Diagnosis present

## 2020-01-14 NOTE — Therapy (Signed)
[place], [place], [address] Phone: [phone]   Fax:  [phone]  Physical Therapy Evaluation  Patient Details  Name: Mandy Vargas MRN: [id number] Date of Birth: [birth year] Referring Provider (PT): Leta Baptist   Encounter Date: 01/14/2020  PT End of Session - 01/14/20 1616    Visit Number  1    Number of Visits  12   eval on 01/14/20   Date for PT Re-Evaluation  02/25/20    Authorization Type  BCBS no auth or VL    Progress Note Due on Visit  10    PT Start Time  1616    PT Stop Time  1656    PT Time Calculation (min)  40 min    Activity Tolerance  Patient tolerated treatment well    Behavior During Therapy  [facility] for tasks assessed/performed       Past Medical History:  Diagnosis Date  . Anemia   . 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 Bruises easily   . Depression   . Nephrolithiasis    Status post lithotripsy  . Palpitations   . Wears glasses     Past Surgical History:  Procedure Laterality Date  . BREAST BIOPSY  12/22/11   DCIS w/calcifications  . BREAST LUMPECTOMY Left 02/19/12   Left breast, DCIS, ER/PR+  . DILATION AND CURETTAGE OF UTERUS  1993  . KIDNEY STONE SURGERY      There were no vitals filed for this visit.   Subjective Assessment - 01/14/20 1654    Subjective  Patient reports she has been off balance for a while. Reports not position or movement that makes it worse. It just comes and goes. States that she hasn't fallen but does have "almost falls" about 3 times a week. States she has no dizziness, nausea or vertigo. Reports occasional forgetfulness but that may have to do with her depression. Reports she was also diagnosed with a mild cognitive disorder. Reports she is fearful of stepping down her stairs because they are narrow and she fears she will fall.    Pertinent History  depression, memory issues    Patient Stated Goals  to improve her balance and to be able to walk better     Currently in Pain?  No/denies         [facility] PT Assessment - 01/14/20 0001      Assessment   Medical Diagnosis  balance issues    Referring Provider (PT)  Su Teoh    Prior Therapy  no      Precautions   Precautions  None      Restrictions   Weight Bearing Restrictions  No      Balance Screen   Has the patient fallen in the past 6 months  No      [place] residence    Living Arrangements  Spouse/significant other    Available Help at [place] to enter    Entrance Stairs-Number of Steps  [place]  One level      Prior Function   Level of Independence  Independent    Vocation  Full time employment      Cognition   Overall Cognitive Status  Within Functional Limits for tasks assessed      Observation/Other Assessments   Observations  no symptoms or  balance issues with bendign forward, walking, squating, or with stairs      Ambulation/Gait   Ambulation/Gait  Yes    Ambulation/Gait Assistance  7: Independent    Assistive device  None    Stairs  Yes    Stair Management Technique  No rails;Alternating pattern;Forwards      Balance   Balance Assessed  Yes      Static Standing Balance   Static Standing - Balance Support  No upper extremity supported    Static Standing - Level of Assistance  6: Modified independent (Device/Increase time)    Static Standing Balance -  Activities   Single Leg Stance - Right Leg;Single Leg Stance - Left Leg;Tandam Stance - Right Leg;Tandam Stance - Left Leg;Romberg - Eyes Opened;Romberg - Eyes Closed;Romberg - Eyes Opened, Foam;Romberg - Eyes Closed , Foam;Sharpened Romberg - Eyes Open;Sharpened Romberg - Eyes Open, Foam;Sharpened Romberg - Eyes Closed    Static Standing - Comment/# of Minutes  Romberg  EO and EC on floor - 30 seconds each - increased sway EC; Romberg EO and EC on foam 30 seconds with increased moderate sway with EC; Sharpened Romberg Floor EO - 30  seconds Bilaterally with left posterior more sway; Sharpened Romberg Floor EC L posterior 3 seconds and right posterior 30 seconds. Sharpened Romberg EO foam R posterior 9 seconds and left posterior 30 seconds. L SLS on floor EO 11 seconds, R SLS on floor with EO 19 seconds. SLS on floor EC 2 seconds on either foot      Dynamic Standing Balance   Dynamic Standing - Balance Support  No upper extremity supported    Dynamic Standing - Level of Assistance  6: Modified independent (Device/Increase time)    Dynamic Standing - Balance Activities  Eyes opn   tandem walk   Dynamic Standing - Comments  tandem walk looking down - mod sway, looking up less sway but patient more fearful      High Level Balance   High Level Balance Activites  Head turns;Side stepping    High Level Balance Comments  no dizziness or loss of balance noted      Functional Gait  Assessment   Gait assessed   Yes                Objective measurements completed on examination: See above findings.                PT Short Term Goals - 01/14/20 1656      PT SHORT TERM GOAL #1   Title  Patient will report at least 25% improvement in overall symptoms and/or functional ability.    Time  3    Period  Weeks    Status  New    Target Date  02/04/20      PT SHORT TERM GOAL #2   Title  Patient will be able to stand on either leg for at least 10 seconds on the floor to demonstrate improved static balance.    Time  3    Period  Weeks    Status  New    Target Date  02/04/20      PT SHORT TERM GOAL #3   Title  Patient will be independent in self management strategies to improve quality of life and functional outcomes.    Time  3    Period  Weeks    Status  New    Target Date  02/04/20  PT Long Term Goals - 01/14/20 1657      PT LONG TERM GOAL #1   Title  Patient will report at least 50% improvement in overall symptoms and/or functional ability.    Time  6    Period  Weeks    Status  New     Target Date  02/25/20      PT LONG TERM GOAL #2   Title  Patient will report improved ability to walk down stairs of her deck to improve functional mobility and decreased fear of falling at home.    Time  6    Period  Weeks    Status  New    Target Date  02/25/20      PT LONG TERM GOAL #3   Title  Patient will be able to stand on floow with either leg posterior eyes closed with feet in tandem for at least 10 seconds to demonstrate improved static balance.    Time  6    Period  Weeks    Status  New    Target Date  02/25/20             Plan - 01/14/20 1655    Clinical Impression Statement  Patient presents to therapy with vestibular referral but with symptoms consistent with balance impairments. No dizziness, vertigo, nausea reported during session or as a symptom. No transitional onset of dizziness with static balance being patient's primary limitation at this time. Patient would greatly benefit from skilled physical therapy focusing on improving balance and patient's overall safety at home.    Personal Factors and Comorbidities  Comorbidity 1;Comorbidity 2    Comorbidities  depression, memory issues    Examination-Activity Limitations  Locomotion Level;Squat    Stability/Clinical Decision Making  Stable/Uncomplicated    Clinical Decision Making  Low    Rehab Potential  Good    PT Frequency  2x / week    PT Duration  6 weeks    PT Treatment/Interventions  ADLs/Self Care Home Management;Neuromuscular re-education;Patient/family education;Therapeutic activities;Therapeutic exercise;Balance training;Functional mobility training;Stair training;Gait training;Biofeedback    PT Next Visit Plan  static balance and dynamic balance challenges    PT Home Exercise Plan  5/5 tandem on pillow    Consulted and Agree with Plan of Care  Patient       Patient will benefit from skilled therapeutic intervention in order to improve the following deficits and impairments:  Decreased balance,  Decreased activity tolerance  Visit Diagnosis: History of falling  Balance problems  Difficulty in walking, not elsewhere classified     Problem List Patient Active Problem List   Diagnosis Date Noted  . Pancolitis ([place]) 04/04/2019  . Memory problem 02/19/2017  . Lung nodule 04/05/2016  . BMI 26.0-26.9,adult 04/05/2016  . Arthritis of right knee 04/05/2016  . Tension headache 10/08/2015  . Allergic rhinitis 05/13/2015  . Arthritis of neck 10/02/2013  . Annual physical exam 10/02/2013  . Screening examination for infectious disease 10/02/2013  . Screening cholesterol level 10/02/2013  . Contusion of head 08/25/2013  . Neck pain 08/25/2013  . 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 Skin tag 04/24/2013  . Adjustment reaction 04/24/2013  . Breast cancer of upper-outer quadrant of left female breast ([place])   . 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 Palpitations 11/28/2010  . Kidney stones 11/28/2010  . Anxiety associated with depression 11/28/2010   5:15 PM, 01/14/20 Jerene Pitch, DPT Physical Therapy with Valerie Roys  [facility]  [phone] office  [place] [address] [place], [place], [address] Phone: [phone]   Fax:  [phone]  Name: AMBRY AUZENNE MRN: [id number] Date of Birth: 07-Oct-1960

## 2020-01-15 ENCOUNTER — Telehealth (HOSPITAL_COMMUNITY): Payer: Self-pay | Admitting: Physical Therapy

## 2020-01-15 NOTE — Telephone Encounter (Signed)
Patient requested to be transferred to [facility] office for PT. Please d/c from our office - [facility] can get her in next week

## 2020-01-21 ENCOUNTER — Ambulatory Visit (HOSPITAL_COMMUNITY): Payer: BC Managed Care – PPO

## 2020-01-22 ENCOUNTER — Other Ambulatory Visit: Payer: Self-pay

## 2020-01-22 ENCOUNTER — Encounter: Payer: Self-pay | Admitting: Physical Therapy

## 2020-01-22 ENCOUNTER — Ambulatory Visit: Payer: BC Managed Care – PPO | Attending: Otolaryngology | Admitting: Physical Therapy

## 2020-01-22 DIAGNOSIS — Z9181 History of falling: Secondary | ICD-10-CM | POA: Insufficient documentation

## 2020-01-22 DIAGNOSIS — R262 Difficulty in walking, not elsewhere classified: Secondary | ICD-10-CM | POA: Diagnosis present

## 2020-01-22 DIAGNOSIS — R2689 Other abnormalities of gait and mobility: Secondary | ICD-10-CM

## 2020-01-22 NOTE — Therapy (Signed)
[place], [place], [address] Phone: [phone]   Fax:  [phone]  Physical Therapy Treatment  Patient Details  Name: Mandy Vargas MRN: [id number] Date of Birth: 1961/05/28 Referring Provider (PT): Leta Baptist   Encounter Date: 01/22/2020  PT End of Session - 01/22/20 1526    Visit Number  2    Number of Visits  12    Date for PT Re-Evaluation  02/25/20    Authorization Type  BCBS no auth or VL    PT Start Time  1515    PT Stop Time  1603    PT Time Calculation (min)  48 min    Activity Tolerance  Patient tolerated treatment well    Behavior During Therapy  [facility] for tasks assessed/performed       Past Medical History:  Diagnosis Date  . Anemia   . 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 Bruises easily   . Depression   . Nephrolithiasis    Status post lithotripsy  . Palpitations   . Wears glasses     Past Surgical History:  Procedure Laterality Date  . BREAST BIOPSY  12/22/11   DCIS w/calcifications  . BREAST LUMPECTOMY Left 02/19/12   Left breast, DCIS, ER/PR+  . DILATION AND CURETTAGE OF UTERUS  1993  . KIDNEY STONE SURGERY      There were no vitals filed for this visit.  Subjective Assessment - 01/22/20 1525    Subjective  COVID-19 screening performed upon arrival. Patient reports some near falls this week.    Pertinent History  depression, memory issues    Patient Stated Goals  to improve her balance and to be able to walk better    Currently in Pain?  No/denies         [facility] PT Assessment - 01/22/20 0001      Assessment   Medical Diagnosis  balance issues    Referring Provider (PT)  Su Teoh    Prior Therapy  no      Precautions   Precautions  None      Restrictions   Weight Bearing Restrictions  No      Balance Screen   Has the patient fallen in the past 6 months  No      Standardized Balance Assessment   Standardized Balance Assessment  Berg Balance Test      Berg  Balance Test   Sit to Stand  Able to stand  independently using hands    Standing Unsupported  Able to stand safely 2 minutes    Sitting with Back Unsupported but Feet Supported on Floor or Stool  Able to sit safely and securely 2 minutes    Stand to Sit  Sits safely with minimal use of hands    Transfers  Able to transfer safely, minor use of hands    Standing Unsupported with Eyes Closed  Able to stand 10 seconds safely    Standing Unsupported with Feet Together  Able to place feet together independently and stand 1 minute safely    From Standing, Reach Forward with Outstretched Arm  Can reach confidently >25 cm (10")    From Standing Position, Pick up Object from Floor  Able to pick up shoe safely and easily    From Standing Position, Turn to Look Behind Over each Shoulder  Looks behind from both sides and weight shifts well    Turn 360 Degrees  Able to  turn 360 degrees safely in 4 seconds or less    Standing Unsupported, Alternately Place Feet on Step/Stool  Able to stand independently and safely and complete 8 steps in 20 seconds    Standing Unsupported, One Foot in Front  Able to take small step independently and hold 30 seconds    Standing on One Leg  Able to lift leg independently and hold 5-10 seconds    Total Score  52                    [facility] Adult PT Treatment/Exercise - 01/22/20 0001      Exercises   Exercises  Knee/Hip      Knee/Hip Exercises: Aerobic   Nustep  level 3 x10 mins      Knee/Hip Exercises: Standing   Hip Abduction  AROM;Both;2 sets;10 reps;Knee straight    Rocker Board  3 minutes   calf stretching     Knee/Hip Exercises: Seated   Sit to Sand  1 set;10 reps;without UE support   staggered stance         Balance Exercises - 01/22/20 1610      Balance Exercises: Standing   Standing Eyes Opened  Narrow base of support (BOS);Foam/compliant surface;2 reps;Cognitive challenge;Time   1x2 mins   Tandem Stance  Foam/compliant  surface;Intermittent upper extremity support;2 reps;Time   1 min each   Rockerboard  Anterior/posterior;Lateral;EO;Other time (comment)   2 mins each   Heel Raises  20 reps    Toe Raise  20 reps          PT Short Term Goals - 01/14/20 1656      PT SHORT TERM GOAL #1   Title  Patient will report at least 25% improvement in overall symptoms and/or functional ability.    Time  3    Period  Weeks    Status  New    Target Date  02/04/20      PT SHORT TERM GOAL #2   Title  Patient will be able to stand on either leg for at least 10 seconds on the floor to demonstrate improved static balance.    Time  3    Period  Weeks    Status  New    Target Date  02/04/20      PT SHORT TERM GOAL #3   Title  Patient will be independent in self management strategies to improve quality of life and functional outcomes.    Time  3    Period  Weeks    Status  New    Target Date  02/04/20        PT Long Term Goals - 01/14/20 1657      PT LONG TERM GOAL #1   Title  Patient will report at least 50% improvement in overall symptoms and/or functional ability.    Time  6    Period  Weeks    Status  New    Target Date  02/25/20      PT LONG TERM GOAL #2   Title  Patient will report improved ability to walk down stairs of her deck to improve functional mobility and decreased fear of falling at home.    Time  6    Period  Weeks    Status  New    Target Date  02/25/20      PT LONG TERM GOAL #3   Title  Patient will be able to stand on floow with either  leg posterior eyes closed with feet in tandem for at least 10 seconds to demonstrate improved static balance.    Time  6    Period  Weeks    Status  New    Target Date  02/25/20            Plan - 01/22/20 1615    Clinical Impression Statement  Patient arrives feeling good but with some intermittent missteps and near falls this week. Patient's Berg Balance Scale score of 52/56 categorizes her as a low fall risk. Patient noted with good  use of ankle strategies during balance activities; more challenged with tandem stance on air ex requiring contact guard. Patient and PT discussed plan of care and discussed HEP to which patient reported understanding.    Personal Factors and Comorbidities  Comorbidity 1;Comorbidity 2    Comorbidities  depression, memory issues    Examination-Activity Limitations  Locomotion Level;Squat    Stability/Clinical Decision Making  Stable/Uncomplicated    Clinical Decision Making  Low    Rehab Potential  Good    PT Frequency  2x / week    PT Duration  6 weeks    PT Treatment/Interventions  ADLs/Self Care Home Management;Neuromuscular re-education;Patient/family education;Therapeutic activities;Therapeutic exercise;Balance training;Functional mobility training;Stair training;Gait training;Biofeedback    PT Next Visit Plan  static balance and dynamic balance challenges    PT Home Exercise Plan  see patient education section    Consulted and Agree with Plan of Care  Patient       Patient will benefit from skilled therapeutic intervention in order to improve the following deficits and impairments:  Decreased balance, Decreased activity tolerance  Visit Diagnosis: History of falling  Balance problems  Difficulty in walking, not elsewhere classified     Problem List Patient Active Problem List   Diagnosis Date Noted  . Pancolitis ([place]) 04/04/2019  . Memory problem 02/19/2017  . Lung nodule 04/05/2016  . BMI 26.0-26.9,adult 04/05/2016  . Arthritis of right knee 04/05/2016  . Tension headache 10/08/2015  . Allergic rhinitis 05/13/2015  . Arthritis of neck 10/02/2013  . Annual physical exam 10/02/2013  . Screening examination for infectious disease 10/02/2013  . Screening cholesterol level 10/02/2013  . Contusion of head 08/25/2013  . Neck pain 08/25/2013  . 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 Skin tag 04/24/2013  . Adjustment reaction 04/24/2013  . Breast cancer of  upper-outer quadrant of left female breast ([place])   . 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 Palpitations 11/28/2010  . Kidney stones 11/28/2010  . Anxiety associated with depression 11/28/2010    Gabriela Eves PT, DPT 01/22/2020, 4:20 PM  [facility] [address] [place], [place], [address] Phone: [phone]   Fax:  [phone]  Name: SANDRAL STEINBRECHER MRN: [id number] Date of Birth: [birth year]

## 2020-01-24 ENCOUNTER — Other Ambulatory Visit: Payer: Self-pay | Admitting: Family Medicine

## 2020-01-24 DIAGNOSIS — F5104 Psychophysiologic insomnia: Secondary | ICD-10-CM

## 2020-01-27 ENCOUNTER — Ambulatory Visit: Payer: BC Managed Care – PPO | Admitting: Physical Therapy

## 2020-01-28 ENCOUNTER — Encounter (HOSPITAL_COMMUNITY): Payer: BC Managed Care – PPO | Admitting: Physical Therapy

## 2020-01-29 ENCOUNTER — Encounter: Payer: Self-pay | Admitting: Physical Therapy

## 2020-01-29 ENCOUNTER — Ambulatory Visit: Payer: BC Managed Care – PPO | Admitting: Physical Therapy

## 2020-01-29 ENCOUNTER — Other Ambulatory Visit: Payer: Self-pay

## 2020-01-29 DIAGNOSIS — Z9181 History of falling: Secondary | ICD-10-CM

## 2020-01-29 DIAGNOSIS — R2689 Other abnormalities of gait and mobility: Secondary | ICD-10-CM

## 2020-01-29 DIAGNOSIS — R262 Difficulty in walking, not elsewhere classified: Secondary | ICD-10-CM

## 2020-01-29 NOTE — Therapy (Signed)
[facility] [facility] [place], [place], [address] Phone: [phone]   Fax:  [phone]  Physical Therapy Treatment  Patient Details  Name: Mandy Vargas MRN: [id number] Date of Birth: 1961/06/21 Referring Provider (PT): Leta Baptist   Encounter Date: 01/29/2020  PT End of Session - 01/29/20 1603    Visit Number  3    Number of Visits  12    Date for PT Re-Evaluation  02/25/20    Authorization Type  BCBS no auth or VL    PT Start Time  1601    PT Stop Time  1642    PT Time Calculation (min)  41 min    Activity Tolerance  Patient tolerated treatment well    Behavior During Therapy  [facility] for tasks assessed/performed       Past Medical History:  Diagnosis Date  . Anemia   . 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 Bruises easily   . Depression   . Nephrolithiasis    Status post lithotripsy  . Palpitations   . Wears glasses     Past Surgical History:  Procedure Laterality Date  . BREAST BIOPSY  12/22/11   DCIS w/calcifications  . BREAST LUMPECTOMY Left 02/19/12   Left breast, DCIS, ER/PR+  . DILATION AND CURETTAGE OF UTERUS  1993  . KIDNEY STONE SURGERY      There were no vitals filed for this visit.  Subjective Assessment - 01/29/20 1602    Subjective  COVID-19 screening performed upon arrival. Patient reports off balance feeling when she is walking.    Pertinent History  depression, memory issues    Patient Stated Goals  to improve her balance and to be able to walk better    Currently in Pain?  No/denies         [facility] PT Assessment - 01/29/20 0001      Assessment   Medical Diagnosis  balance issues    Referring Provider (PT)  Su Teoh    Prior Therapy  no      Precautions   Precautions  None      Restrictions   Weight Bearing Restrictions  No                    [facility] Adult PT Treatment/Exercise - 01/29/20 0001      Knee/Hip Exercises: Aerobic   Nustep  L4 x10 min      Knee/Hip  Exercises: Seated   Sit to Sand  10 reps;without UE support   staggered stance for BLE         Balance Exercises - 01/29/20 1631      Balance Exercises: Standing   Standing Eyes Closed  Narrow base of support (BOS);Foam/compliant surface;Time   x19min   Tandem Stance  Eyes open;Intermittent upper extremity support;Time   x2 min   SLS  Solid surface;Intermittent upper extremity support;1 rep;Time   x1 min each   Standing, One Foot on a Step  Eyes open;6 inch;Time   x2 min each   Rockerboard  Anterior/posterior;EO;Intermittent UE support   cognitive challenges   Balance Beam  EO static x2 min with cognitive challenges    Marching  Solid surface;Intermittent upper extremity assist;Static;15 reps   hold for modified SLS   Heel Raises  20 reps    Toe Raise  20 reps    Other Standing Exercises  Balance pod touch forward and lateral BLE    Other Standing Exercises  Comments  Four square stepping x2 min          PT Short Term Goals - 01/29/20 1655      PT SHORT TERM GOAL #1   Title  Patient will report at least 25% improvement in overall symptoms and/or functional ability.    Time  3    Period  Weeks    Status  Achieved    Target Date  02/04/20      PT SHORT TERM GOAL #2   Title  Patient will be able to stand on either leg for at least 10 seconds on the floor to demonstrate improved static balance.    Time  3    Period  Weeks    Status  On-going    Target Date  02/04/20      PT SHORT TERM GOAL #3   Title  Patient will be independent in self management strategies to improve quality of life and functional outcomes.    Time  3    Period  Weeks    Status  On-going    Target Date  02/04/20        PT Long Term Goals - 01/29/20 1655      PT LONG TERM GOAL #1   Title  Patient will report at least 50% improvement in overall symptoms and/or functional ability.    Time  6    Period  Weeks    Status  On-going      PT LONG TERM GOAL #2   Title  Patient will report  improved ability to walk down stairs of her deck to improve functional mobility and decreased fear of falling at home.    Time  6    Period  Weeks    Status  On-going      PT LONG TERM GOAL #3   Title  Patient will be able to stand on floow with either leg posterior eyes closed with feet in tandem for at least 10 seconds to demonstrate improved static balance.    Time  6    Period  Weeks    Status  On-going            Plan - 01/29/20 1652    Clinical Impression Statement  Patient presented in clinic with no reports of any recent LOB. Patient progressed to more narrow BOS balance activities with appropriate ankle strategies noted on all surfaces. Intermittant UE support required with balance activties. SBA required by PTA during cognitive challenged static beat stance as well as balance pod touches.    Personal Factors and Comorbidities  Comorbidity 1;Comorbidity 2    Comorbidities  depression, memory issues    Examination-Activity Limitations  Locomotion Level;Squat    Stability/Clinical Decision Making  Stable/Uncomplicated    Rehab Potential  Good    PT Frequency  2x / week    PT Duration  6 weeks    PT Treatment/Interventions  ADLs/Self Care Home Management;Neuromuscular re-education;Patient/family education;Therapeutic activities;Therapeutic exercise;Balance training;Functional mobility training;Stair training;Gait training;Biofeedback    PT Next Visit Plan  static balance and dynamic balance challenges    PT Home Exercise Plan  see patient education section    Consulted and Agree with Plan of Care  Patient       Patient will benefit from skilled therapeutic intervention in order to improve the following deficits and impairments:  Decreased balance, Decreased activity tolerance  Visit Diagnosis: History of falling  Balance problems  Difficulty in walking, not elsewhere classified  Problem List Patient Active Problem List   Diagnosis Date Noted  . Pancolitis  ([place]) 04/04/2019  . Memory problem 02/19/2017  . Lung nodule 04/05/2016  . BMI 26.0-26.9,adult 04/05/2016  . Arthritis of right knee 04/05/2016  . Tension headache 10/08/2015  . Allergic rhinitis 05/13/2015  . Arthritis of neck 10/02/2013  . Annual physical exam 10/02/2013  . Screening examination for infectious disease 10/02/2013  . Screening cholesterol level 10/02/2013  . Contusion of head 08/25/2013  . Neck pain 08/25/2013  . 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 Skin tag 04/24/2013  . Adjustment reaction 04/24/2013  . Breast cancer of upper-outer quadrant of left female breast ([place])   . 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 Palpitations 11/28/2010  . Kidney stones 11/28/2010  . Anxiety associated with depression 11/28/2010    Standley Brooking, PTA 01/29/2020, 4:58 PM  [facility] [address] [place], [place], [address] Phone: [phone]   Fax:  [phone]  Name: TAKYLAH POPOVICH MRN: [id number] Date of Birth: [birth year]

## 2020-02-03 ENCOUNTER — Ambulatory Visit: Payer: BC Managed Care – PPO | Admitting: Physical Therapy

## 2020-02-03 ENCOUNTER — Encounter: Payer: Self-pay | Admitting: Physical Therapy

## 2020-02-03 DIAGNOSIS — Z9181 History of falling: Secondary | ICD-10-CM | POA: Diagnosis not present

## 2020-02-03 DIAGNOSIS — R2689 Other abnormalities of gait and mobility: Secondary | ICD-10-CM

## 2020-02-03 DIAGNOSIS — R262 Difficulty in walking, not elsewhere classified: Secondary | ICD-10-CM

## 2020-02-03 NOTE — Therapy (Addendum)
[facility] [place], [place], [address] Phone: [phone]   Fax:  [phone]  Physical Therapy Treatment PHYSICAL THERAPY DISCHARGE SUMMARY  Visits from Start of Care: 4  Current functional level related to goals / functional outcomes: See below   Remaining deficits: See goals   Education / Equipment: HEP Plan: Patient agrees to discharge.  Patient goals were not met. Patient is being discharged due to not returning since the last visit.  ?????  Gabriela Eves, PT, DPT 08/10/20    Patient Details  Name: ELEXA KIVI MRN: [id number] Date of Birth: [birth year] Referring Provider (PT): Leta Baptist   Encounter Date: 02/03/2020  PT End of Session - 02/03/20 1603    Visit Number  4    Number of Visits  12    Date for PT Re-Evaluation  02/25/20    Authorization Type  [employer] no auth or VL    PT Start Time  1602    PT Stop Time  1645    PT Time Calculation (min)  43 min    Activity Tolerance  Patient tolerated treatment well    Behavior During Therapy  [facility] for tasks assessed/performed       Past Medical History:  Diagnosis Date  . Anemia   . 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 Bruises easily   . Depression   . Nephrolithiasis    Status post lithotripsy  . Palpitations   . Wears glasses     Past Surgical History:  Procedure Laterality Date  . BREAST BIOPSY  12/22/11   DCIS w/calcifications  . BREAST LUMPECTOMY Left 02/19/12   Left breast, DCIS, ER/PR+  . DILATION AND CURETTAGE OF UTERUS  1993  . KIDNEY STONE SURGERY      There were no vitals filed for this visit.  Subjective Assessment - 02/03/20 1603    Subjective  COVID-19 screening performed upon arrival. Patient reports maybe one time of LOB since last PT session but no falls.    Pertinent History  depression, memory issues    Patient Stated Goals  to improve her balance and to be able to walk better    Currently in Pain?  No/denies          [facility] PT Assessment - 02/03/20 0001      Assessment   Medical Diagnosis  balance issues    Referring Provider (PT)  Su Teoh    Prior Therapy  no      Precautions   Precautions  None      Restrictions   Weight Bearing Restrictions  No                    [facility] Adult PT Treatment/Exercise - 02/03/20 0001      Knee/Hip Exercises: Aerobic   Nustep  L4 x11 min      Knee/Hip Exercises: Standing   Heel Raises  Both;20 reps    Heel Raises Limitations  B toe raise x20 reps      Knee/Hip Exercises: Seated   Other Seated Knee/Hip Exercises  B ankle eversion red theraband x20 reps    Other Seated Knee/Hip Exercises  B ankle ABCs x1 rep each    Sit to Sand  10 reps;without UE support          Balance Exercises - 02/03/20 1622      Balance Exercises: Standing   Standing Eyes Closed  Solid surface;3 reps;Time   x  max hold; patient able to complete for approxmately 5 sec   Tandem Stance  Eyes open;Intermittent upper extremity support;2 reps;30 secs    SLS  Solid surface;Intermittent upper extremity support;1 rep;Time    SLS Time  x max holds which was approximately 5 sec    Rockerboard  Anterior/posterior;EO;20 seconds;Intermittent UE support   x3 reps   Tandem Gait  Forward;Intermittent upper extremity support;Foam/compliant surface;3 reps    Sidestepping  Foam/compliant support;Upper extremity support;3 reps        PT Education - 02/03/20 1643    Education Details  HEP- B ankle ABCs, ankle eversion with red theraband    Person(s) Educated  Patient    Methods  Explanation;Handout;Demonstration;Verbal cues    Comprehension  Verbalized understanding       PT Short Term Goals - 01/29/20 1655      PT SHORT TERM GOAL #1   Title  Patient will report at least 25% improvement in overall symptoms and/or functional ability.    Time  3    Period  Weeks    Status  Achieved    Target Date  02/04/20      PT SHORT TERM GOAL #2   Title  Patient will be able to  stand on either leg for at least 10 seconds on the floor to demonstrate improved static balance.    Time  3    Period  Weeks    Status  On-going    Target Date  02/04/20      PT SHORT TERM GOAL #3   Title  Patient will be independent in self management strategies to improve quality of life and functional outcomes.    Time  3    Period  Weeks    Status  On-going    Target Date  02/04/20        PT Long Term Goals - 02/03/20 1621      PT LONG TERM GOAL #1   Title  Patient will report at least 50% improvement in overall symptoms and/or functional ability.    Time  6    Period  Weeks    Status  On-going      PT LONG TERM GOAL #2   Title  Patient will report improved ability to walk down stairs of her deck to improve functional mobility and decreased fear of falling at home.    Time  6    Period  Weeks    Status  On-going      PT LONG TERM GOAL #3   Title  Patient will be able to stand on floow with either leg posterior eyes closed with feet in tandem for at least 10 seconds to demonstrate improved static balance.    Time  6    Period  Weeks    Status  On-going            Plan - 02/03/20 1657    Clinical Impression Statement  Patient presented in clinic with only one reported LOB since last week but no new falls. More ankle instability noted with balance activities today. Patient indicated feeling some ankle weakness with balance activities. Patient denied any hip/knee weakness or buckling sensation during balance activities. Patient able to regain any minimal balance losses within clinic. SBA provided by PTA during today's treatment. New HEP provided to assist with ankle instability.    Personal Factors and Comorbidities  Comorbidity 1;Comorbidity 2    Comorbidities  depression, memory issues    Examination-Activity Limitations  Locomotion Level;Squat    Stability/Clinical Decision Making  Stable/Uncomplicated    Rehab Potential  Good    PT Frequency  2x / week    PT  Duration  6 weeks    PT Treatment/Interventions  ADLs/Self Care Home Management;Neuromuscular re-education;Patient/family education;Therapeutic activities;Therapeutic exercise;Balance training;Functional mobility training;Stair training;Gait training;Biofeedback    PT Next Visit Plan  static balance and dynamic balance challenges    PT Home Exercise Plan  see patient education section    Consulted and Agree with Plan of Care  Patient       Patient will benefit from skilled therapeutic intervention in order to improve the following deficits and impairments:  Decreased balance, Decreased activity tolerance  Visit Diagnosis: History of falling  Balance problems  Difficulty in walking, not elsewhere classified     Problem List Patient Active Problem List   Diagnosis Date Noted  . Pancolitis ([place]) 04/04/2019  . Memory problem 02/19/2017  . Lung nodule 04/05/2016  . BMI 26.0-26.9,adult 04/05/2016  . Arthritis of right knee 04/05/2016  . Tension headache 10/08/2015  . Allergic rhinitis 05/13/2015  . Arthritis of neck 10/02/2013  . Annual physical exam 10/02/2013  . Screening examination for infectious disease 10/02/2013  . Screening cholesterol level 10/02/2013  . Contusion of head 08/25/2013  . Neck pain 08/25/2013  . 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 Skin tag 04/24/2013  . Adjustment reaction 04/24/2013  . Breast cancer of upper-outer quadrant of left female breast ([place])   . 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 Palpitations 11/28/2010  . Kidney stones 11/28/2010  . Anxiety associated with depression 11/28/2010    Standley Brooking, PTA 02/03/2020, 5:01 PM  [facility] [address] [place], [place], [address] Phone: [phone]   Fax:  [phone]  Name: SHINIQUA GROSECLOSE MRN: [id number] Date of Birth: Jun 02, 1961

## 2020-02-04 ENCOUNTER — Encounter (HOSPITAL_COMMUNITY): Payer: BC Managed Care – PPO | Admitting: Physical Therapy

## 2020-02-12 ENCOUNTER — Ambulatory Visit: Payer: BC Managed Care – PPO | Admitting: Physical Therapy

## 2020-02-18 ENCOUNTER — Ambulatory Visit: Payer: BC Managed Care – PPO | Admitting: Physical Therapy

## 2020-03-05 ENCOUNTER — Ambulatory Visit (INDEPENDENT_AMBULATORY_CARE_PROVIDER_SITE_OTHER): Payer: BC Managed Care – PPO

## 2020-03-05 ENCOUNTER — Other Ambulatory Visit: Payer: Self-pay

## 2020-03-05 DIAGNOSIS — E538 Deficiency of other specified B group vitamins: Secondary | ICD-10-CM

## 2020-03-05 NOTE — Progress Notes (Signed)
B12 given in left deltoid without difficulty. Pt tolerated well. Next months appt scheduled for 04/05/20 at 3pm

## 2020-03-06 ENCOUNTER — Other Ambulatory Visit: Payer: Self-pay | Admitting: Family Medicine

## 2020-03-06 DIAGNOSIS — R002 Palpitations: Secondary | ICD-10-CM

## 2020-03-08 NOTE — Telephone Encounter (Signed)
That is correct, I was under the impression she was stopping.  Can we call and verify?  If still needing, ok to refill. Otherwise, please dc from her chart

## 2020-03-08 NOTE — Telephone Encounter (Signed)
It looks like pt had wanted to come off the Metoprolol when you did the televisit 11/10/19. Is she still taking this?

## 2020-03-11 ENCOUNTER — Telehealth (INDEPENDENT_AMBULATORY_CARE_PROVIDER_SITE_OTHER): Payer: BC Managed Care – PPO | Admitting: Family

## 2020-03-11 ENCOUNTER — Encounter: Payer: Self-pay | Admitting: Family

## 2020-03-11 DIAGNOSIS — R2689 Other abnormalities of gait and mobility: Secondary | ICD-10-CM

## 2020-03-11 DIAGNOSIS — R519 Headache, unspecified: Secondary | ICD-10-CM

## 2020-03-11 DIAGNOSIS — G8929 Other chronic pain: Secondary | ICD-10-CM

## 2020-03-11 MED ORDER — TOPIRAMATE 25 MG PO TABS: ORAL_TABLET | ORAL | 0 refills | Status: DC

## 2020-03-11 MED ORDER — TOPIRAMATE 50 MG PO TABS: 50.0000 mg | ORAL_TABLET | Freq: Two times a day (BID) | ORAL | 1 refills | Status: DC

## 2020-03-11 NOTE — Progress Notes (Signed)
Virtual Visit via telephone Note Due to COVID-19 pandemic this visit was conducted virtually. This visit type was conducted due to national recommendations for restrictions regarding the COVID-19 Pandemic (e.g. social distancing, sheltering in place) in an effort to limit this patient's exposure and mitigate transmission in our community. All issues noted in this document were discussed and addressed.  A physical exam was not performed with this format.  I connected with Mandy Vargas on 03/11/20 at 12:01 pm  by telephone and verified that I am speaking with the correct person using two identifiers. Mandy Vargas is currently located at home and no one is currently with her during visit. The provider, Evelina Dun, FNP is located in their office at time of visit.  I discussed the limitations, risks, security and privacy concerns of performing an evaluation and management service by telephone and the availability of in person appointments. I also discussed with the patient that there may be a patient responsible charge related to this service. The patient expressed understanding and agreed to proceed.   History and Present Illness:  Pt presents to the office today with headaches. She reports she has had almost a daily headache in her temporal areas over the last year. She reports she saw a ENT and was told it was not related to her sinus. She reports she has intermittent balance issues. She is working with PT. Headache  This is a chronic problem. The current episode started more than 1 year ago. The problem occurs daily. The problem has been unchanged. The pain is located in the temporal region. The pain quality is similar to prior headaches. The quality of the pain is described as aching. The pain is at a severity of 5/10. The pain is moderate. Pertinent negatives include no back pain, blurred vision, nausea, phonophobia or photophobia. The symptoms are aggravated by emotional stress. She has  tried NSAIDs and Excedrin for the symptoms. The treatment provided mild relief.     Review of Systems  Eyes: Negative for blurred vision and photophobia.  Gastrointestinal: Negative for nausea.  Musculoskeletal: Negative for back pain.  Neurological: Positive for headaches.  All other systems reviewed and are negative.    Observations/Objective: No SOB or distress noted, no slurred speech noted  Assessment and Plan: 1. Chronic intractable headache, unspecified headache type - topiramate (TOPAMAX) 25 MG tablet; Take 1 tablet (25 mg total) by mouth 2 (two) times daily for 14 days, THEN 2 tablets (50 mg total) 2 (two) times daily for 14 days.  Dispense: 84 tablet; Refill: 0 - CT Head Wo Contrast; Future - topiramate (TOPAMAX) 50 MG tablet; Take 1 tablet (50 mg total) by mouth 2 (two) times daily.  Dispense: 60 tablet; Refill: 1  2. Balance problem  Given headache has been constant over the last year will do CT scan. She does report changes in her balance and gait. She is nervous about her symptoms.  We will start Topamax today. Discussed possible side effect of fogginess.  If topamax does not work or can not tolerate will need referral to Neurologists.  Follow up with PCP in 1 month     I discussed the assessment and treatment plan with the patient. The patient was provided an opportunity to ask questions and all were answered. The patient agreed with the plan and demonstrated an understanding of the instructions.   The patient was advised to call back or seek an in-person evaluation if the symptoms worsen or if the condition  fails to improve as anticipated.  The above assessment and management plan was discussed with the patient. The patient verbalized understanding of and has agreed to the management plan. Patient is aware to call the clinic if symptoms persist or worsen. Patient is aware when to return to the clinic for a follow-up visit. Patient educated on when it is appropriate  to go to the emergency department.   Time call ended:  12:19pm   I provided 18 minutes of non-face-to-face time during this encounter.    Evelina Dun, FNP

## 2020-03-15 ENCOUNTER — Other Ambulatory Visit: Payer: Self-pay | Admitting: Family

## 2020-03-15 DIAGNOSIS — R519 Headache, unspecified: Secondary | ICD-10-CM

## 2020-03-16 ENCOUNTER — Telehealth: Payer: Self-pay

## 2020-03-16 NOTE — Telephone Encounter (Signed)
Called patient to notify of CT appt no answer LMTCB

## 2020-03-22 ENCOUNTER — Ambulatory Visit (HOSPITAL_COMMUNITY)
Admission: RE | Admit: 2020-03-22 | Discharge: 2020-03-22 | Disposition: A | Discharge status: Home / Self Care | Payer: BC Managed Care – PPO | Source: Ambulatory Visit | Attending: Family | Admitting: Family

## 2020-03-22 ENCOUNTER — Other Ambulatory Visit: Payer: Self-pay

## 2020-03-22 DIAGNOSIS — R519 Headache, unspecified: Secondary | ICD-10-CM | POA: Diagnosis present

## 2020-03-22 DIAGNOSIS — G8929 Other chronic pain: Secondary | ICD-10-CM | POA: Insufficient documentation

## 2020-03-25 ENCOUNTER — Other Ambulatory Visit: Payer: Self-pay | Admitting: Family Medicine

## 2020-03-25 DIAGNOSIS — F418 Other specified anxiety disorders: Secondary | ICD-10-CM

## 2020-03-25 DIAGNOSIS — F321 Major depressive disorder, single episode, moderate: Secondary | ICD-10-CM

## 2020-03-29 ENCOUNTER — Telehealth: Payer: Self-pay | Admitting: Family Medicine

## 2020-03-29 NOTE — Telephone Encounter (Signed)
Pt called wanting to leave Dr Lajuana Ripple a message regarding her depression/anxiety medication. Pt says that she wants to stop taking Zoloft and be put on Lexapro or something else. Pt says Zoloft doesn't seem to be helping her.

## 2020-03-29 NOTE — Telephone Encounter (Signed)
Left message to call back to set up an appt

## 2020-03-30 ENCOUNTER — Ambulatory Visit: Payer: BC Managed Care – PPO | Admitting: Family Medicine

## 2020-03-30 ENCOUNTER — Encounter: Payer: Self-pay | Admitting: Family Medicine

## 2020-03-30 ENCOUNTER — Other Ambulatory Visit: Payer: Self-pay

## 2020-03-30 VITALS — BP 95/55 | HR 77 | Temp 98.1°F | Ht 61.0 in | Wt 138.0 lb

## 2020-03-30 DIAGNOSIS — F418 Other specified anxiety disorders: Secondary | ICD-10-CM | POA: Diagnosis not present

## 2020-03-30 DIAGNOSIS — F321 Major depressive disorder, single episode, moderate: Secondary | ICD-10-CM | POA: Diagnosis not present

## 2020-03-30 MED ORDER — BUSPIRONE HCL 10 MG PO TABS: ORAL_TABLET | ORAL | 2 refills | Status: DC

## 2020-03-30 MED ORDER — BUPROPION HCL ER (XL) 300 MG PO TB24: 300.0000 mg | ORAL_TABLET | Freq: Every day | ORAL | 1 refills | Status: AC

## 2020-03-30 MED ORDER — ESCITALOPRAM OXALATE 10 MG PO TABS: ORAL_TABLET | ORAL | 5 refills | Status: DC

## 2020-03-30 MED ORDER — SERTRALINE HCL 100 MG PO TABS: ORAL_TABLET | ORAL | 0 refills | Status: DC

## 2020-03-30 NOTE — Telephone Encounter (Signed)
Will be addressed at office visit today

## 2020-03-30 NOTE — Progress Notes (Signed)
Subjective: CC: Follow-up depression PCP: [name], DO MVH:QIONG Mandy Vargas is a 59 y.o. female presenting to clinic today for:  1.  Depression Patient with ongoing depression and anxiety despite Wellbutrin 300 mg daily, BuSpar 10 mg every morning and 20 mg every afternoon, Zoloft 200 mg daily.  She notes quite a bit of stressors including her mom who has Alzheimer's dementia and is in a facility, financial stressors from a workers comp related incident with her spouse.   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   Bruises easily    Depression    Nephrolithiasis    Status post lithotripsy   Palpitations    Wears glasses     Current Outpatient Medications:    buPROPion (WELLBUTRIN XL) 300 MG 24 hr tablet, Take 1 tablet (300 mg total) by mouth daily., Disp: 90 tablet, Rfl: 1   busPIRone (BUSPAR) 10 MG tablet, TAKE 2 TABLETS (20 MG TOTAL) BY MOUTH 2 (TWO) TIMES DAILY., Disp: 360 tablet, Rfl: 2   diazepam (VALIUM) 2 MG tablet, Take 2 mg by mouth at bedtime., Disp: , Rfl: 0   metoprolol tartrate (LOPRESSOR) 25 MG tablet, Take 1 tablet (25 mg total) by mouth 2 (two) times daily., Disp: 180 tablet, Rfl: 0   Multiple Vitamin (MULTIVITAMIN) tablet, Take 1 tablet by mouth daily., Disp: , Rfl:    Red Yeast Rice Extract (RED YEAST RICE PO), Take by mouth., Disp: , Rfl:    sertraline (ZOLOFT) 100 MG tablet, TAKE 2 TABLETS BY MOUTH EVERY DAY, Disp: 180 tablet, Rfl: 0   azelastine (ASTELIN) 0.1 % nasal spray, Place 1 spray into both nostrils 2 (two) times daily. (Patient not taking: Reported on 03/30/2020), Disp: 30 mL, Rfl: 12   ramelteon (ROZEREM) 8 MG tablet, TAKE 1 TABLET (8 MG TOTAL) BY MOUTH AT BEDTIME. (Patient not taking: Reported on 03/30/2020), Disp: 90 tablet, Rfl: 1   topiramate (TOPAMAX) 25 MG  tablet, Take 1 tablet (25 mg total) by mouth 2 (two) times daily for 14 days, THEN 2 tablets (50 mg total) 2 (two) times daily for 14 days. (Patient not taking: Reported on 03/30/2020), Disp: 84 tablet, Rfl: 0   topiramate (TOPAMAX) 50 MG tablet, Take 1 tablet (50 mg total) by mouth 2 (two) times daily. (Patient not taking: Reported on 03/30/2020), Disp: 60 tablet, Rfl: 1  Current Facility-Administered Medications:    cyanocobalamin ((VITAMIN B-12)) injection 1,000 mcg, 1,000 mcg, Intramuscular, Q30 days, Angelle Isais M, DO, 1,000 mcg at 03/05/20 1506 Social History   Socioeconomic History   Marital status: Married    Spouse name: Not on file   Number of children: Not on file   Years of education: Not on file   Highest education level: Not on file  Occupational History   Not on file  Tobacco Use   Smoking status: Former Smoker    Packs/day: 0.25    Years: 1.00    Pack years: 0.25    Types: Cigarettes    Quit date: 09/12/2011    Years since quitting: 8.5   Smokeless tobacco: Never Used  Vaping Use   Vaping Use: Never used  Substance and Sexual Activity   Alcohol use: No    Alcohol/week: 0.0 standard drinks   Drug  use: No   Sexual activity: Yes    Birth control/protection: Post-menopausal  Other Topics Concern   Not on file  Social History Narrative   Married, 1 son, 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[employer], 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   Social Determinants of Radio broadcast assistant Strain:    Difficulty of Paying Living Expenses:   Haematologist Insecurity:    Worried About Charity fundraiser in the Last Year:    Arboriculturist in the Last Year:   Transportation Needs:    Film/video editor (Medical):    Lack of Transportation (Non-Medical):   Physical Activity:    Days of Exercise per Week:    Minutes of Exercise per Session:   Stress:    Feeling of Stress :   Social Connections:    Frequency of Communication with Friends and Family:     Frequency of Social Gatherings with Friends and Family:    Attends Religious Services:    Active Member of Clubs or Organizations:    Attends Music therapist:    Marital Status:   Intimate Partner Violence:    Fear of Current or Ex-Partner:    Emotionally Abused:    Physically Abused:    Sexually Abused:    Family History  Problem Relation Age of Onset   Colon cancer Mother    Alzheimer's disease Mother    Depression Mother    Breast cancer Sister    Cancer Sister 17       Breast   Bipolar disorder Sister    Heart attack Father 61       Deceased   Cancer Maternal Aunt        Breast   Colon cancer Paternal Grandmother        80'S   Healthy Son    Anesthesia problems Neg Hx     Objective: Office vital signs reviewed. BP (!) 95/55    Pulse 77    Temp 98.1 F (36.7 C)    Ht 5\' 1"  (1.549 m)    Wt 138 lb (62.6 kg)    LMP 12/13/2011    SpO2 96%    BMI 26.07 kg/m   Physical Examination:  General: Awake, alert, well nourished, No acute distress HEENT: Normal, sclera white, MMM Cardio: regular rate and rhythm, S1S2 heard, no murmurs appreciated Pulm: clear to auscultation bilaterally, no wheezes, rhonchi or rales; normal work of breathing on room air Psych: Mood depressed, speech normal. Depression screen [facility] 2/9 03/30/2020 01/02/2020 11/24/2019  Decreased Interest 1 1 2   Down, Depressed, Hopeless 3 1 2   PHQ - 2 Score 4 2 4   Altered sleeping 3 3 3   Tired, decreased energy 3 3 3   Change in appetite 0 3 2  Feeling bad or failure about yourself  0 1 2  Trouble concentrating 1 3 2   Moving slowly or fidgety/restless 0 1 1  Suicidal thoughts 0 0 0  PHQ-9 Score 11 16 17   Difficult doing work/chores Somewhat difficult Somewhat difficult Somewhat difficult  Some encounter information is confidential and restricted. Go to Review Flowsheets activity to see all data.  Some recent data might be hidden    Assessment/ Plan: 59 y.o. female   1.  Current moderate episode of major depressive disorder without prior episode ([place]) Ongoing depression.  She wants to try and switch to a different SSRI.  I discussed her care with our clinical pharmacist today.  I am going to slowly  taper her from the Zoloft and cross-taper her with the Lexapro.  I gave her a pamphlet on the gene site testing.  I think this would be of benefit to the patient.  We will follow-up in 4 weeks, sooner if needed - sertraline (ZOLOFT) 100 MG tablet; Take 1.5 tablets for 2 weeks.  Then decreased to 1 tablet for 2 weeks.  Then 1/2 tablet for 2 weeks.  Dispense: 180 tablet; Refill: 0 - escitalopram (LEXAPRO) 10 MG tablet; Take 1/2 tablet for 2 weeks.  Then increase to 1 tablet daily x4 weeks.  Dispense: 30 tablet; Refill: 5  2. Anxiety associated with depression Okay to increase the BuSpar to 30 mg nightly - busPIRone (BUSPAR) 10 MG tablet; Take 1 tablet (10mg ) every morning and 3 tablets (30mg  every evening) for anxiety.  Dispense: 360 tablet; Refill: 2 - buPROPion (WELLBUTRIN XL) 300 MG 24 hr tablet; Take 1 tablet (300 mg total) by mouth daily.  Dispense: 90 tablet; Refill: 1 - sertraline (ZOLOFT) 100 MG tablet; Take 1.5 tablets for 2 weeks.  Then decreased to 1 tablet for 2 weeks.  Then 1/2 tablet for 2 weeks.  Dispense: 180 tablet; Refill: 0 - escitalopram (LEXAPRO) 10 MG tablet; Take 1/2 tablet for 2 weeks.  Then increase to 1 tablet daily x4 weeks.  Dispense: 30 tablet; Refill: 5   No orders of the defined types were placed in this encounter.  No orders of the defined types were placed in this encounter.    [name], DO [place] [phone]

## 2020-03-30 NOTE — Patient Instructions (Signed)
Zoloft: Take 1.5 tablets daily for 2 weeks. Then take 1 tablet daily for 2 weeks. Then take 1/2 tablet daily for 2 weeks.  Lexapro: Take 1/2 tablet daily for 2 weeks Then take 1 tablet daily for 4 weeks.  Buspirone: Take 1 tablet each morning (10mg ) and 3 tablets each evening (30mg ).  See me back in 4 weeks (phone visit is fine if you prefer)

## 2020-04-05 ENCOUNTER — Ambulatory Visit: Payer: BC Managed Care – PPO

## 2020-04-06 ENCOUNTER — Other Ambulatory Visit: Payer: Self-pay

## 2020-04-06 ENCOUNTER — Ambulatory Visit (INDEPENDENT_AMBULATORY_CARE_PROVIDER_SITE_OTHER): Payer: BC Managed Care – PPO

## 2020-04-06 DIAGNOSIS — E538 Deficiency of other specified B group vitamins: Secondary | ICD-10-CM

## 2020-04-06 NOTE — Progress Notes (Signed)
Cyanocobalamin injection given to right deltoid.  Patient tolerated well. 

## 2020-04-09 ENCOUNTER — Other Ambulatory Visit: Payer: Self-pay | Admitting: Family Medicine

## 2020-04-09 DIAGNOSIS — R002 Palpitations: Secondary | ICD-10-CM

## 2020-04-13 ENCOUNTER — Telehealth: Payer: Self-pay | Admitting: Family Medicine

## 2020-04-13 NOTE — Telephone Encounter (Signed)
Patient aware and declines appt at this time

## 2020-04-13 NOTE — Telephone Encounter (Signed)
I don't do plantar shots but Dr Warrick Parisian does I believe.  Alternatively, could see Dr Irving Shows

## 2020-04-13 NOTE — Telephone Encounter (Signed)
I can do injections for plantar fasciitis as long as she is not allergic to steroids or corticosteroids, just warn her that they are painful going in because the feet are sensitive but they do help. If she is good with this then please schedule an appointment whenever possible.

## 2020-04-14 ENCOUNTER — Encounter: Payer: Self-pay | Admitting: Family Medicine

## 2020-04-14 ENCOUNTER — Other Ambulatory Visit: Payer: Self-pay

## 2020-04-14 ENCOUNTER — Ambulatory Visit: Payer: BC Managed Care – PPO | Admitting: Family Medicine

## 2020-04-14 VITALS — BP 114/70 | HR 73 | Temp 97.8°F | Ht 61.0 in | Wt 137.6 lb

## 2020-04-14 DIAGNOSIS — R413 Other amnesia: Secondary | ICD-10-CM

## 2020-04-14 DIAGNOSIS — F418 Other specified anxiety disorders: Secondary | ICD-10-CM

## 2020-04-14 DIAGNOSIS — G44229 Chronic tension-type headache, not intractable: Secondary | ICD-10-CM | POA: Diagnosis not present

## 2020-04-14 MED ORDER — NURTEC 75 MG PO TBDP: 1.0000 | ORAL_TABLET | Freq: Every day | ORAL | 0 refills | Status: DC | PRN

## 2020-04-14 NOTE — Progress Notes (Deleted)
Mandy Vargas - 60 y.o. female MRN [id number]  Date of birth: 1961/04/09  SUBJECTIVE:  Including CC & ROS.  No chief complaint on file.   Mandy Vargas is a 59 y.o. female that is  ***.  ***   Review of Systems See HPI   HISTORY: Past Medical, Surgical, Social, and Family History Reviewed & Updated per EMR.   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   Bruises easily    Depression    Nephrolithiasis    Status post lithotripsy   Palpitations    Wears glasses     Past Surgical History:  Procedure Laterality Date   BREAST BIOPSY  12/22/11   DCIS w/calcifications   BREAST LUMPECTOMY Left 02/19/12   Left breast, DCIS, ER/PR+   DILATION AND CURETTAGE OF UTERUS  1993   KIDNEY STONE SURGERY      Family History  Problem Relation Age of Onset   Colon cancer Mother    Alzheimer's disease Mother    Depression Mother    Breast cancer Sister    Cancer Sister 21       Breast   Bipolar disorder Sister    Heart attack Father 22       Deceased   Cancer Maternal Aunt        Breast   Colon cancer Paternal Grandmother        57'S   Healthy Son    Anesthesia problems Neg Hx     Social History   Socioeconomic History   Marital status: Married    Spouse name: Not on file   Number of children: Not on file   Years of education: Not on file   Highest education level: Not on file  Occupational History   Not on file  Tobacco Use   Smoking status: Former Smoker    Packs/day: 0.25    Years: 1.00    Pack years: 0.25    Types: Cigarettes    Quit date: 09/12/2011    Years since quitting: 8.5   Smokeless tobacco: Never Used  Vaping Use   Vaping Use: Never used  Substance and Sexual Activity   Alcohol use: No    Alcohol/week: 0.0 standard drinks   Drug use: No   Sexual activity: Yes    Birth control/protection: Post-menopausal  Other Topics Concern     Not on file  Social History Narrative   Married, 1 son, 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[employer], 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   Social Determinants of Radio broadcast assistant Strain:    Difficulty of Paying Living Expenses:   Food Insecurity:    Worried About Charity fundraiser in the Last Year:    Arboriculturist in the Last Year:   Transportation Needs:    Film/video editor (Medical):    Lack of Transportation (Non-Medical):   Physical Activity:    Days of Exercise per Week:    Minutes of Exercise per Session:   Stress:    Feeling of Stress :   Social Connections:    Frequency of Communication with Friends and Family:    Frequency of Social Gatherings with Friends and Family:    Attends Religious Services:    Active Member of Clubs or Organizations:    Attends Archivist Meetings:    Marital Status:   Intimate Production manager  Violence:    Fear of Current or Ex-Partner:    Emotionally Abused:    Physically Abused:    Sexually Abused:      PHYSICAL EXAM:  VS: LMP 12/13/2011  Physical Exam Gen: NAD, alert, cooperative with exam, well-appearing MSK:  ***      ASSESSMENT & PLAN:   No problem-specific Assessment & Plan notes found for this encounter.

## 2020-04-14 NOTE — Progress Notes (Signed)
Subjective: CC: neck pain/ headaches PCP: [name], DO RFF:MBWGY Mandy Vargas is a 59 y.o. female presenting to clinic today for:  1. Neck pain/ headaches Patient with ongoing headaches that she describes as bitemporal headache.  This is associated with occasional posterior head and neck pains.  This initially used to be relieved by Aleve but unfortunately does not work anymore.  Says she has take half a tablet of Excedrin migraine because does have caffeine in it it often will interfere with her sleep.  She continues to have quite a bit of tension and anxiety with relation to her mother's diagnosis of Alzheimer's.  She goes on to ask if there are any blood tests or genetic testing that can be done for Alzheimer's disease.  To summarize she saw Dr. Vikki Ports with neuropsychology previously and did not not feel that she had any evidence of dementia at that time but rather physical manifestations of her underlying depression and anxiety disorder.  She wonders if she should follow-up with him as her memory loss seems to be worse.  She continues to forget people's names, sometimes discussions that she just had with her husband.  Her short-term memory primarily seems to be affected.  She reports chronic fatigue and wonders if she has chronic fatigue syndrome.   ROS: Per HPI  Allergies  Allergen Reactions  . Amitriptyline     Tried 10mg  in past, made her feel "out of it"   Past Medical History:  Diagnosis Date  . Anemia   . 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 Bruises easily   . Depression   . Nephrolithiasis    Status post lithotripsy  . Palpitations   . Wears glasses     Current Outpatient Medications:  .  azelastine (ASTELIN) 0.1 % nasal spray, Place 1 spray into both nostrils 2 (two) times daily. (Patient not taking: Reported on 03/30/2020), Disp: 30 mL, Rfl: 12 .  buPROPion (WELLBUTRIN XL) 300 MG 24 hr tablet, Take 1 tablet (300 mg total) by mouth daily., Disp: 90  tablet, Rfl: 1 .  busPIRone (BUSPAR) 10 MG tablet, Take 1 tablet (10mg ) every morning and 3 tablets (30mg  every evening) for anxiety., Disp: 360 tablet, Rfl: 2 .  diazepam (VALIUM) 2 MG tablet, Take 2 mg by mouth at bedtime., Disp: , Rfl: 0 .  escitalopram (LEXAPRO) 10 MG tablet, Take 1/2 tablet for 2 weeks.  Then increase to 1 tablet daily x4 weeks., Disp: 30 tablet, Rfl: 5 .  metoprolol tartrate (LOPRESSOR) 25 MG tablet, TAKE 1 TABLET BY MOUTH TWICE A DAY, Disp: 180 tablet, Rfl: 0 .  Multiple Vitamin (MULTIVITAMIN) tablet, Take 1 tablet by mouth daily., Disp: , Rfl:  .  Red Yeast Rice Extract (RED YEAST RICE PO), Take by mouth., Disp: , Rfl:  .  sertraline (ZOLOFT) 100 MG tablet, Take 1.5 tablets for 2 weeks.  Then decreased to 1 tablet for 2 weeks.  Then 1/2 tablet for 2 weeks., Disp: 180 tablet, Rfl: 0  Current Facility-Administered Medications:  .  cyanocobalamin ((VITAMIN B-12)) injection 1,000 mcg, 1,000 mcg, Intramuscular, Q30 days, Thelbert Gartin M, DO, 1,000 mcg at 04/06/20 1202 Social History   Socioeconomic History  . Marital status: Married    Spouse name: Not on file  . Number of children: Not on file  . Years of education: Not on file  . Highest education level: Not on file  Occupational History  . Not on file  Tobacco Use  .  Smoking status: Former Smoker    Packs/day: 0.25    Years: 1.00    Pack years: 0.25    Types: Cigarettes    Quit date: 09/12/2011    Years since quitting: 8.5  . Smokeless tobacco: Never Used  Vaping Use  . Vaping Use: Never used  Substance and Sexual Activity  . Alcohol use: No    Alcohol/week: 0.0 standard drinks  . Drug use: No  . Sexual activity: Yes    Birth control/protection: Post-menopausal  Other Topics Concern  . 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 Difficulty of  Paying Living Expenses:   Food Insecurity:   . Worried About Charity fundraiser in the Last Year:   . Arboriculturist in the Last Year:   Transportation Needs:   . Film/video editor (Medical):   Marland Kitchen Lack of Transportation (Non-Medical):   Physical Activity:   . Days of Exercise per Week:   . Minutes of Exercise per Session:   Stress:   . Feeling of Stress :   Social Connections:   . Frequency of Communication with Friends and Family:   . Frequency of Social Gatherings with Friends and Family:   . Attends Religious Services:   . Active Member of Clubs or Organizations:   . Attends Archivist Meetings:   Marland Kitchen Marital Status:   Intimate Partner Violence:   . Fear of Current or Ex-Partner:   . Emotionally Abused:   Marland Kitchen Physically Abused:   . Sexually Abused:    Family History  Problem Relation Age of Onset  . Colon cancer Mother   . Alzheimer's disease Mother   . Depression Mother   . Breast cancer Sister   . Cancer Sister 47       Breast  . Bipolar disorder Sister   . Heart attack Father 87       Deceased  . Cancer Maternal Aunt        Breast  . Colon cancer Paternal Grandmother        42'S  . Healthy Son   . Anesthesia problems Neg Hx     Objective: Office vital signs reviewed. BP 114/70   Pulse 73   Temp 97.8 F (36.6 C)   Ht 5\' 1"  (1.549 m)   Wt 137 lb 9.6 oz (62.4 kg)   LMP 12/13/2011   SpO2 97%   BMI 26.00 kg/m   Physical Examination:  General: Awake, alert, well nourished, No acute distress Psych: Mood stable, speech normal, affect appropriate, patient is pleasant.  She is interactive.  She is got good eye contact.  Does not appear to be responding to internal stimuli. Neuro: Alert and oriented.  No focal neurologic deficits Depression screen [facility] 2/9 04/14/2020 04/14/2020 03/30/2020  Decreased Interest 1 0 1  Down, Depressed, Hopeless 1 0 3  PHQ - 2 Score 2 0 4  Altered sleeping 1 - 3  Tired, decreased energy 1 - 3  Change in appetite 0 - 0    Feeling bad or failure about yourself  1 - 0  Trouble concentrating 1 - 1  Moving slowly or fidgety/restless 1 - 0  Suicidal thoughts 0 - 0  PHQ-9 Score 7 - 11  Difficult doing work/chores Somewhat difficult - Somewhat difficult  Some encounter information is  confidential and restricted. Go to Review Flowsheets activity to see all data.  Some recent data might be hidden    Assessment/ Plan: 59 y.o. female   1. Anxiety associated with depression Not well controlled but we are currently titrating off of Zoloft and onto Lexapro  2. Chronic tension-type headache, not intractable Chronic issue for patient.  Unfortunately refractory to NSAIDs at this point.  I think much of this is caused by tension due to uncontrolled anxiety and depression.  I gave her samples of Nurtec.  If this helps, we may consider trying to use off label.  3. Memory problem Again, previously evaluated by neuropsychology and did not feel that her symptoms were related to a Alzheimer's.  She is interested in pursuing Alzheimer's genetic testing.  I am not sure if she can get this done with the neuropsychologist but will reach out to them to see if perhaps this can be obtained.   No orders of the defined types were placed in this encounter.  No orders of the defined types were placed in this encounter.    [name], DO [place] [phone]

## 2020-04-15 ENCOUNTER — Ambulatory Visit: Payer: BC Managed Care – PPO | Admitting: Family Medicine

## 2020-04-15 ENCOUNTER — Encounter: Payer: Self-pay | Admitting: Family Medicine

## 2020-04-15 ENCOUNTER — Encounter: Payer: Self-pay | Admitting: *Deleted

## 2020-04-15 VITALS — HR 67 | Temp 98.2°F | Ht 61.0 in | Wt 136.0 lb

## 2020-04-15 DIAGNOSIS — M722 Plantar fascial fibromatosis: Secondary | ICD-10-CM

## 2020-04-15 NOTE — Progress Notes (Signed)
   Pulse 67   Temp 98.2 F (36.8 C)   Ht 5\' 1"  (1.549 m)   Wt 136 lb (61.7 kg)   LMP 12/13/2011   SpO2 97%   BMI 25.70 kg/m    Subjective:   Patient ID: Mandy Vargas, female    DOB: 1961-05-21, 59 y.o.   MRN: [id number]  HPI: Mandy Vargas is a 59 y.o. female presenting on 04/15/2020 for Plantar Fasciitis (right foot. requesting injection.)   HPI Patient is coming in complaining of right foot pain on the base of her heel that is coming forward, she has had plantar fasciitis before and was seen recently and diagnosed with that, she is coming in today because she wants an injection for it.  She has been doing ice water bottles of rolling and stretching and trying to wear good shoes.  Relevant past medical, surgical, family and social history reviewed and updated as indicated. Interim medical history since our last visit reviewed. Allergies and medications reviewed and updated.  Review of Systems  Constitutional: Negative for chills and fever.  Eyes: Negative for visual disturbance.  Respiratory: Negative for chest tightness and shortness of breath.   Cardiovascular: Negative for chest pain and leg swelling.  Musculoskeletal: Positive for arthralgias. Negative for back pain and gait problem.  All other systems reviewed and are negative.   Per HPI unless specifically indicated above  Objective:   Pulse 67   Temp 98.2 F (36.8 C)   Ht 5\' 1"  (1.549 m)   Wt 136 lb (61.7 kg)   LMP 12/13/2011   SpO2 97%   BMI 25.70 kg/m   Wt Readings from Last 3 Encounters:  04/15/20 136 lb (61.7 kg)  04/14/20 137 lb 9.6 oz (62.4 kg)  03/30/20 138 lb (62.6 kg)    Physical Exam Vitals and nursing note reviewed.  Constitutional:      General: She is not in acute distress.    Appearance: She is well-developed. She is not diaphoretic.  Eyes:     Conjunctiva/sclera: Conjunctivae normal.  Musculoskeletal:        General: Normal range of motion.  Neurological:     Mental Status: She is  alert.    Right plantar fasciitis injection: Consent form signed. Risk factors of bleeding and infection discussed with patient and patient is agreeable towards injection. Patient prepped with Betadine.  Inferior approach towards injection used. Injected 40 mg of Depo-Medrol and 1 mL of 2% lidocaine. Patient tolerated procedure well and no side effects from noted. Minimal to no bleeding. Simple bandage applied after.    Assessment & Plan:   Problem List Items Addressed This Visit    None    Visit Diagnoses    Plantar fasciitis of right foot    -  Primary   Relevant Medications   methylPREDNISolone acetate (DEPO-MEDROL) injection 40 mg (Start on 04/25/2020 10:15 PM)       Follow up plan: Return if symptoms worsen or fail to improve.  Counseling provided for all of the vaccine components No orders of the defined types were placed in this encounter.   Caryl Pina, MD [facility] 04/25/2020, 10:04 PM

## 2020-04-15 NOTE — Progress Notes (Signed)
PATIENT AWARE, NOTE ROUTED

## 2020-04-21 ENCOUNTER — Other Ambulatory Visit: Payer: Self-pay | Admitting: Family Medicine

## 2020-04-21 DIAGNOSIS — F321 Major depressive disorder, single episode, moderate: Secondary | ICD-10-CM

## 2020-04-21 DIAGNOSIS — F418 Other specified anxiety disorders: Secondary | ICD-10-CM

## 2020-04-22 ENCOUNTER — Encounter: Payer: Self-pay | Admitting: Genetic Counselor

## 2020-04-25 MED ORDER — METHYLPREDNISOLONE ACETATE 80 MG/ML IJ SUSP
40.0000 mg | Freq: Once | INTRAMUSCULAR | Status: DC
Start: 2020-04-25 — End: 2020-12-28

## 2020-04-28 ENCOUNTER — Ambulatory Visit: Payer: BC Managed Care – PPO | Admitting: Family Medicine

## 2020-05-06 ENCOUNTER — Telehealth: Payer: Self-pay | Admitting: Family Medicine

## 2020-05-06 NOTE — Telephone Encounter (Signed)
Patient aware and verbalized understanding. °

## 2020-05-06 NOTE — Telephone Encounter (Signed)
Yes.  I'd be glad to.  That removal was totally indicated.  Does the insurance company have a copy of the office note? If not, we should send it.

## 2020-05-10 ENCOUNTER — Ambulatory Visit: Payer: BC Managed Care – PPO

## 2020-05-13 ENCOUNTER — Encounter: Payer: Self-pay | Admitting: Family Medicine

## 2020-05-18 ENCOUNTER — Telehealth: Payer: Self-pay | Admitting: Family Medicine

## 2020-05-18 NOTE — Telephone Encounter (Signed)
Patient aware and verbalized understanding. Patient aware up front to pick up.

## 2020-05-18 NOTE — Telephone Encounter (Signed)
Yes this was completed. It's likely up front?  Was put in the outgoing basket last week.

## 2020-05-19 ENCOUNTER — Ambulatory Visit: Payer: BC Managed Care – PPO

## 2020-05-20 ENCOUNTER — Other Ambulatory Visit: Payer: Self-pay

## 2020-05-20 ENCOUNTER — Ambulatory Visit (INDEPENDENT_AMBULATORY_CARE_PROVIDER_SITE_OTHER): Payer: BC Managed Care – PPO | Admitting: *Deleted

## 2020-05-20 DIAGNOSIS — E538 Deficiency of other specified B group vitamins: Secondary | ICD-10-CM | POA: Diagnosis not present

## 2020-05-20 NOTE — Progress Notes (Signed)
Patient in today for monthly B12 injection. 1000 mcg given in left deltoid. Patient tolerated well.  °

## 2020-05-25 ENCOUNTER — Other Ambulatory Visit: Payer: Self-pay | Admitting: Family Medicine

## 2020-05-25 DIAGNOSIS — F321 Major depressive disorder, single episode, moderate: Secondary | ICD-10-CM

## 2020-05-25 DIAGNOSIS — F418 Other specified anxiety disorders: Secondary | ICD-10-CM

## 2020-06-09 ENCOUNTER — Encounter: Payer: Self-pay | Admitting: Family Medicine

## 2020-06-09 ENCOUNTER — Other Ambulatory Visit: Payer: Self-pay

## 2020-06-09 ENCOUNTER — Ambulatory Visit (INDEPENDENT_AMBULATORY_CARE_PROVIDER_SITE_OTHER): Payer: BC Managed Care – PPO | Admitting: Family Medicine

## 2020-06-09 VITALS — BP 96/57 | HR 67 | Temp 97.8°F | Ht 61.0 in | Wt 139.4 lb

## 2020-06-09 DIAGNOSIS — F418 Other specified anxiety disorders: Secondary | ICD-10-CM | POA: Diagnosis not present

## 2020-06-09 DIAGNOSIS — E782 Mixed hyperlipidemia: Secondary | ICD-10-CM | POA: Diagnosis not present

## 2020-06-09 DIAGNOSIS — E538 Deficiency of other specified B group vitamins: Secondary | ICD-10-CM | POA: Diagnosis not present

## 2020-06-09 NOTE — Progress Notes (Signed)
Subjective: CC: f/u anxiety PCP: [name], DO TGG:YIRSW [name] is a 59 y.o. female presenting to clinic today for:  1. GAD/ Depression Patient has totally transitioned off of Zoloft and onto 10 mg dose of Lexapro. She continues to take Wellbutrin XL 300 mg daily, BuSpar 10 mg every morning and 30 mg every afternoon. She does report that symptoms seem to be fairly stable. She does not feel that she needs to increase dose of the Lexapro at this time. She would like to go ahead and get all labs done today. She is not fasting.   ROS: Per HPI  Allergies  Allergen Reactions  . Amitriptyline     Tried $Remo'10mg'iedjj$  in past, made her feel "out of it"   Past Medical History:  Diagnosis Date  . Anemia   . 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 Bruises easily   . Depression   . Nephrolithiasis    Status post lithotripsy  . Palpitations   . Wears glasses     Current Outpatient Medications:  .  buPROPion (WELLBUTRIN XL) 300 MG 24 hr tablet, Take 1 tablet (300 mg total) by mouth daily., Disp: 90 tablet, Rfl: 1 .  busPIRone (BUSPAR) 10 MG tablet, Take 1 tablet ($RemoveB'10mg'qcbTRErr$ ) every morning and 3 tablets ($RemoveBe'30mg'XSFJfZfAy$  every evening) for anxiety., Disp: 360 tablet, Rfl: 2 .  diazepam (VALIUM) 2 MG tablet, Take 2 mg by mouth at bedtime., Disp: , Rfl: 0 .  escitalopram (LEXAPRO) 10 MG tablet, Take 1/2 tablet for 2 weeks.  Then increase to 1 tablet daily x4 weeks., Disp: 30 tablet, Rfl: 5 .  metoprolol tartrate (LOPRESSOR) 25 MG tablet, TAKE 1 TABLET BY MOUTH TWICE A DAY, Disp: 180 tablet, Rfl: 0 .  Multiple Vitamin (MULTIVITAMIN) tablet, Take 1 tablet by mouth daily., Disp: , Rfl:  .  Red Yeast Rice Extract (RED YEAST RICE PO), Take by mouth., Disp: , Rfl:  .  Rimegepant Sulfate (NURTEC) 75 MG TBDP, Take 1 tablet by mouth daily as needed (headache)., Disp: 2 tablet, Rfl: 0 .  sertraline (ZOLOFT) 100 MG tablet, Take 1.5 tablets for 2 weeks.  Then decreased to 1 tablet for 2 weeks.  Then 1/2 tablet for 2  weeks., Disp: 180 tablet, Rfl: 0  Current Facility-Administered Medications:  .  cyanocobalamin ((VITAMIN B-12)) injection 1,000 mcg, 1,000 mcg, Intramuscular, Q30 days, Ronnie Doss M, DO, 1,000 mcg at 05/20/20 1550 .  methylPREDNISolone acetate (DEPO-MEDROL) injection 40 mg, 40 mg, Intramuscular, Once, Dettinger, Fransisca Kaufmann, MD Social History   Socioeconomic History  . Marital status: Married    Spouse name: Not on file  . Number of children: Not on file  . Years of education: Not on file  . Highest education level: Not on file  Occupational History  . Not on file  Tobacco Use  . Smoking status: Former Smoker    Packs/day: 0.25    Years: 1.00    Pack years: 0.25    Types: Cigarettes    Quit date: 09/12/2011    Years since quitting: 8.7  . Smokeless tobacco: Never Used  Vaping Use  . Vaping Use: Never used  Substance and Sexual Activity  . Alcohol use: No    Alcohol/week: 0.0 standard drinks  . Drug use: No  . Sexual activity: Yes    Birth control/protection: Post-menopausal  Other Topics Concern  . 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 Difficulty of Paying Living Expenses: Not on file  Food Insecurity:   . Worried About Charity fundraiser in the Last Year: Not on file  . Ran Out of Food in the Last Year: Not on file  Transportation Needs:   . Lack of Transportation (Medical): Not on file  . Lack of Transportation (Non-Medical): Not on file  Physical Activity:   . Days of Exercise per Week: Not on file  . Minutes of Exercise per Session: Not on file  Stress:   . Feeling of Stress : Not on file  Social Connections:   . Frequency of Communication with Friends and Family: Not on file  . Frequency of Social Gatherings with Friends and Family: Not on file  . Attends Religious Services: Not on file  . Active  Member of Clubs or Organizations: Not on file  . Attends Archivist Meetings: Not on file  . Marital Status: Not on file  Intimate Partner Violence:   . Fear of Current or Ex-Partner: Not on file  . Emotionally Abused: Not on file  . Physically Abused: Not on file  . Sexually Abused: Not on file   Family History  Problem Relation Age of Onset  . Colon cancer Mother   . Alzheimer's disease Mother   . Depression Mother   . Breast cancer Sister   . Cancer Sister 44       Breast  . Bipolar disorder Sister   . Heart attack Father 62       Deceased  . Cancer Maternal Aunt        Breast  . Colon cancer Paternal Grandmother        63'S  . Healthy Son   . Anesthesia problems Neg Hx     Objective: Office vital signs reviewed. BP (!) 96/57   Pulse 67   Temp 97.8 F (36.6 C)   Ht 5\' 1"  (1.549 m)   Wt 139 lb 6.4 oz (63.2 kg)   LMP 12/13/2011   SpO2 97%   BMI 26.34 kg/m   Physical Examination:  General: Awake, alert, well nourished, No acute distress HEENT: Normal, sclera white, MMM, no goiter, no thyromegaly Cardio: regular rate and rhythm, S1S2 heard, no murmurs appreciated Pulm: clear to auscultation bilaterally, no wheezes, rhonchi or rales; normal work of breathing on room air Extremities: warm, well perfused, No edema, cyanosis or clubbing; +2 pulses bilaterally Neuro: no tremor Psych: mood stable, speech normal, affect appropriate. Depression screen [facility] 2/9 06/09/2020 04/15/2020 04/14/2020  Decreased Interest 1 1 1   Down, Depressed, Hopeless 1 1 1   PHQ - 2 Score 2 2 2   Altered sleeping 1 - 1  Tired, decreased energy 3 - 1  Change in appetite 1 - 0  Feeling bad or failure about yourself  1 - 1  Trouble concentrating 1 - 1  Moving slowly or fidgety/restless 0 - 1  Suicidal thoughts 0 - 0  PHQ-9 Score 9 - 7  Difficult doing work/chores Somewhat difficult - Somewhat difficult  Some encounter information is confidential and restricted. Go to Review Flowsheets  activity to see all data.  Some recent data might be hidden   Assessment/ Plan: 59 y.o. female   1. Anxiety associated with depression Stable on Lexapro, Buspar.  Does not desire dose change today. - TSH  2. B12 deficiency Next injection 10/12.  Check  labs - CBC - Vitamin B12  3. Mixed hyperlipidemia Check nonfasting labs. - CMP14+EGFR - Lipid Panel   Orders Placed This Encounter  Procedures  . CMP14+EGFR  . Lipid Panel  . TSH  . CBC  . Vitamin B12   No orders of the defined types were placed in this encounter.    [name], DO [place] [phone]

## 2020-06-10 ENCOUNTER — Telehealth: Payer: Self-pay

## 2020-06-10 LAB — CBC
Hematocrit: 37.2 % (ref 34.0–46.6)
Hemoglobin: 12.3 g/dL (ref 11.1–15.9)
MCH: 29.6 pg (ref 26.6–33.0)
MCHC: 33.1 g/dL (ref 31.5–35.7)
MCV: 90 fL (ref 79–97)
Platelets: 182 10*3/uL (ref 150–450)
RBC: 4.15 x10E6/uL (ref 3.77–5.28)
RDW: 12.9 % (ref 11.7–15.4)
WBC: 5.1 10*3/uL (ref 3.4–10.8)

## 2020-06-10 LAB — CMP14+EGFR
ALT: 19 IU/L (ref 0–32)
AST: 22 IU/L (ref 0–40)
Albumin/Globulin Ratio: 2.1 (ref 1.2–2.2)
Albumin: 4.6 g/dL (ref 3.8–4.8)
Alkaline Phosphatase: 68 IU/L (ref 44–121)
BUN/Creatinine Ratio: 27 (ref 12–28)
BUN: 24 mg/dL (ref 8–27)
Bilirubin Total: <0.2 mg/dL (ref 0.0–1.2)
CO2: 25 mmol/L (ref 20–29)
Calcium: 9.6 mg/dL (ref 8.7–10.3)
Chloride: 101 mmol/L (ref 96–106)
Creatinine, Ser: 0.88 mg/dL (ref 0.57–1.00)
GFR calc Af Amer: 82 mL/min/{1.73_m2} (ref >59)
GFR calc non Af Amer: 71 mL/min/{1.73_m2} (ref >59)
Globulin, Total: 2.2 g/dL (ref 1.5–4.5)
Glucose: 87 mg/dL (ref 65–99)
Potassium: 4.5 mmol/L (ref 3.5–5.2)
Sodium: 138 mmol/L (ref 134–144)
Total Protein: 6.8 g/dL (ref 6.0–8.5)

## 2020-06-10 LAB — VITAMIN B12: Vitamin B-12: 550 pg/mL (ref 232–1245)

## 2020-06-10 LAB — LIPID PANEL
Chol/HDL Ratio: 5.5 ratio — ABNORMAL HIGH (ref 0.0–4.4)
Cholesterol, Total: 339 mg/dL — ABNORMAL HIGH (ref 100–199)
HDL: 62 mg/dL (ref >39)
LDL Chol Calc (NIH): 238 mg/dL — ABNORMAL HIGH (ref 0–99)
Triglycerides: 199 mg/dL — ABNORMAL HIGH (ref 0–149)
VLDL Cholesterol Cal: 39 mg/dL (ref 5–40)

## 2020-06-10 LAB — TSH: TSH: 1.89 u[IU]/mL (ref 0.450–4.500)

## 2020-06-10 NOTE — Telephone Encounter (Signed)
Pt called and aware of labs  

## 2020-06-16 ENCOUNTER — Other Ambulatory Visit: Payer: Self-pay | Admitting: Family Medicine

## 2020-06-16 ENCOUNTER — Telehealth: Payer: Self-pay

## 2020-06-16 DIAGNOSIS — F418 Other specified anxiety disorders: Secondary | ICD-10-CM

## 2020-06-16 DIAGNOSIS — F321 Major depressive disorder, single episode, moderate: Secondary | ICD-10-CM

## 2020-06-16 MED ORDER — ESCITALOPRAM OXALATE 20 MG PO TABS: 20.0000 mg | ORAL_TABLET | Freq: Every day | ORAL | 3 refills | Status: DC

## 2020-06-16 MED ORDER — NURTEC 75 MG PO TBDP: 1.0000 | ORAL_TABLET | Freq: Every day | ORAL | 3 refills | Status: DC | PRN

## 2020-06-16 NOTE — Telephone Encounter (Signed)
Lexapro increased to 20mg .  Not sure what she is talking about.  I don't see notes from MMM in there.  Is she referring to Nurtec?

## 2020-06-16 NOTE — Telephone Encounter (Signed)
Patient aware and verbalized understanding. Patient states she is talking about the Nurtec.

## 2020-06-16 NOTE — Telephone Encounter (Signed)
Just had appt 09/29 with you.

## 2020-06-22 ENCOUNTER — Ambulatory Visit: Payer: BC Managed Care – PPO

## 2020-06-24 ENCOUNTER — Other Ambulatory Visit: Payer: Self-pay | Admitting: Family Medicine

## 2020-06-24 DIAGNOSIS — F321 Major depressive disorder, single episode, moderate: Secondary | ICD-10-CM

## 2020-06-24 DIAGNOSIS — F418 Other specified anxiety disorders: Secondary | ICD-10-CM

## 2020-07-02 ENCOUNTER — Ambulatory Visit (INDEPENDENT_AMBULATORY_CARE_PROVIDER_SITE_OTHER): Payer: BC Managed Care – PPO | Admitting: Nurse Practitioner

## 2020-07-02 ENCOUNTER — Encounter: Payer: Self-pay | Admitting: Nurse Practitioner

## 2020-07-02 ENCOUNTER — Telehealth: Payer: Self-pay

## 2020-07-02 DIAGNOSIS — J0101 Acute recurrent maxillary sinusitis: Secondary | ICD-10-CM | POA: Diagnosis not present

## 2020-07-02 MED ORDER — AZITHROMYCIN 250 MG PO TABS: ORAL_TABLET | ORAL | 0 refills | Status: DC

## 2020-07-02 NOTE — Telephone Encounter (Signed)
Ok for note 

## 2020-07-02 NOTE — Telephone Encounter (Signed)
Patient had appointment with [facility]MM today - please advise

## 2020-07-02 NOTE — Progress Notes (Signed)
Virtual Visit via telephone Note Due to COVID-19 pandemic this visit was conducted virtually. This visit type was conducted due to national recommendations for restrictions regarding the COVID-19 Pandemic (e.g. social distancing, sheltering in place) in an effort to limit this patient's exposure and mitigate transmission in our community. All issues noted in this document were discussed and addressed.  A physical exam was not performed with this format.  I connected with Mandy Vargas on 07/02/20 at 12:15 by telephone and verified that I am speaking with the correct person using two identifiers. Mandy Vargas is currently located at [facility] and no one is currently with  her during visit. The provider, Mary-Margaret Hassell Done, FNP is located in their office at time of visit.  I discussed the limitations, risks, security and privacy concerns of performing an evaluation and management service by telephone and the availability of in person appointments. I also discussed with the patient that there may be a patient responsible charge related to this service. The patient expressed understanding and agreed to proceed.   History and Present Illness:   Chief Complaint: Sinusitis   HPI Patient calls in for telephone visit. She says he developed a sore throat on Sunday last week. She was taking alkaselzer plus and throat is some better. Now she has green sinus drainage with headache. denies fever. No body aches. No loss of taste or smell. Denies covid exposure. She has had 2 pfizer vaccines.     Review of Systems  Constitutional: Negative for chills and fever.  HENT: Positive for congestion, sinus pain and sore throat (better).   Respiratory: Positive for cough (productive).   Neurological: Positive for headaches.  Psychiatric/Behavioral: Negative.      Observations/Objective: Alert and oriented- answers all questions appropriately No distress Voice hoarse No cough during  visit   Assessment and Plan: Mandy Vargas in today with chief complaint of Sinusitis   1. Acute recurrent maxillary sinusitis 1. Take meds as prescribed 2. Use a cool mist humidifier especially during the winter months and when heat has been humid. 3. Use saline nose sprays frequently 4. Saline irrigations of the nose can be very helpful if done frequently.  * 4X daily for 1 week*  * Use of a nettie pot can be helpful with this. Follow directions with this* 5. Drink plenty of fluids 6. Keep thermostat turn down low 7.For any cough or congestion  Use plain Mucinex- regular strength or max strength is fine   * Children- consult with Pharmacist for dosing 8. For fever or aces or pains- take tylenol or ibuprofen appropriate for age and weight.  * for fevers greater than 101 orally you may alternate ibuprofen and tylenol every  3 hours.    - azithromycin (ZITHROMAX Z-PAK) 250 MG tablet; As directed  Dispense: 6 tablet; Refill: 0 ( Patient request )   Follow Up Instructions: prn    I discussed the assessment and treatment plan with the patient. The patient was provided an opportunity to ask questions and all were answered. The patient agreed with the plan and demonstrated an understanding of the instructions.   The patient was advised to call back or seek an in-person evaluation if the symptoms worsen or if the condition fails to improve as anticipated.  The above assessment and management plan was discussed with the patient. The patient verbalized understanding of and has agreed to the management plan. Patient is aware to call the clinic if symptoms persist or worsen. Patient is  aware when to return to the clinic for a follow-up visit. Patient educated on when it is appropriate to go to the emergency department.   Time call ended:  12:23  I provided 18 minutes of non-face-to-face time during this encounter.    Mary-Margaret Hassell Done, FNP

## 2020-07-02 NOTE — Telephone Encounter (Signed)
Pt needs a work note to go back to work on 07/05/2020. Please fax to [facility]

## 2020-07-05 NOTE — Telephone Encounter (Signed)
Left message to call back  

## 2020-07-07 ENCOUNTER — Telehealth: Payer: Self-pay

## 2020-07-07 MED ORDER — BENZONATATE 100 MG PO CAPS
100.0000 mg | ORAL_CAPSULE | Freq: Three times a day (TID) | ORAL | 0 refills | Status: DC | PRN
Start: 2020-07-07 — End: 2020-11-04

## 2020-07-07 NOTE — Telephone Encounter (Signed)
Pt had televisit with MMM 07/02/2020--she is asking for rx for a cough. Use [facility]

## 2020-07-08 ENCOUNTER — Other Ambulatory Visit: Payer: Self-pay

## 2020-07-08 ENCOUNTER — Ambulatory Visit (INDEPENDENT_AMBULATORY_CARE_PROVIDER_SITE_OTHER): Payer: BC Managed Care – PPO

## 2020-07-08 DIAGNOSIS — E538 Deficiency of other specified B group vitamins: Secondary | ICD-10-CM

## 2020-07-08 NOTE — Progress Notes (Signed)
Cyanocobalamin injection given to right deltoid.  Patient tolerated well. 

## 2020-08-11 ENCOUNTER — Other Ambulatory Visit: Payer: Self-pay | Admitting: Family Medicine

## 2020-08-11 DIAGNOSIS — R002 Palpitations: Secondary | ICD-10-CM

## 2020-08-13 ENCOUNTER — Other Ambulatory Visit: Payer: Self-pay

## 2020-08-13 ENCOUNTER — Ambulatory Visit (INDEPENDENT_AMBULATORY_CARE_PROVIDER_SITE_OTHER): Payer: BC Managed Care – PPO

## 2020-08-13 DIAGNOSIS — E538 Deficiency of other specified B group vitamins: Secondary | ICD-10-CM

## 2020-08-13 NOTE — Progress Notes (Signed)
B12 given and tolerated well °

## 2020-08-17 ENCOUNTER — Telehealth: Payer: Self-pay

## 2020-08-17 NOTE — Telephone Encounter (Signed)
Hm.  Don't remember recommending against it but she may not have been a candidate at that time.  It is recommending in >65 and if patient has underlying illness OR if patient is in close contact with COVID frequently.  She should be a candidate for immunization at this point.

## 2020-08-17 NOTE — Telephone Encounter (Signed)
Patient aware and verbalizes understanding. 

## 2020-09-14 ENCOUNTER — Telehealth: Payer: Self-pay

## 2020-09-14 NOTE — Telephone Encounter (Signed)
Pt called requesting to speak with a nurse. Has questions about covid booster shot.

## 2020-09-14 NOTE — Telephone Encounter (Signed)
Returned patients call about COVID booster shot.

## 2020-09-15 ENCOUNTER — Ambulatory Visit (INDEPENDENT_AMBULATORY_CARE_PROVIDER_SITE_OTHER): Payer: BC Managed Care – PPO

## 2020-09-15 ENCOUNTER — Other Ambulatory Visit: Payer: Self-pay

## 2020-09-15 DIAGNOSIS — E538 Deficiency of other specified B group vitamins: Secondary | ICD-10-CM

## 2020-09-28 ENCOUNTER — Encounter: Payer: Self-pay | Admitting: Family Medicine

## 2020-09-28 ENCOUNTER — Telehealth (INDEPENDENT_AMBULATORY_CARE_PROVIDER_SITE_OTHER): Payer: BC Managed Care – PPO | Admitting: Family Medicine

## 2020-09-28 DIAGNOSIS — R1012 Left upper quadrant pain: Secondary | ICD-10-CM | POA: Diagnosis not present

## 2020-09-28 NOTE — Progress Notes (Signed)
   Virtual Visit via telephone Note Due to COVID-19 pandemic this visit was conducted virtually. This visit type was conducted due to national recommendations for restrictions regarding the COVID-19 Pandemic (e.g. social distancing, sheltering in place) in an effort to limit this patient's exposure and mitigate transmission in our community. All issues noted in this document were discussed and addressed.  A physical exam was not performed with this format.  I connected with Mandy Vargas on 09/28/20 at 1259 by telephone and verified that I am speaking with the correct person using two identifiers. Mandy Vargas is currently located at home and no one is currently with her during the visit. The provider, Gwenlyn Perking, FNP is located in their office at time of visit.  I discussed the limitations, risks, security and privacy concerns of performing an evaluation and management service by telephone and the availability of in person appointments. I also discussed with the patient that there may be a patient responsible charge related to this service. The patient expressed understanding and agreed to proceed.  CC: abdominal pain  History and Present Illness:  HPI  [name] reports abdominal pain for a few weeks. She reports that the pain seems to have resolved and has not occurred for a week. She describes the pain as a sharp pain in her left upper abdomen. The pain was intermittent and would occur randomly. The pain would last for a few seconds but was moderate to severe when it occurred. The pain did not seem to be related to eating. Nothing seemed to make the pain worse or better, it would go away on its own. She denies fever, nausea, vomiting, diarrhea, constipation, chest pain, blood in stools, or heartburn. The pain was located near her left ribs but was not worse with deep breathing or coughing. Denies back pain.    ROS As per HPI.   Observations/Objective: Alert and oriented x 3. Able to  speak in full sentences without difficulty.   Assessment and Plan: [name] was seen today for abdominal pain.  Diagnoses and all orders for this visit:  Left upper quadrant abdominal pain Seems to be resolved now. Discussed possibility of gas pain, acid reflux, or costocondritis as cause. No alarm symptoms present. Return to office for new or worsening symptoms, or if symptoms persist.   Follow Up Instructions: As needed.     I discussed the assessment and treatment plan with the patient. The patient was provided an opportunity to ask questions and all were answered. The patient agreed with the plan and demonstrated an understanding of the instructions.   The patient was advised to call back or seek an in-person evaluation if the symptoms worsen or if the condition fails to improve as anticipated.  The above assessment and management plan was discussed with the patient. The patient verbalized understanding of and has agreed to the management plan. Patient is aware to call the clinic if symptoms persist or worsen. Patient is aware when to return to the clinic for a follow-up visit. Patient educated on when it is appropriate to go to the emergency department.   Time call ended:  [place], [place]

## 2020-09-28 NOTE — Patient Instructions (Signed)
Abdominal Pain, Adult Pain in the abdomen (abdominal pain) can be caused by many things. Often, abdominal pain is not serious and it gets better with no treatment or by being treated at home. However, sometimes abdominal pain is serious. Your health care provider will ask questions about your medical history and do a physical exam to try to determine the cause of your abdominal pain. Follow these instructions at home: Medicines  Take over-the-counter and prescription medicines only as told by your health care provider.  Do not take a laxative unless told by your health care provider. General instructions  Watch your condition for any changes.  Drink enough fluid to keep your urine pale yellow.  Keep all follow-up visits as told by your health care provider. This is important.   Contact a health care provider if:  Your abdominal pain changes or gets worse.  You are not hungry or you lose weight without trying.  You are constipated or have diarrhea for more than 2-3 days.  You have pain when you urinate or have a bowel movement.  Your abdominal pain wakes you up at night.  Your pain gets worse with meals, after eating, or with certain foods.  You are vomiting and cannot keep anything down.  You have a fever.  You have blood in your urine. Get help right away if:  Your pain does not go away as soon as your health care provider told you to expect.  You cannot stop vomiting.  Your pain is only in areas of the abdomen, such as the right side or the left lower portion of the abdomen. Pain on the right side could be caused by appendicitis.  You have bloody or black stools, or stools that look like tar.  You have severe pain, cramping, or bloating in your abdomen.  You have signs of dehydration, such as: ? Dark urine, very little urine, or no urine. ? Cracked lips. ? Dry mouth. ? Sunken eyes. ? Sleepiness. ? Weakness.  You have trouble breathing or chest  pain. Summary  Often, abdominal pain is not serious and it gets better with no treatment or by being treated at home. However, sometimes abdominal pain is serious.  Watch your condition for any changes.  Take over-the-counter and prescription medicines only as told by your health care provider.  Contact a health care provider if your abdominal pain changes or gets worse.  Get help right away if you have severe pain, cramping, or bloating in your abdomen. This information is not intended to replace advice given to you by your health care provider. Make sure you discuss any questions you have with your health care provider. Document Revised: 10/17/2019 Document Reviewed: 01/06/2019 Elsevier Patient Education  [employer].

## 2020-10-05 ENCOUNTER — Other Ambulatory Visit: Payer: Self-pay | Admitting: Obstetrics and Gynecology

## 2020-10-05 DIAGNOSIS — Z1231 Encounter for screening mammogram for malignant neoplasm of breast: Secondary | ICD-10-CM

## 2020-10-18 ENCOUNTER — Ambulatory Visit: Payer: BC Managed Care – PPO

## 2020-10-20 ENCOUNTER — Ambulatory Visit: Payer: BC Managed Care – PPO

## 2020-10-27 ENCOUNTER — Ambulatory Visit (INDEPENDENT_AMBULATORY_CARE_PROVIDER_SITE_OTHER): Payer: BC Managed Care – PPO | Admitting: Family Medicine

## 2020-10-27 ENCOUNTER — Other Ambulatory Visit: Payer: Self-pay

## 2020-10-27 DIAGNOSIS — E538 Deficiency of other specified B group vitamins: Secondary | ICD-10-CM | POA: Diagnosis not present

## 2020-11-02 ENCOUNTER — Other Ambulatory Visit: Payer: Self-pay | Admitting: Family Medicine

## 2020-11-02 DIAGNOSIS — R002 Palpitations: Secondary | ICD-10-CM

## 2020-11-04 ENCOUNTER — Ambulatory Visit (INDEPENDENT_AMBULATORY_CARE_PROVIDER_SITE_OTHER): Payer: BC Managed Care – PPO | Admitting: Nurse Practitioner

## 2020-11-04 ENCOUNTER — Telehealth: Payer: Self-pay

## 2020-11-04 DIAGNOSIS — B9689 Other specified bacterial agents as the cause of diseases classified elsewhere: Secondary | ICD-10-CM | POA: Insufficient documentation

## 2020-11-04 DIAGNOSIS — J019 Acute sinusitis, unspecified: Secondary | ICD-10-CM | POA: Diagnosis not present

## 2020-11-04 MED ORDER — BENZONATATE 100 MG PO CAPS: 100.0000 mg | ORAL_CAPSULE | Freq: Three times a day (TID) | ORAL | 0 refills | Status: DC | PRN

## 2020-11-04 MED ORDER — AMOXICILLIN-POT CLAVULANATE 875-125 MG PO TABS: 1.0000 | ORAL_TABLET | Freq: Two times a day (BID) | ORAL | 0 refills | Status: DC

## 2020-11-04 NOTE — Progress Notes (Signed)
   Virtual Visit via telephone Note Due to COVID-19 pandemic this visit was conducted virtually. This visit type was conducted due to national recommendations for restrictions regarding the COVID-19 Pandemic (e.g. social distancing, sheltering in place) in an effort to limit this patient's exposure and mitigate transmission in our community. All issues noted in this document were discussed and addressed.  A physical exam was not performed with this format.  I connected with Mandy Vargas on 11/04/20 at  8:30 AM by telephone and verified that I am speaking with the correct person using two identifiers. Mandy Vargas is currently located at home during visit. The provider, Ivy Lynn, NP is located in their office at time of visit.  I discussed the limitations, risks, security and privacy concerns of performing an evaluation and management service by telephone and the availability of in person appointments. I also discussed with the patient that there may be a patient responsible charge related to this service. The patient expressed understanding and agreed to proceed.   History and Present Illness:  Sinusitis This is a recurrent problem. The current episode started 1 to 4 weeks ago. The problem is unchanged. There has been no fever. The pain is moderate. Associated symptoms include congestion, coughing, headaches and sinus pressure. Pertinent negatives include no chills, ear pain, hoarse voice, sore throat or swollen glands. Past treatments include nothing. The treatment provided no relief.      Review of Systems  Constitutional: Negative for chills.  HENT: Positive for congestion and sinus pressure. Negative for ear pain, hoarse voice and sore throat.   Eyes: Negative.   Respiratory: Positive for cough.   Cardiovascular: Negative.   Genitourinary: Negative.   Musculoskeletal: Negative.   Neurological: Positive for headaches.  All other systems reviewed and are  negative.    Observations/Objective: Telephone visit-patient did not sound to be in distress  Assessment and Plan:  Acute bacterial sinusitis Symptoms not well controlled.  Patient reports she had Covid about a month ago and since then her cough, headache, congestion has not yet resolved.  Advised patient to increase fluid, Tylenol for headache, Augmentin ordered 2 tablets daily for 7 days.  And Tessalon Perles for cough.  Follow-up if symptoms worsens or unresolved.  Rx sent to pharmacy.  Follow Up Instructions:   Follow-up with worsening symptoms   I discussed the assessment and treatment plan with the patient. The patient was provided an opportunity to ask questions and all were answered. The patient agreed with the plan and demonstrated an understanding of the instructions.   The patient was advised to call back or seek an in-person evaluation if the symptoms worsen or if the condition fails to improve as anticipated.  The above assessment and management plan was discussed with the patient. The patient verbalized understanding of and has agreed to the management plan. Patient is aware to call the clinic if symptoms persist or worsen. Patient is aware when to return to the clinic for a follow-up visit. Patient educated on when it is appropriate to go to the emergency department.   Time call ended: Oh 8:40 AM  I provided 10 minutes of non-face-to-face time during this encounter.    Ivy Lynn, NP

## 2020-11-04 NOTE — Assessment & Plan Note (Signed)
Symptoms not well controlled.  Patient reports she had Covid about a month ago and since then her cough, headache, congestion has not yet resolved.  Advised patient to increase fluid, Tylenol for headache, Augmentin ordered 2 tablets daily for 7 days.  And Tessalon Perles for cough.  Follow-up if symptoms worsens or unresolved.  Rx sent to pharmacy.

## 2020-11-05 NOTE — Telephone Encounter (Signed)
Letter printed and placed at front for pick up, patient aware.

## 2020-11-17 ENCOUNTER — Ambulatory Visit
Admission: RE | Admit: 2020-11-17 | Discharge: 2020-11-17 | Disposition: A | Discharge status: Home / Self Care | Payer: BC Managed Care – PPO | Source: Ambulatory Visit | Attending: Obstetrics and Gynecology | Admitting: Obstetrics and Gynecology

## 2020-11-17 ENCOUNTER — Other Ambulatory Visit: Payer: Self-pay

## 2020-11-17 DIAGNOSIS — Z1231 Encounter for screening mammogram for malignant neoplasm of breast: Secondary | ICD-10-CM

## 2020-11-18 ENCOUNTER — Other Ambulatory Visit: Payer: Self-pay | Admitting: Obstetrics and Gynecology

## 2020-11-18 DIAGNOSIS — N644 Mastodynia: Secondary | ICD-10-CM

## 2020-11-26 ENCOUNTER — Ambulatory Visit (INDEPENDENT_AMBULATORY_CARE_PROVIDER_SITE_OTHER): Payer: BC Managed Care – PPO

## 2020-11-26 ENCOUNTER — Other Ambulatory Visit: Payer: Self-pay

## 2020-11-26 ENCOUNTER — Other Ambulatory Visit: Payer: Self-pay | Admitting: Family Medicine

## 2020-11-26 DIAGNOSIS — R002 Palpitations: Secondary | ICD-10-CM

## 2020-11-26 DIAGNOSIS — E538 Deficiency of other specified B group vitamins: Secondary | ICD-10-CM | POA: Diagnosis not present

## 2020-11-26 NOTE — Progress Notes (Signed)
B12 injection given to patient and tolerated well.  

## 2020-12-20 ENCOUNTER — Other Ambulatory Visit: Payer: Self-pay

## 2020-12-20 ENCOUNTER — Ambulatory Visit: Payer: BC Managed Care – PPO

## 2020-12-20 ENCOUNTER — Ambulatory Visit
Admission: RE | Admit: 2020-12-20 | Discharge: 2020-12-20 | Disposition: A | Discharge status: Home / Self Care | Payer: BC Managed Care – PPO | Source: Ambulatory Visit | Attending: Obstetrics and Gynecology | Admitting: Obstetrics and Gynecology

## 2020-12-20 DIAGNOSIS — N644 Mastodynia: Secondary | ICD-10-CM

## 2020-12-20 HISTORY — DX: Personal history of irradiation: Z92.3

## 2020-12-28 ENCOUNTER — Other Ambulatory Visit: Payer: Self-pay

## 2020-12-28 ENCOUNTER — Encounter: Payer: Self-pay | Admitting: Family Medicine

## 2020-12-28 ENCOUNTER — Ambulatory Visit: Payer: BC Managed Care – PPO | Admitting: Family Medicine

## 2020-12-28 VITALS — BP 108/58 | HR 64 | Temp 98.2°F | Ht 61.0 in | Wt 143.8 lb

## 2020-12-28 DIAGNOSIS — R413 Other amnesia: Secondary | ICD-10-CM | POA: Diagnosis not present

## 2020-12-28 DIAGNOSIS — Z820 Family history of epilepsy and other diseases of the nervous system: Secondary | ICD-10-CM

## 2020-12-28 DIAGNOSIS — R911 Solitary pulmonary nodule: Secondary | ICD-10-CM | POA: Diagnosis not present

## 2020-12-28 DIAGNOSIS — E538 Deficiency of other specified B group vitamins: Secondary | ICD-10-CM | POA: Diagnosis not present

## 2020-12-28 DIAGNOSIS — R0789 Other chest pain: Secondary | ICD-10-CM | POA: Diagnosis not present

## 2020-12-28 NOTE — Patient Instructions (Addendum)
Dr Chucky May in [place] for psychiatry.    [email or web address]  [employer] is who I used and this at home genetic testing is approved by the FDA. So it's legitimate.   "U.S. Food and Drug Administration (FDA) announced that they have approved at-home genetic testing through the [facility]) test, which tests for genes associated with risk of 10 diseases or conditions, including late-onset Alzheimer's. People will be able to send [employer] a saliva sample and receive their genetic data back through the mail."

## 2020-12-28 NOTE — Progress Notes (Signed)
Subjective: CC: B12 injection PCP: [name], DO GNF:AOZHY H Clinkenbeard is a 60 y.o. female presenting to clinic today for:  1.  B12 deficiency/anxiety, depression, memory loss Patient is here for B12 injection.  Her last B12 level was within normal range.  She continues to have quite a bit of memory issues and is frequently forgetful.  She has been evaluated by both neurology and neuropsychology in the past.  She has been on multiple medications for anxiety and depression but symptoms are often refractory to these.  She has considered psychiatry the past but has really never pursued this for unknown reasons.  Her family history is significant for Alzheimer's disease in her mother and she really wants to pursue genetic testing is not really sure where to start.  2.  Atypical chest pain Patient reports intermittent atypical chest pain that is stabbing in nature.  It occurs in the left upper chest and left upper abdomen.  Symptoms are not accompanied by nausea, vomiting, diaphoresis or shortness of breath.  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 Helane Rima in 2020.  We had actually referred her to cardiology in 2020 for this atypical chest pain but I do not see where she was ever seen   ROS: Per HPI  Allergies  Allergen Reactions  . Amitriptyline     Tried 10mg  in past, made her feel "out of it"   Past Medical History:  Diagnosis Date  . Anemia   . 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 Bruises easily   . Depression   . Nephrolithiasis    Status post lithotripsy  . Palpitations   . Personal history of radiation therapy   . Wears glasses     Current Outpatient Medications:  .  buPROPion (WELLBUTRIN XL) 300 MG 24 hr tablet, Take 1 tablet (300 mg total) by mouth daily., Disp: 90 tablet, Rfl: 1 .  busPIRone (BUSPAR) 10 MG tablet, Take 1 tablet (10mg ) every morning and 3 tablets (30mg  every evening) for anxiety., Disp: 360 tablet, Rfl: 2 .   diazepam (VALIUM) 2 MG tablet, Take 2 mg by mouth at bedtime., Disp: , Rfl: 0 .  escitalopram (LEXAPRO) 20 MG tablet, Take 1 tablet (20 mg total) by mouth daily., Disp: 90 tablet, Rfl: 3 .  metoprolol tartrate (LOPRESSOR) 25 MG tablet, TAKE 1 TABLET BY MOUTH 2 TIMES DAILY. (NEEDS TO BE SEEN BEFORE NEXT REFILL), Disp: 60 tablet, Rfl: 0 .  Multiple Vitamin (MULTIVITAMIN) tablet, Take 1 tablet by mouth daily., Disp: , Rfl:  .  Red Yeast Rice Extract (RED YEAST RICE PO), Take by mouth., Disp: , Rfl:  .  Rimegepant Sulfate (NURTEC) 75 MG TBDP, Take 1 tablet by mouth daily as needed (headache)., Disp: 10 tablet, Rfl: 3  Current Facility-Administered Medications:  .  cyanocobalamin ((VITAMIN B-12)) injection 1,000 mcg, 1,000 mcg, Intramuscular, Q30 days, Meghann [place]ing M, DO, 1,000 mcg at 11/26/20 1534 Social History   Socioeconomic History  . Marital status: Married    Spouse name: Not on file  . Number of children: Not on file  . Years of education: Not on file  . Highest education level: Not on file  Occupational History  . Not on file  Tobacco Use  . Smoking status: Former Smoker    Packs/day: 0.25    Years: 1.00    Pack years: 0.25    Types: Cigarettes    Quit date: 09/12/2011  Years since quitting: 9.3  . Smokeless tobacco: Never Used  Vaping Use  . Vaping Use: Never used  Substance and Sexual Activity  . Alcohol use: No    Alcohol/week: 0.0 standard drinks  . Drug use: No  . Sexual activity: Yes    Birth control/protection: Post-menopausal  Other Topics Concern  . 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 Colon cancer Mother   . Alzheimer's disease Mother   . Depression Mother   . Breast cancer Sister   . Cancer Sister 28       Breast  . Bipolar disorder Sister   . Heart attack Father 30       Deceased  . Cancer Maternal Aunt        Breast  . Colon cancer Paternal Grandmother        49'S  . Healthy Son   . Anesthesia problems Neg Hx     Objective: Office vital signs reviewed. BP (!) 108/58   Pulse 64   Temp 98.2 F (36.8 C)   Ht 5\' 1"  (1.549 m)   Wt 143 lb 12.8 oz (65.2 kg)   LMP 12/13/2011   SpO2 96%   BMI 27.17 kg/m   Physical Examination:  General: Awake, alert, well nourished, No acute distress HEENT: Normal;MMM Cardio: regular rate and rhythm, S1S2 heard, no murmurs appreciated Pulm: clear to auscultation bilaterally, no wheezes, rhonchi or rales; normal work of breathing on room air Extremities: warm, well perfused, No edema, cyanosis or clubbing; +2 pulses bilaterally MSK: Normal gait and station Neuro: Eye contact fair.  No tremor.  Does not appear to be responding to internal stimuli  Assessment/ Plan: 60 y.o. female   B12 deficiency  Lung nodule - Plan: CT Chest Wo Contrast  Memory problem  Family history of Alzheimer's disease  Atypical chest pain - Plan: Ambulatory referral to Cardiology  B12 injection given  She is overdue for her repeat CT lung scan to look at the 7 mm nodule noted in the right upper lung.  This has been ordered  I have given her information about [employer] for genetic testing for Alzheimer's disease.  She has been noted to have mild cognitive impairments and thought that many of these symptoms that she experiences is related to uncontrolled depression and anxiety.  I highly recommend that she consider psychiatry and gave her Dr. Ginny Forth Kaur's information  I have ordered a referral to cardiology for this atypical chest pain she experiences.  No orders of the defined types were  placed in this encounter.  No orders of the defined types were placed in this encounter.  [name], DO [place] [phone]

## 2020-12-31 ENCOUNTER — Other Ambulatory Visit: Payer: BC Managed Care – PPO

## 2021-01-06 ENCOUNTER — Telehealth: Payer: Self-pay

## 2021-01-06 NOTE — Telephone Encounter (Signed)
[facility] does not require a doctor's order.  It can be ordered online by patient.

## 2021-01-06 NOTE — Telephone Encounter (Signed)
I advised patient that I think she is just supposed to call and complete test through [employer]. Do we have to order.

## 2021-01-06 NOTE — Telephone Encounter (Signed)
Patient aware.

## 2021-01-14 ENCOUNTER — Telehealth: Payer: Self-pay

## 2021-01-14 NOTE — Telephone Encounter (Signed)
Called.  See separate note

## 2021-01-19 ENCOUNTER — Telehealth: Payer: Self-pay

## 2021-01-19 NOTE — Telephone Encounter (Signed)
REFERRAL REQUEST Telephone Note  Have you been seen at our office for this problem? yes (Advise that they may need an appointment with their PCP before a referral can be done)  Reason for Referral: Changing her meds Referral discussed with patient: yes Best contact number of patient for referral team: [phone]   Has patient been seen by a specialist for this issue before: yes Patient provider preference for referral:  Patient location preference for referral: [facility]   Patient notified that referrals can take up to a week or longer to process. If they haven't heard anything within a week they should call back and speak with the referral department.   [facility]schalk's pt.  She wants recommendation on [facility]?  Please call pt.

## 2021-01-21 NOTE — Telephone Encounter (Signed)
Should not need referral.  [facility] has been well received by patients so far.  Have her call: [phone]; [place], [place] [address]

## 2021-01-21 NOTE — Telephone Encounter (Signed)
Pt aware.

## 2021-01-26 ENCOUNTER — Ambulatory Visit (INDEPENDENT_AMBULATORY_CARE_PROVIDER_SITE_OTHER): Payer: BC Managed Care – PPO

## 2021-01-26 ENCOUNTER — Other Ambulatory Visit: Payer: Self-pay

## 2021-01-26 DIAGNOSIS — E538 Deficiency of other specified B group vitamins: Secondary | ICD-10-CM

## 2021-01-26 NOTE — Progress Notes (Signed)
B12 given in left deltoid without difficulty. Pt is scheduled for 02/28/21 for next injection.

## 2021-02-15 ENCOUNTER — Ambulatory Visit: Payer: BC Managed Care – PPO | Admitting: Family Medicine

## 2021-02-15 DIAGNOSIS — J342 Deviated nasal septum: Secondary | ICD-10-CM | POA: Diagnosis not present

## 2021-02-15 DIAGNOSIS — R0981 Nasal congestion: Secondary | ICD-10-CM

## 2021-02-15 MED ORDER — IPRATROPIUM BROMIDE 0.03 % NA SOLN: 2.0000 | Freq: Two times a day (BID) | NASAL | 12 refills | Status: DC

## 2021-02-15 NOTE — Progress Notes (Signed)
Telephone visit  Subjective: CC: nasal issues PCP: Mandy Norlander, DO ZLD:JTTSV [name] is a 60 y.o. female calls for telephone consult today. Patient provides verbal consent for consult held via phone.  Due to COVID-19 pandemic this visit was conducted virtually. This visit type was conducted due to national recommendations for restrictions regarding the COVID-19 Pandemic (e.g. social distancing, sheltering in place) in an effort to limit this patient's exposure and mitigate transmission in our community. All issues noted in this document were discussed and addressed.  A physical exam was not performed with this format.   Location of patient: work Location of provider: [facility] Others present for call: none  1. Nasal issues Patient reports deviated septum.  She's tried nettie pot in the past but she could not irrigate it.  She has seen ENT in the past and had endoscopy of her nasal passages and sinuses.  No masses were noted but she had some inflammation.  Unfortunately she was unable to tolerate Flonase so she was switched to Atrovent nasal spray.  She admits that she has not been using this and forgot about it.  She is tried contacting Dr. Benjamine Mola but has not yet gotten a response from him.  No reports of purulence or fevers.   ROS: Per HPI  Allergies  Allergen Reactions  . Amitriptyline     Tried 10mg  in past, made her feel "out of it"   Past Medical History:  Diagnosis Date  . Anemia   . 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 Bruises easily   . Depression   . Nephrolithiasis    Status post lithotripsy  . Palpitations   . Personal history of radiation therapy   . Wears glasses     Current Outpatient Medications:  .  buPROPion (WELLBUTRIN XL) 300 MG 24 hr tablet, Take 1 tablet (300 mg total) by mouth daily., Disp: 90 tablet, Rfl: 1 .  busPIRone (BUSPAR) 10 MG tablet, Take 1 tablet (10mg ) every morning and 3 tablets (30mg  every evening) for anxiety., Disp: 360 tablet,  Rfl: 2 .  escitalopram (LEXAPRO) 20 MG tablet, Take 1 tablet (20 mg total) by mouth daily., Disp: 90 tablet, Rfl: 3 .  metoprolol tartrate (LOPRESSOR) 25 MG tablet, TAKE 1 TABLET BY MOUTH 2 TIMES DAILY. (NEEDS TO BE SEEN BEFORE NEXT REFILL), Disp: 60 tablet, Rfl: 0 .  Multiple Vitamin (MULTIVITAMIN) tablet, Take 1 tablet by mouth daily., Disp: , Rfl:   Current Facility-Administered Medications:  .  cyanocobalamin ((VITAMIN B-12)) injection 1,000 mcg, 1,000 mcg, Intramuscular, Q30 days, Biff Rutigliano M, DO, 1,000 mcg at 01/26/21 1534  Assessment/ Plan: 60 y.o. female   Nasal congestion - Plan: ipratropium (ATROVENT) 0.03 % nasal spray  DNS (deviated nasal septum) - Plan: ipratropium (ATROVENT) 0.03 % nasal spray  Advised to resume use of Atrovent nasal spray.  A new prescription has been sent.  I reviewed with her the details of Dr. Deeann Saint findings back in 2021.  If she does not respond to the nasal spray that I would advise her to get reevaluated with him.  Otherwise, she can follow-up as needed  Start time: 11:35am End time: 11:44am  Total time spent on patient care (including telephone call/ virtual visit): 9 minutes  [place], [place] [phone]

## 2021-02-21 ENCOUNTER — Encounter: Payer: Self-pay | Admitting: Cardiology

## 2021-02-21 ENCOUNTER — Telehealth: Payer: Self-pay | Admitting: Family Medicine

## 2021-02-21 ENCOUNTER — Ambulatory Visit: Payer: BC Managed Care – PPO | Admitting: Cardiology

## 2021-02-21 VITALS — BP 100/70 | HR 70 | Ht 61.0 in | Wt 144.0 lb

## 2021-02-21 DIAGNOSIS — R0789 Other chest pain: Secondary | ICD-10-CM | POA: Diagnosis not present

## 2021-02-21 NOTE — Telephone Encounter (Signed)
Pt wants to get labs. She went to her heart doctor today and was told that her cholesterol was high. Please call back and advise.

## 2021-02-21 NOTE — Progress Notes (Signed)
Clinical Summary Mandy Vargas is a 60 y.o.female seen today as a new consult, referred by Dr Lajuana Ripple for the following medical problems.  Chest pain - no symptoms over the last 2-3 months - sharp pain under left breast and left upper chest, quick stabbing pain 8/10. Would last just a second or two.  - no specific trigger. 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 90 tablet 1   busPIRone (BUSPAR) 10 MG tablet Take 1 tablet (10mg ) every morning and 3 tablets (30mg  every evening) for anxiety. 360 tablet 2   escitalopram (LEXAPRO) 20 MG tablet Take 1 tablet (20 mg total) by mouth daily. 90 tablet 3   ipratropium (ATROVENT) 0.03 % nasal spray Place 2 sprays into both nostrils every 12 (twelve) hours. 30 mL 12   metoprolol tartrate (LOPRESSOR) 25 MG tablet TAKE 1 TABLET BY MOUTH 2 TIMES DAILY. (NEEDS TO BE SEEN BEFORE NEXT REFILL) 60 tablet 0   Multiple Vitamin (MULTIVITAMIN) tablet Take 1 tablet by mouth daily.     Current Facility-Administered Medications  Medication Dose Route Frequency Provider Last Rate Last Admin   cyanocobalamin ((VITAMIN B-12)) injection 1,000 mcg  1,000 mcg Intramuscular Q30 days Ronnie Doss M, DO   1,000 mcg at 01/26/21 1534     Past  Surgical History:  Procedure Laterality Date   BREAST BIOPSY  12/22/11   DCIS w/calcifications   BREAST LUMPECTOMY Left 02/19/12   Left breast, DCIS, ER/PR+   DILATION AND CURETTAGE OF UTERUS  1993   KIDNEY STONE SURGERY       Allergies  Allergen Reactions   Amitriptyline     Tried 10mg  in past, made her feel "out of it"      Family History  Problem Relation Age of Onset   Colon cancer Mother    Alzheimer's disease Mother    Depression Mother    Breast cancer Sister    Cancer Sister 33       Breast   Bipolar disorder Sister    Heart attack Father 11       Deceased   Cancer Maternal Aunt        Breast   Colon cancer Paternal Grandmother        37'S   Healthy Son    Anesthesia problems Neg Hx      Social History Ms. Crutcher reports that she quit smoking about 9 years ago. Her smoking use included cigarettes. She has a 0.25 pack-year smoking history. She has never used smokeless tobacco. Ms. Donalson reports no history of alcohol use.   Review of Systems CONSTITUTIONAL: No weight  loss, fever, chills, weakness or fatigue.  HEENT: Eyes: No visual loss, blurred vision, double vision or yellow sclerae.No hearing loss, sneezing, congestion, runny nose or sore throat.  SKIN: No rash or itching.  CARDIOVASCULAR: per hpi RESPIRATORY: No shortness of breath, cough or sputum.  GASTROINTESTINAL: No anorexia, nausea, vomiting or diarrhea. No abdominal pain or blood.  GENITOURINARY: No burning on urination, no polyuria NEUROLOGICAL: No headache, dizziness, syncope, paralysis, ataxia, numbness or tingling in the extremities. No change in bowel or bladder control.  MUSCULOSKELETAL: No muscle, back pain, joint pain or stiffness.  LYMPHATICS: No enlarged nodes. No history of splenectomy.  PSYCHIATRIC: No history of depression or anxiety.  ENDOCRINOLOGIC: No reports of sweating, cold or heat intolerance. No polyuria or polydipsia.  Marland Kitchen   Physical Examination Today's Vitals    02/21/21 1321  BP: 100/70  Pulse: 70  SpO2: 98%  Weight: 144 lb (65.3 kg)  Height: 5\' 1"  (1.549 m)   Body mass index is 27.21 kg/m.  Gen: resting comfortably, no acute distress HEENT: no scleral icterus, pupils equal round and reactive, no palptable cervical adenopathy,  CV: RRR, no m/r/g, no jvd Resp: Clear to auscultation bilaterally GI: abdomen is soft, non-tender, non-distended, normal bowel sounds, no hepatosplenomegaly MSK: extremities are warm, no edema.  Skin: warm, no rash Neuro:  no focal deficits Psych: appropriate affect   Diagnostic Studies     Assessment and Plan  Chest pain - noncardiac chest pain that has resolved, no plans for additional testing - EKG shows SR, no ischemic changes  2. Hyperlipidemia - we discussed statin indications based on her very high LDL. She wishes to hold off and readdress with her pcp at f/u. Would be a class I guideline to start high dose statin in setting of LDL >190.     F/u just as needed     Arnoldo Lenis, M.D.

## 2021-02-21 NOTE — Patient Instructions (Signed)
Medication Instructions:  Continue all current medications.  Labwork: none  Testing/Procedures: none  Follow-Up: As needed.    Any Other Special Instructions Will Be Listed Below (If Applicable).  If you need a refill on your cardiac medications before your next appointment, please call your pharmacy.  

## 2021-02-22 ENCOUNTER — Other Ambulatory Visit: Payer: Self-pay | Admitting: Family Medicine

## 2021-02-22 ENCOUNTER — Telehealth: Payer: Self-pay | Admitting: Family Medicine

## 2021-02-22 DIAGNOSIS — E538 Deficiency of other specified B group vitamins: Secondary | ICD-10-CM

## 2021-02-22 DIAGNOSIS — E782 Mixed hyperlipidemia: Secondary | ICD-10-CM

## 2021-02-22 NOTE — Progress Notes (Signed)
lipid

## 2021-02-22 NOTE — Telephone Encounter (Signed)
Labs placed.

## 2021-02-23 ENCOUNTER — Other Ambulatory Visit: Payer: Self-pay

## 2021-02-23 ENCOUNTER — Other Ambulatory Visit: Payer: BC Managed Care – PPO

## 2021-02-23 DIAGNOSIS — E782 Mixed hyperlipidemia: Secondary | ICD-10-CM

## 2021-02-23 DIAGNOSIS — E538 Deficiency of other specified B group vitamins: Secondary | ICD-10-CM

## 2021-02-24 ENCOUNTER — Encounter: Payer: Self-pay | Admitting: Family Medicine

## 2021-02-24 LAB — CMP14+EGFR
ALT: 13 IU/L (ref 0–32)
AST: 21 IU/L (ref 0–40)
Albumin/Globulin Ratio: 1.9 (ref 1.2–2.2)
Albumin: 4.4 g/dL (ref 3.8–4.8)
Alkaline Phosphatase: 73 IU/L (ref 44–121)
BUN/Creatinine Ratio: 32 — ABNORMAL HIGH (ref 12–28)
BUN: 25 mg/dL (ref 8–27)
Bilirubin Total: 0.2 mg/dL (ref 0.0–1.2)
CO2: 25 mmol/L (ref 20–29)
Calcium: 9.7 mg/dL (ref 8.7–10.3)
Chloride: 102 mmol/L (ref 96–106)
Creatinine, Ser: 0.78 mg/dL (ref 0.57–1.00)
Globulin, Total: 2.3 g/dL (ref 1.5–4.5)
Glucose: 87 mg/dL (ref 65–99)
Potassium: 5 mmol/L (ref 3.5–5.2)
Sodium: 138 mmol/L (ref 134–144)
Total Protein: 6.7 g/dL (ref 6.0–8.5)
eGFR: 86 mL/min/{1.73_m2} (ref >59)

## 2021-02-24 LAB — CBC
Hematocrit: 39.8 % (ref 34.0–46.6)
Hemoglobin: 12.7 g/dL (ref 11.1–15.9)
MCH: 28.4 pg (ref 26.6–33.0)
MCHC: 31.9 g/dL (ref 31.5–35.7)
MCV: 89 fL (ref 79–97)
Platelets: 169 10*3/uL (ref 150–450)
RBC: 4.47 x10E6/uL (ref 3.77–5.28)
RDW: 12.2 % (ref 11.7–15.4)
WBC: 4.5 10*3/uL (ref 3.4–10.8)

## 2021-02-24 LAB — LIPID PANEL
Chol/HDL Ratio: 4.6 ratio — ABNORMAL HIGH (ref 0.0–4.4)
Cholesterol, Total: 299 mg/dL — ABNORMAL HIGH (ref 100–199)
HDL: 65 mg/dL (ref >39)
LDL Chol Calc (NIH): 217 mg/dL — ABNORMAL HIGH (ref 0–99)
Triglycerides: 98 mg/dL (ref 0–149)
VLDL Cholesterol Cal: 17 mg/dL (ref 5–40)

## 2021-02-24 LAB — TSH: TSH: 2.01 u[IU]/mL (ref 0.450–4.500)

## 2021-02-24 LAB — VITAMIN B12: Vitamin B-12: 1372 pg/mL — ABNORMAL HIGH (ref 232–1245)

## 2021-02-28 ENCOUNTER — Telehealth: Payer: Self-pay

## 2021-02-28 ENCOUNTER — Other Ambulatory Visit: Payer: Self-pay

## 2021-02-28 ENCOUNTER — Ambulatory Visit: Payer: BC Managed Care – PPO

## 2021-02-28 DIAGNOSIS — E7800 Pure hypercholesterolemia, unspecified: Secondary | ICD-10-CM

## 2021-02-28 MED ORDER — ROSUVASTATIN CALCIUM 5 MG PO TABS: 5.0000 mg | ORAL_TABLET | Freq: Every day | ORAL | 3 refills | Status: DC

## 2021-02-28 NOTE — Telephone Encounter (Signed)
Please refer to last lab result-does patient need to continue to take monthly B12 injections ?

## 2021-03-01 NOTE — Telephone Encounter (Signed)
Okay to discontinue for the next 3 months.  Then I would like her to have a vitamin B12 level checked to determine whether or not she needs ongoing treatment with intramuscular injections.  Difficult to tell if her most recent vitamin B12 level was obtained before or after injection.  If it was obtained after her B12 injection this could skew the B12 level

## 2021-03-01 NOTE — Telephone Encounter (Signed)
Left message to call back  

## 2021-03-02 NOTE — Telephone Encounter (Signed)
Attempted to contact pt without a return call in over 3 days, will close encounter. 

## 2021-03-18 ENCOUNTER — Telehealth: Payer: Self-pay | Admitting: Family Medicine

## 2021-03-18 NOTE — Telephone Encounter (Signed)
Called patient, no answer 

## 2021-03-18 NOTE — Telephone Encounter (Signed)
Pt does not need to continue Vit B 12 injection.

## 2021-03-18 NOTE — Telephone Encounter (Signed)
Please advise, B12 level above normal when checked in June.

## 2021-03-21 NOTE — Telephone Encounter (Signed)
Returning nurse call.

## 2021-03-21 NOTE — Telephone Encounter (Signed)
Patient aware of recommendation.  

## 2021-03-25 ENCOUNTER — Encounter: Payer: Self-pay | Admitting: Family Medicine

## 2021-03-25 ENCOUNTER — Ambulatory Visit (INDEPENDENT_AMBULATORY_CARE_PROVIDER_SITE_OTHER): Payer: BC Managed Care – PPO | Admitting: Family Medicine

## 2021-03-25 DIAGNOSIS — J329 Chronic sinusitis, unspecified: Secondary | ICD-10-CM

## 2021-03-25 DIAGNOSIS — R2689 Other abnormalities of gait and mobility: Secondary | ICD-10-CM | POA: Diagnosis not present

## 2021-03-25 DIAGNOSIS — J310 Chronic rhinitis: Secondary | ICD-10-CM | POA: Diagnosis not present

## 2021-03-25 DIAGNOSIS — G44221 Chronic tension-type headache, intractable: Secondary | ICD-10-CM | POA: Diagnosis not present

## 2021-03-25 DIAGNOSIS — R413 Other amnesia: Secondary | ICD-10-CM | POA: Diagnosis not present

## 2021-03-25 MED ORDER — AMOXICILLIN-POT CLAVULANATE 875-125 MG PO TABS: 1.0000 | ORAL_TABLET | Freq: Two times a day (BID) | ORAL | 0 refills | Status: DC

## 2021-03-25 NOTE — Progress Notes (Signed)
Telephone visit  Subjective: [id number] PCP: [name], DO JJH:ERDEY H Demps is a 60 y.o. female calls for telephone consult today. Patient provides verbal consent for consult held via phone.  Due to COVID-19 pandemic this visit was conducted virtually. This visit type was conducted due to national recommendations for restrictions regarding the COVID-19 Pandemic (e.g. social distancing, sheltering in place) in an effort to limit this patient's exposure and mitigate transmission in our community. All issues noted in this document were discussed and addressed.  A physical exam was not performed with this format.   Location of patient: home Location of provider: [facility] Others present for call: none  1. Headaches She reports frequent headaches that are only relieved by caffeine, which keeps her awake.  Headaches have been going on for years but are worse recently.  Pain is fairly constant and exercise seems to exacerbate symptoms.  She reports bitemporal pain and occasional posterior head pain.  She reports that her gait was altered for a few hours but that has resolved.  She reports feeling really dizzy with positional changes.  She has used her nasal spray but it didn't help.  Nurtec did not relieve.  She goes onto report that she's had some nasal stuffiness and sore throat.  Home test negative for COVID yesterday.  Her GI symptoms have resolved but she has ongoing nasal stuffiness and sore throat.  She using Alka-Seltzer plus but again symptoms have not resolved.   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 (Patient taking differently: Take 1 tablet (10mg ) every morning and 2 tablets (30mg  every evening) for anxiety.), Disp: 360 tablet, Rfl: 2   escitalopram (LEXAPRO) 20 MG tablet, Take 1 tablet (20 mg total) by mouth daily., Disp: 90 tablet, Rfl: 3   ipratropium (ATROVENT) 0.03 % nasal spray, Place 2 sprays into both nostrils every 12 (twelve) hours., Disp: 30 mL, Rfl: 12   metoprolol tartrate (LOPRESSOR) 25 MG tablet, TAKE 1 TABLET BY MOUTH 2 TIMES DAILY. (NEEDS TO BE SEEN BEFORE NEXT REFILL) (Patient taking differently: Take 25 mg by mouth daily.), Disp: 60 tablet, Rfl: 0   Multiple Vitamin (MULTIVITAMIN) tablet, Take 1 tablet by mouth daily., Disp: , Rfl:    rosuvastatin (CRESTOR) 5 MG tablet, Take 1 tablet (5 mg total) by mouth daily., Disp: 90 tablet, Rfl: 3  Current Facility-Administered Medications:    cyanocobalamin ((VITAMIN B-12)) injection 1,000 mcg, 1,000 mcg, Intramuscular, Q30 days, Corney Knighton M, DO, 1,000 mcg at 01/26/21 1534   Assessment/ Plan: 60 y.o. female   Chronic tension-type headache, intractable - Plan: Ambulatory referral to Neurology, MR Brain Wo Contrast  Balance problem - Plan: Ambulatory referral to Neurology, MR Brain Wo Contrast  Memory problem - Plan: Ambulatory referral to Neurology,  MR Brain Wo Contrast  Rhinosinusitis - Plan: amoxicillin-clavulanate (AUGMENTIN) 875-125 MG tablet  Given the fact that she now has had intermittent balance issues I am quite concerned about this chronic headache with recent exacerbation.  I have placed a referral to neurology but also will be ordering an MRI of the brain.  We did reviewed that her CT scan last year did not show any acute intracranial processes but MRI may be more informative.  She has had  chronic issues with memory and thus far no MRI has been ordered.  To review symptoms have been refractory to Topamax, Nurtec and over-the-counter sinus medication.  It sounds like she may have a sinusitis going on and it does not sound bacterial at this point but I am giving her a pocket prescription to have for the week and should symptoms progress.  I encouraged her to repeat her COVID test and seek immediate medical attention if it is positive so that she may start antivirals.  She understands red flag signs and symptoms warranting further evaluation emergency department for both neurologic and infectious standpoint.  Start time: 6:39pm End time: 6:58  Total time spent on patient care (including telephone call/ virtual visit): 19 minutes  [place], [place] [phone]

## 2021-04-11 ENCOUNTER — Ambulatory Visit (HOSPITAL_COMMUNITY): Payer: BC Managed Care – PPO

## 2021-04-13 ENCOUNTER — Other Ambulatory Visit: Payer: Self-pay | Admitting: Family Medicine

## 2021-04-13 DIAGNOSIS — F418 Other specified anxiety disorders: Secondary | ICD-10-CM

## 2021-04-20 ENCOUNTER — Ambulatory Visit (HOSPITAL_COMMUNITY)
Admission: RE | Admit: 2021-04-20 | Discharge: 2021-04-20 | Disposition: A | Discharge status: Home / Self Care | Payer: BC Managed Care – PPO | Source: Ambulatory Visit | Attending: Family Medicine | Admitting: Family Medicine

## 2021-04-20 ENCOUNTER — Other Ambulatory Visit: Payer: BC Managed Care – PPO

## 2021-04-20 ENCOUNTER — Other Ambulatory Visit: Payer: Self-pay

## 2021-04-20 DIAGNOSIS — R2689 Other abnormalities of gait and mobility: Secondary | ICD-10-CM | POA: Insufficient documentation

## 2021-04-20 DIAGNOSIS — G44221 Chronic tension-type headache, intractable: Secondary | ICD-10-CM | POA: Insufficient documentation

## 2021-04-20 DIAGNOSIS — R413 Other amnesia: Secondary | ICD-10-CM | POA: Diagnosis present

## 2021-04-21 ENCOUNTER — Encounter: Payer: Self-pay | Admitting: Family Medicine

## 2021-04-21 NOTE — Progress Notes (Signed)
Pt wants results of mri

## 2021-04-22 ENCOUNTER — Other Ambulatory Visit: Payer: Self-pay | Admitting: Family Medicine

## 2021-04-22 DIAGNOSIS — F459 Somatoform disorder, unspecified: Secondary | ICD-10-CM

## 2021-04-22 DIAGNOSIS — F418 Other specified anxiety disorders: Secondary | ICD-10-CM

## 2021-05-05 ENCOUNTER — Telehealth: Payer: Self-pay | Admitting: Family Medicine

## 2021-05-05 DIAGNOSIS — R002 Palpitations: Secondary | ICD-10-CM

## 2021-05-05 MED ORDER — METOPROLOL TARTRATE 25 MG PO TABS: 25.0000 mg | ORAL_TABLET | Freq: Two times a day (BID) | ORAL | 0 refills | Status: DC

## 2021-05-05 NOTE — Telephone Encounter (Signed)
  Prescription Request  05/05/2021  Is this a "Controlled Substance" medicine? no Have you seen your PCP in the last 2 weeks?yes If YES, route message to pool  -  If NO, patient needs to be seen.  What is the name of the medication or equipment?lopreesor   Have you contacted your pharmacy to request a refill? no  Which [facility] you [facility]   Patient notified that their request is being sent to the clinical staff for review and that they should receive a response within 2 business days.

## 2021-05-05 NOTE — Telephone Encounter (Signed)
LMOVM refill sent to pharmacy 

## 2021-06-01 ENCOUNTER — Other Ambulatory Visit: Payer: Self-pay | Admitting: Family Medicine

## 2021-06-01 DIAGNOSIS — R002 Palpitations: Secondary | ICD-10-CM

## 2021-06-06 ENCOUNTER — Telehealth: Payer: Self-pay | Admitting: Family Medicine

## 2021-06-06 NOTE — Telephone Encounter (Signed)
REFERRAL REQUEST Telephone Note  Have you been seen at our office for this problem? no (Advise that they may need an appointment with their PCP before a referral can be done)  Reason for Referral: [facility]? Referral discussed with patient: yes Best contact number of patient for referral team:   [phone] Has patient been seen by a specialist for this issue before: no Patient provider preference for referral:  Patient location preference for referral:    Patient notified that referrals can take up to a week or longer to process. If they haven't heard anything within a week they should call back and speak with the referral department.    [facility] pt.  Please call pt.

## 2021-06-10 ENCOUNTER — Encounter: Payer: Self-pay | Admitting: Family Medicine

## 2021-06-10 NOTE — Telephone Encounter (Signed)
Courtney's last note: We have already sent a Referral to [facility] for this Patient - I LM for this Patient to give me a call back so I can give her the Office contact information to call and check on the scheduling status of her Referral.

## 2021-06-29 ENCOUNTER — Other Ambulatory Visit: Payer: Self-pay | Admitting: Family Medicine

## 2021-06-29 DIAGNOSIS — R002 Palpitations: Secondary | ICD-10-CM

## 2021-07-23 ENCOUNTER — Other Ambulatory Visit: Payer: Self-pay | Admitting: Family Medicine

## 2021-07-23 DIAGNOSIS — R002 Palpitations: Secondary | ICD-10-CM

## 2021-07-27 ENCOUNTER — Encounter: Payer: Self-pay | Admitting: Family Medicine

## 2021-07-27 ENCOUNTER — Other Ambulatory Visit: Payer: Self-pay

## 2021-07-27 ENCOUNTER — Ambulatory Visit: Payer: BC Managed Care – PPO | Admitting: Family Medicine

## 2021-07-27 VITALS — BP 111/68 | HR 67 | Temp 97.3°F | Ht 61.0 in | Wt 143.8 lb

## 2021-07-27 DIAGNOSIS — R748 Abnormal levels of other serum enzymes: Secondary | ICD-10-CM | POA: Diagnosis not present

## 2021-07-27 DIAGNOSIS — E7800 Pure hypercholesterolemia, unspecified: Secondary | ICD-10-CM | POA: Diagnosis not present

## 2021-07-27 DIAGNOSIS — F418 Other specified anxiety disorders: Secondary | ICD-10-CM

## 2021-07-27 NOTE — Patient Instructions (Signed)

## 2021-07-27 NOTE — Progress Notes (Signed)
Subjective: CC: Follow-up hyperlipidemia PCP: [name], DO Mandy Vargas is a 60 y.o. female presenting to clinic today for:  1.  Hyperlipidemia Patient is treated with Crestor 5 mg daily.  No reports of myalgia, chest pain.  2.  History of B12 deficiency Patient with history of B12 deficiency.  Her last B12 level was over normal and so B12 was discontinued.  She would like to have this rechecked.  Denies any balance issues, sensory changes in the lower extremities.  3.  Anxiety and depression Patient is now under the care of of [facility] psychiatrist.  Her next follow-up will not be until January.  Atarax was recently started and she notes that this has helped with sleep.  None of her other medications have been changed.  They are interested in pursuing some genetic testing and she is looking into her insurance for this.   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   Bruises easily    Depression    Nephrolithiasis    Status post lithotripsy   Palpitations    Personal history of radiation therapy    Wears glasses     Current Outpatient Medications:    buPROPion (WELLBUTRIN XL) 300 MG 24 hr tablet, Take 1 tablet (300 mg total) by mouth daily., Disp: 90 tablet, Rfl: 1   busPIRone (BUSPAR) 10 MG tablet, TAKE 1 TABLET ($RemoveB'10MG'MBItyJfF$ ) EVERY MORNING AND 3 TABLETS ($RemoveBe'30MG'aWfgCkUef$  EVERY EVENING) FOR ANXIETY., Disp: 360 tablet, Rfl: 2   escitalopram (LEXAPRO) 20 MG tablet, Take 1 tablet (20 mg total) by mouth daily., Disp: 90 tablet, Rfl: 3   metoprolol tartrate (LOPRESSOR) 25 MG tablet, TAKE 1 TABLET BY MOUTH TWICE A DAY, Disp: 60 tablet, Rfl: 0   Multiple Vitamin (MULTIVITAMIN) tablet, Take 1 tablet by mouth daily., Disp: , Rfl:    rosuvastatin (CRESTOR) 5 MG tablet, Take 1 tablet (5 mg total) by mouth daily., Disp: 90  tablet, Rfl: 3  Current Facility-Administered Medications:    cyanocobalamin ((VITAMIN B-12)) injection 1,000 mcg, 1,000 mcg, Intramuscular, Q30 days, Celestina Gironda M, DO, 1,000 mcg at 01/26/21 1534 Social History   Socioeconomic History   Marital status: Married    Spouse name: Not on file   Number of children: Not on file   Years of education: Not on file   Highest education level: Not on file  Occupational History   Not on file  Tobacco Use   Smoking status: Former    Packs/day: 0.25    Years: 1.00    Pack years: 0.25    Types: Cigarettes    Quit date: 09/12/2011    Years since quitting: 9.8   Smokeless tobacco: Never  Vaping Use   Vaping Use: Never used  Substance and Sexual Activity   Alcohol use: No    Alcohol/week: 0.0 standard drinks   Drug use: No   Sexual activity: Yes    Birth control/protection: Post-menopausal  Other Topics Concern   Not on file  Social History Narrative   Married, 1 son, 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[employer], 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   Social Determinants of Radio broadcast assistant Strain: Not on [employer] Insecurity: Not on file  Transportation Needs: Not on file  Physical  Activity: Not on file  Stress: Not on file  Social Connections: Not on file  Intimate Partner Violence: Not on file   Family History  Problem Relation Age of Onset   Colon cancer Mother    Alzheimer's disease Mother    Depression Mother    Breast cancer Sister    Cancer Sister 85       Breast   Bipolar disorder Sister    Heart attack Father 58       Deceased   Cancer Maternal Aunt        Breast   Colon cancer Paternal Grandmother        80'S   Healthy Son    Anesthesia problems Neg Hx     Objective: Office vital signs reviewed. BP 111/68   Pulse 67   Temp (!) 97.3 F (36.3 C)   Ht 5\' 1"  (1.549 m)   Wt 143 lb 12.8 oz (65.2 kg)   LMP 12/13/2011   SpO2 97%   BMI 27.17 kg/m   Physical Examination:  General: Awake, alert,  well nourished, No acute distress Cardio: regular rate and rhythm, S1S2 heard, no murmurs appreciated Pulm: clear to auscultation bilaterally, no wheezes, rhonchi or rales; normal work of breathing on room air GI: Soft, nontender.  No hepatosplenomegaly Psych: Eye contact fair.  Depression screen [facility] 2/9 07/27/2021 12/28/2020 06/09/2020  Decreased Interest 1 2 1   Down, Depressed, Hopeless 1 2 1   PHQ - 2 Score 2 4 2   Altered sleeping 1 2 1   Tired, decreased energy 1 2 3   Change in appetite 1 0 1  Feeling bad or failure about yourself  0 0 1  Trouble concentrating 1 2 1   Moving slowly or fidgety/restless 0 - 0  Suicidal thoughts 0 0 0  PHQ-9 Score 6 10 9   Difficult doing work/chores Somewhat difficult Somewhat difficult Somewhat difficult  Some recent data might be hidden   GAD 7 : Generalized Anxiety Score 07/27/2021 12/28/2020 01/02/2020 11/24/2019  Nervous, Anxious, on Edge 1 2 3 2   Control/stop worrying 1 2 3 2   Worry too much - different things 1 2 3 2   Trouble relaxing 1 2 3 2   Restless 1 0 3 2  Easily annoyed or irritable 1 3 3 2   Afraid - awful might happen 0 0 0 0  Total GAD 7 Score 6 11 18 12   Anxiety Difficulty Somewhat difficult Somewhat difficult Somewhat difficult Somewhat difficult      Assessment/ Plan: 60 y.o. female   Pure hypercholesterolemia - Plan: LDL Cholesterol, Direct, CMP14+EGFR  Elevated vitamin B12 level - Plan: Vitamin B12  Anxiety associated with depression  Recheck LDL only given nonfasting status, CMP.  Continue statin.  Check B12 level given elevation in B12.  She is currently off of B12.  No symptoms of concern today.  Ongoing anxiety and depression.  Currently on Atarax and resting better.  Continue to follow-up with psychiatry at [facility].  I gave her [facility] testing information again today as her psychiatrist is interested in pursuing genetic testing.  No orders of the defined types were placed in this encounter.  No orders of  the defined types were placed in this encounter.    [name], DO [place] [phone]

## 2021-07-28 LAB — CMP14+EGFR
ALT: 17 IU/L (ref 0–32)
AST: 24 IU/L (ref 0–40)
Albumin/Globulin Ratio: 2.2 (ref 1.2–2.2)
Albumin: 4.6 g/dL (ref 3.8–4.8)
Alkaline Phosphatase: 72 IU/L (ref 44–121)
BUN/Creatinine Ratio: 26 (ref 12–28)
BUN: 23 mg/dL (ref 8–27)
Bilirubin Total: <0.2 mg/dL (ref 0.0–1.2)
CO2: 24 mmol/L (ref 20–29)
Calcium: 9.5 mg/dL (ref 8.7–10.3)
Chloride: 100 mmol/L (ref 96–106)
Creatinine, Ser: 0.89 mg/dL (ref 0.57–1.00)
Globulin, Total: 2.1 g/dL (ref 1.5–4.5)
Glucose: 82 mg/dL (ref 70–99)
Potassium: 4.8 mmol/L (ref 3.5–5.2)
Sodium: 138 mmol/L (ref 134–144)
Total Protein: 6.7 g/dL (ref 6.0–8.5)
eGFR: 73 mL/min/{1.73_m2} (ref >59)

## 2021-07-28 LAB — VITAMIN B12: Vitamin B-12: 679 pg/mL (ref 232–1245)

## 2021-07-28 LAB — LDL CHOLESTEROL, DIRECT: LDL Direct: 111 mg/dL — ABNORMAL HIGH (ref 0–99)

## 2021-08-27 ENCOUNTER — Other Ambulatory Visit: Payer: Self-pay | Admitting: Family Medicine

## 2021-08-27 DIAGNOSIS — R002 Palpitations: Secondary | ICD-10-CM

## 2021-09-05 ENCOUNTER — Other Ambulatory Visit: Payer: Self-pay | Admitting: Family Medicine

## 2021-09-05 DIAGNOSIS — R002 Palpitations: Secondary | ICD-10-CM

## 2021-10-18 ENCOUNTER — Ambulatory Visit: Payer: BC Managed Care – PPO | Admitting: Nurse Practitioner

## 2021-10-18 ENCOUNTER — Encounter: Payer: Self-pay | Admitting: Nurse Practitioner

## 2021-10-18 ENCOUNTER — Telehealth: Payer: Self-pay | Admitting: Family Medicine

## 2021-10-18 ENCOUNTER — Ambulatory Visit (INDEPENDENT_AMBULATORY_CARE_PROVIDER_SITE_OTHER): Payer: BC Managed Care – PPO | Admitting: Nurse Practitioner

## 2021-10-18 DIAGNOSIS — Z91199 Patient's noncompliance with other medical treatment and regimen due to unspecified reason: Secondary | ICD-10-CM

## 2021-10-18 DIAGNOSIS — R110 Nausea: Secondary | ICD-10-CM | POA: Diagnosis not present

## 2021-10-18 MED ORDER — ONDANSETRON HCL 4 MG PO TABS: 4.0000 mg | ORAL_TABLET | Freq: Three times a day (TID) | ORAL | 1 refills | Status: AC | PRN

## 2021-10-18 NOTE — Progress Notes (Signed)
° °  Virtual Visit  Note Due to COVID-19 pandemic this visit was conducted virtually. This visit type was conducted due to national recommendations for restrictions regarding the COVID-19 Pandemic (e.g. social distancing, sheltering in place) in an effort to limit this patient's exposure and mitigate transmission in our community. All issues noted in this document were discussed and addressed.  A physical exam was not performed with this format.  I connected with Mandy Vargas on 10/18/21 at 5:11 pm  by telephone and verified that I am speaking with the correct person using two identifiers. Mandy Vargas is currently located at home during visit. The provider, Ivy Lynn, NP is located in their office at time of visit.  I discussed the limitations, risks, security and privacy concerns of performing an evaluation and management service by telephone and the availability of in person appointments. I also discussed with the patient that there may be a patient responsible charge related to this service. The patient expressed understanding and agreed to proceed.   History and Present Illness:  HPI  Patient is following up for medication induced Nausea. Patient started new medication about a month ago. No other symptoms associated with current concern.  Review of Systems  HENT: Negative.    Eyes: Negative.   Cardiovascular: Negative.   Gastrointestinal:  Positive for nausea.  Musculoskeletal: Negative.   All other systems reviewed and are negative.   Observations/Objective: Tele visit patient is not in distress  Assessment and Plan: Nausea caused by antidepressant medication. Provided education to patient on taking medication at a different time to help alleviate symptoms, Zofran 4 mg tablet for nausea.  Follow Up Instructions:  Follow up with worsening unresolved symptoms    I discussed the assessment and treatment plan with the patient. The patient was provided an opportunity to  ask questions and all were answered. The patient agreed with the plan and demonstrated an understanding of the instructions.   The patient was advised to call back or seek an in-person evaluation if the symptoms worsen or if the condition fails to improve as anticipated.  The above assessment and management plan was discussed with the patient. The patient verbalized understanding of and has agreed to the management plan. Patient is aware to call the clinic if symptoms persist or worsen. Patient is aware when to return to the clinic for a follow-up visit. Patient educated on when it is appropriate to go to the emergency department.   Time call ended: 5:20 pm     I provided 10 minutes of  non face-to-face time during this encounter.    Ivy Lynn, NP

## 2021-10-18 NOTE — Progress Notes (Signed)
Patient did not answer phone call 3 times

## 2021-10-27 ENCOUNTER — Telehealth: Payer: Self-pay | Admitting: Family Medicine

## 2021-10-27 NOTE — Telephone Encounter (Signed)
Returned call and answered questions 

## 2021-10-27 NOTE — Telephone Encounter (Signed)
Pt called requesting to speak with nurse about a medication. Wants to know if she has ever taken the medication before and if it would help her or not.

## 2021-12-06 ENCOUNTER — Other Ambulatory Visit: Payer: Self-pay | Admitting: Obstetrics and Gynecology

## 2021-12-06 DIAGNOSIS — Z1231 Encounter for screening mammogram for malignant neoplasm of breast: Secondary | ICD-10-CM

## 2021-12-07 ENCOUNTER — Encounter: Payer: Self-pay | Admitting: Family Medicine

## 2021-12-07 ENCOUNTER — Ambulatory Visit: Payer: BC Managed Care – PPO | Admitting: Family Medicine

## 2021-12-07 ENCOUNTER — Ambulatory Visit (INDEPENDENT_AMBULATORY_CARE_PROVIDER_SITE_OTHER): Payer: BC Managed Care – PPO

## 2021-12-07 VITALS — BP 106/67 | HR 54 | Temp 97.6°F | Ht 61.0 in | Wt 143.8 lb

## 2021-12-07 DIAGNOSIS — G8929 Other chronic pain: Secondary | ICD-10-CM | POA: Diagnosis not present

## 2021-12-07 DIAGNOSIS — M25561 Pain in right knee: Secondary | ICD-10-CM | POA: Diagnosis not present

## 2021-12-07 DIAGNOSIS — M79641 Pain in right hand: Secondary | ICD-10-CM

## 2021-12-07 DIAGNOSIS — R6889 Other general symptoms and signs: Secondary | ICD-10-CM
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
## 2021-12-07 NOTE — Patient Instructions (Signed)
Start Prilosec OTC once daily x1 month to see if this helps with congestion. I wonder if you have silent reflux ? ?Let me know if you want to see PT for the knee  ? ?Use voltaren gel on knee and hand for pain/ inflammation. ?

## 2021-12-15 NOTE — Progress Notes (Signed)
Patient calling back about labs. Please call back.  ?

## 2021-12-18 IMAGING — CT CT HEAD W/O CM
3 series · 15 of 47 positions shown, 18 images · non-contrast
Comparison: 08/27/2013 head CT.

CLINICAL DATA: Headache

EXAM:
CT HEAD WITHOUT CONTRAST
TECHNIQUE: Contiguous axial images were obtained from the base of the skull
through the vertex without intravenous contrast.

[Series 2: head w o · axial · 0.40mm/px · z∈[+89,+214]mm · 9 of 30 slices shown, 12 images]
[im 3/30  brain]
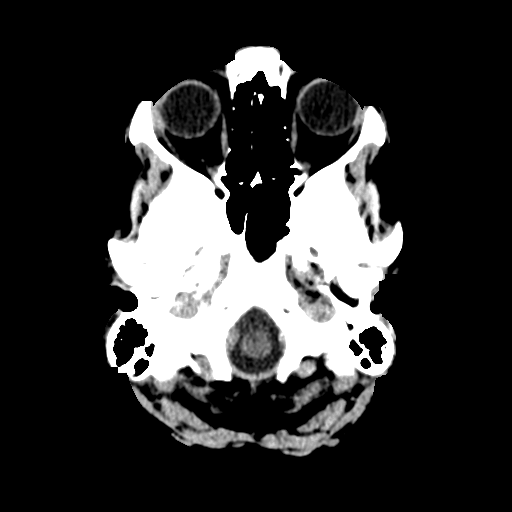
[im 3/30  bone]
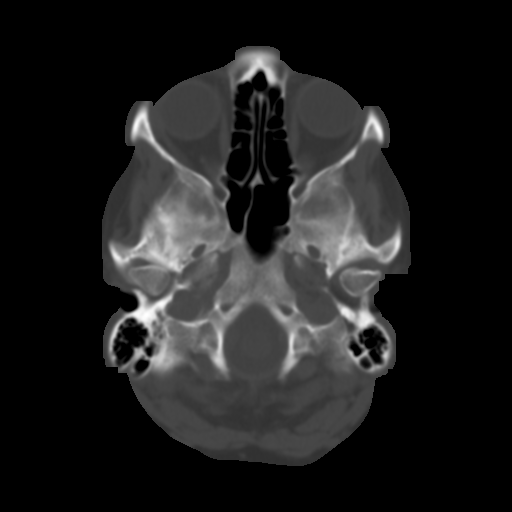
[im 6/30  brain]
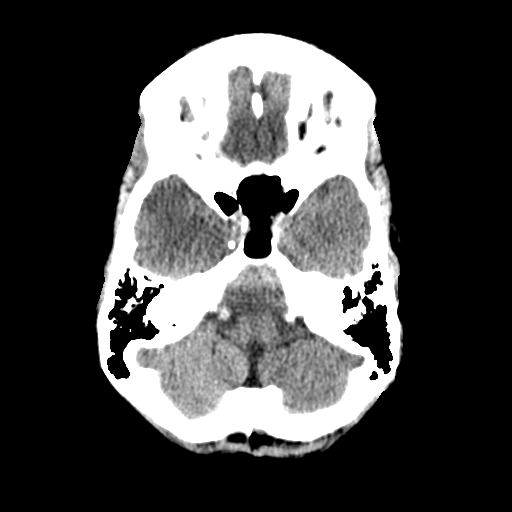
[im 9/30  brain]
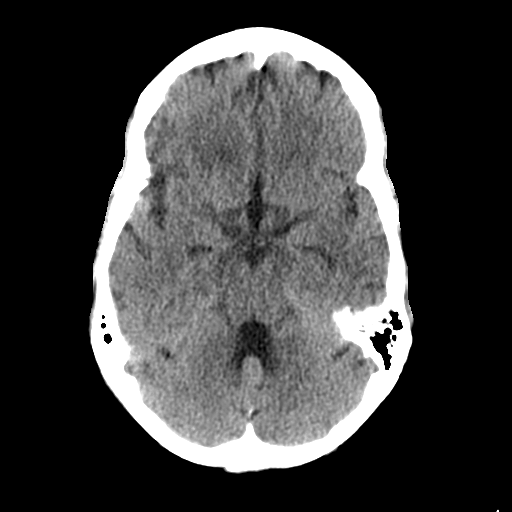
[im 12/30  brain]
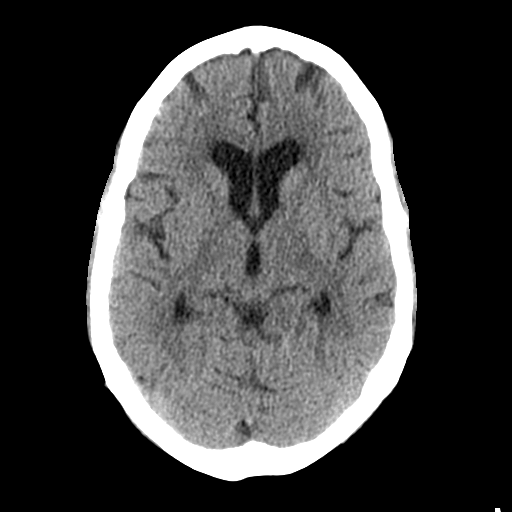
[im 16/30  brain]
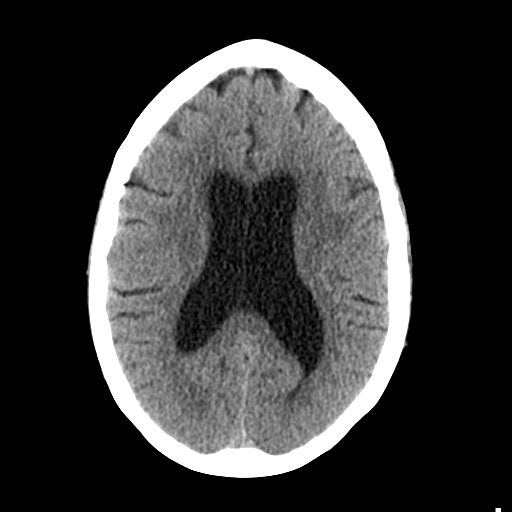
[im 16/30  bone]
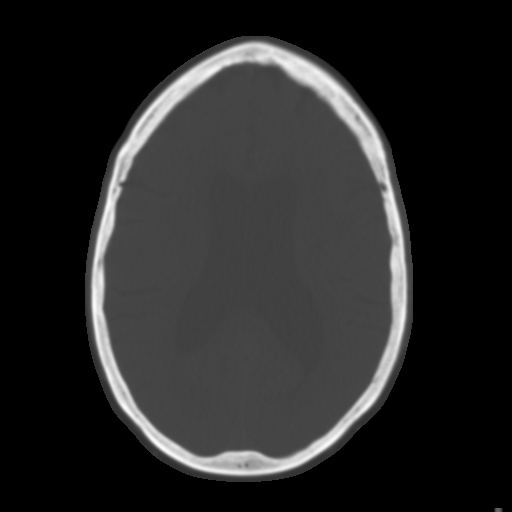
[im 19/30  brain]
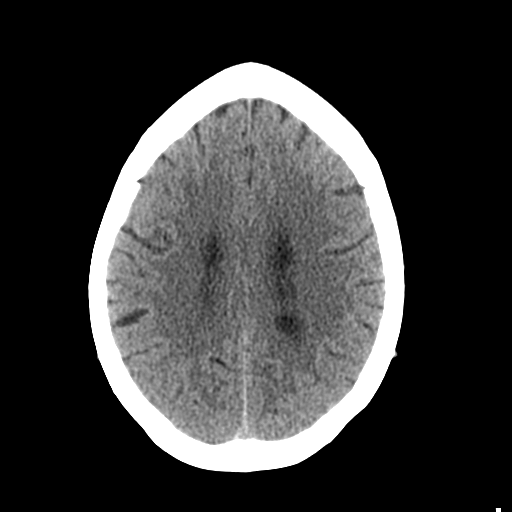
[im 22/30  brain]
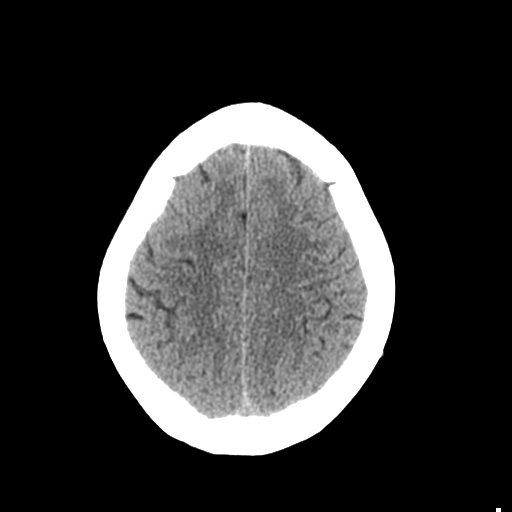
[im 25/30  brain]
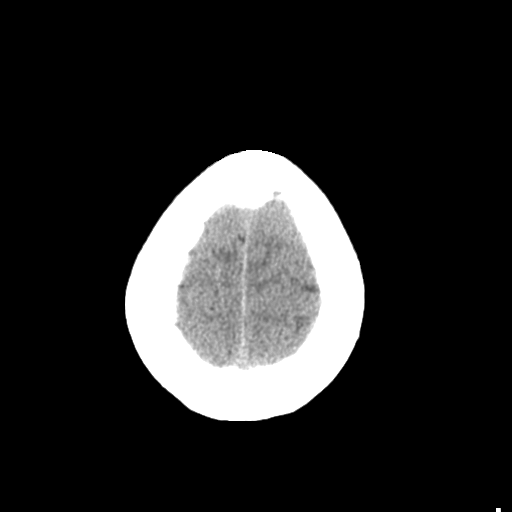
[im 28/30  brain]
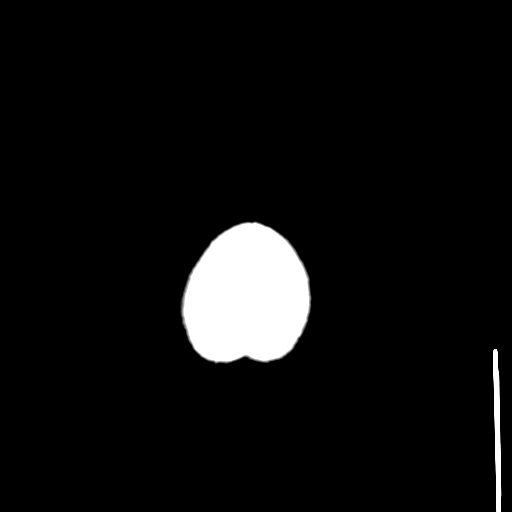
[im 28/30  bone]
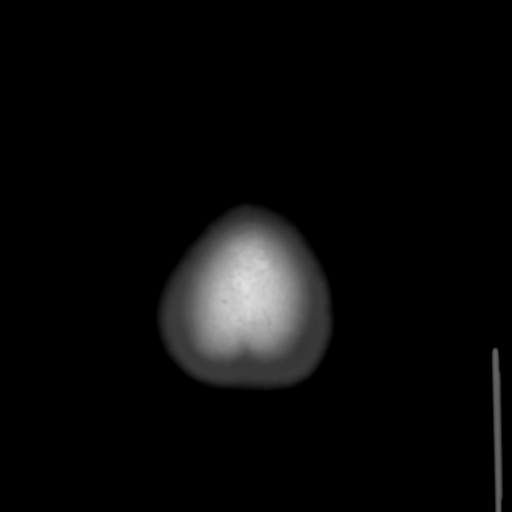

[Series 4: coronal soft · coronal · 0.29mm/px · 3 of 68 slices shown]
[im 23/68  brain]
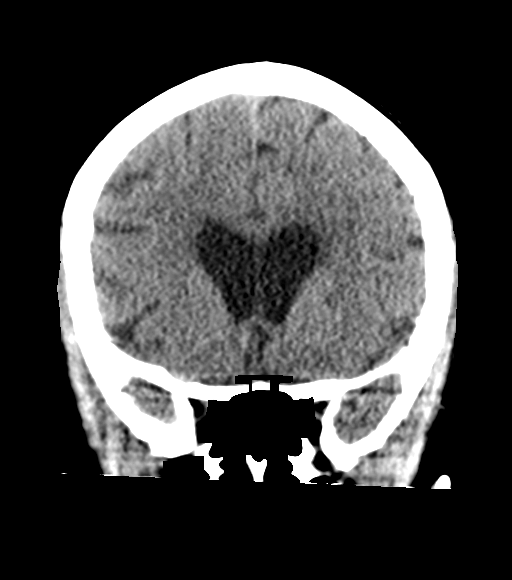
[im 30/68  brain]
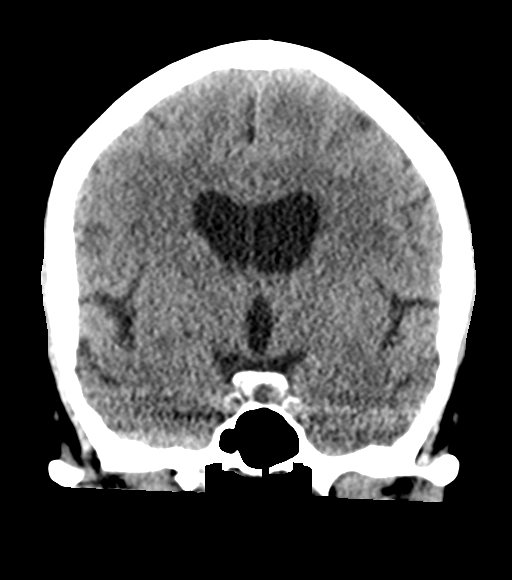
[im 38/68  brain]
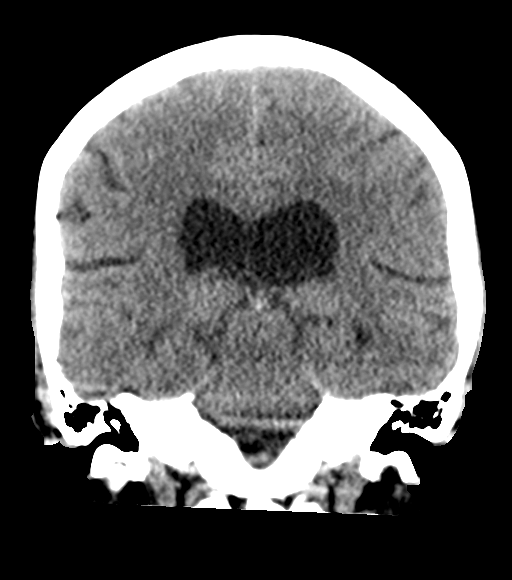

[Series 5: sagittal soft · sagittal · 0.29mm/px · 3 of 51 slices shown]
[im 17/51  brain]
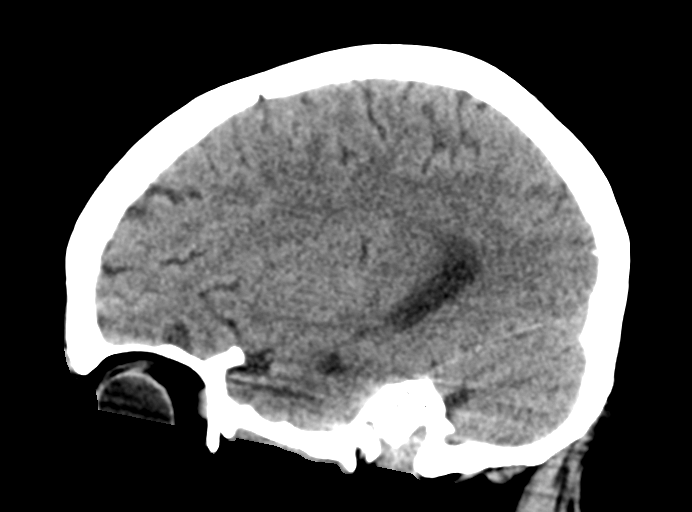
[im 26/51  brain]
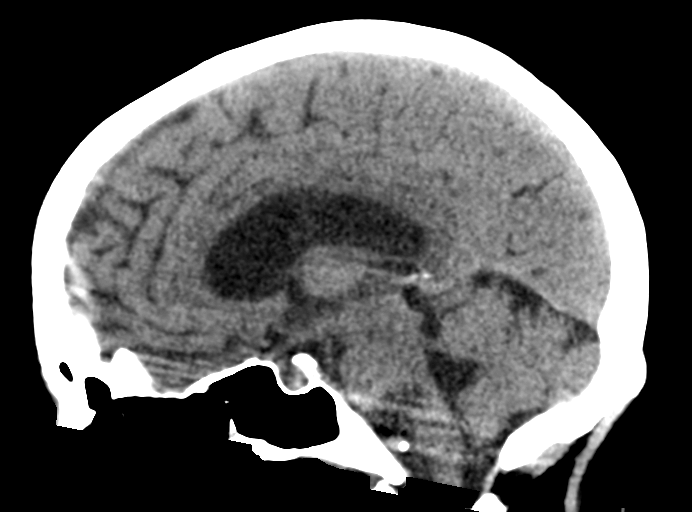
[im 34/51  brain]
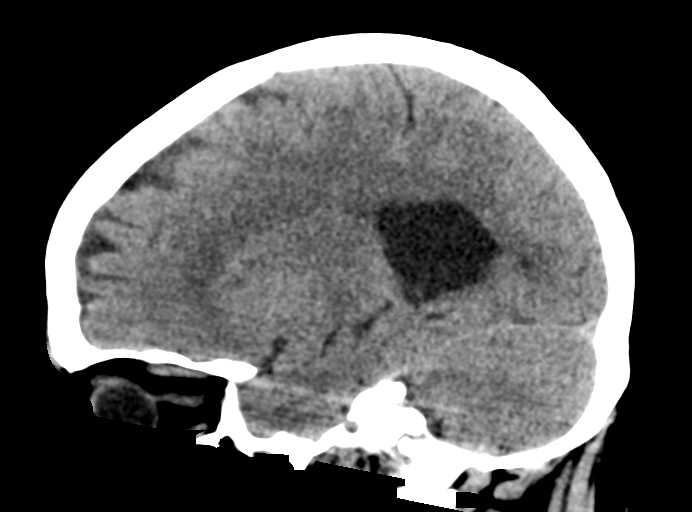

[15 of 47 positions shown; findings below may reference images not displayed]

FINDINGS: Brain: No acute infarct or intracranial hemorrhage. No mass lesion.
No midline shift, ventriculomegaly or extra-axial fluid collection.
Scattered periventricular white matter hypodense foci are unchanged
and likely reflect chronic microvascular ischemic changes.

Vascular: No hyperdense vessel or unexpected calcification.

Skull: Negative for fracture or focal lesion.

Sinuses/Orbits: Normal orbits. Clear paranasal sinuses. No mastoid
effusion.

Other: None.
IMPRESSION: No acute intracranial process.

Mild chronic microvascular ischemic changes.

## 2021-12-21 ENCOUNTER — Telehealth: Payer: Self-pay | Admitting: Family Medicine

## 2021-12-21 ENCOUNTER — Encounter: Payer: Self-pay | Admitting: *Deleted

## 2021-12-22 ENCOUNTER — Ambulatory Visit
Admission: RE | Admit: 2021-12-22 | Discharge: 2021-12-22 | Disposition: A | Discharge status: Home / Self Care | Payer: BC Managed Care – PPO | Source: Ambulatory Visit | Attending: Obstetrics and Gynecology | Admitting: Obstetrics and Gynecology

## 2021-12-22 DIAGNOSIS — Z1231 Encounter for screening mammogram for malignant neoplasm of breast: Secondary | ICD-10-CM

## 2022-02-10 ENCOUNTER — Ambulatory Visit: Payer: BC Managed Care – PPO | Admitting: Family Medicine

## 2022-02-10 ENCOUNTER — Encounter: Payer: Self-pay | Admitting: Family Medicine

## 2022-02-10 VITALS — BP 99/56 | HR 63 | Temp 97.2°F | Ht 61.0 in | Wt 143.0 lb

## 2022-02-10 DIAGNOSIS — F459 Somatoform disorder, unspecified: Secondary | ICD-10-CM

## 2022-02-10 DIAGNOSIS — F418 Other specified anxiety disorders: Secondary | ICD-10-CM

## 2022-02-10 NOTE — Patient Instructions (Addendum)
Your score is 3.2% so really you do not have to go up on your statin for cholesterol. Again, if you want to trial off you can and we can recheck in 3 months  Dr. Chucky May, MD Psychiatrist in [place], [place] Address: [address], [place], [place] [address] Hours:  Open ? Closes 5?PM Phone: [phone]   Ask your psychiatrist about Arman Filter as an option for treatment.

## 2022-02-10 NOTE — Progress Notes (Signed)
Subjective: KD:XIPJASN/ depression PCP: [name], DO [id number] H Cotterill is a 61 y.o. female presenting to clinic today for:  1. Anxiety/ depression [facility].  Her next OV is in a few weeks.  She is now off the Buspar and being treated with Luvox and Wellbutrin only.  She "doesn't feel good" and does not feel like her depression and anxiety are well controlled.  She's been through multiple meds in the past for treatment of her symptoms including Zoloft (did not work), Social worker (did not tolerate), Lexapro (did not work), Atarax, Ramelteon (for sleep) and valium.  She has been evaluated by neuropsych (Dr Norton Pastel at [facility]) for memory issues.  She continues to be concerned about possible alzheimer's disease since her mother suffered from this.  She has been evaluated for Alzheimers and there has been no evidence to suggest she has dementia but again she continues to desire further testing/ blood work/ imaging for this.  Last MRI 04/2021 and showed only mild chronic microvascular changes in white matter with mild parenchymal volume loss.  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  Thyroid  testing normal and B12 slightly elevated.   ROS: Per HPI  Allergies  Allergen Reactions   Amitriptyline     Tried '10mg'$  in past, made her feel "out of it"   Past Medical History:  Diagnosis Date   Anemia    Breast cancer ([place]) 2013   Left - DCIS   Bruises easily    Depression    Nephrolithiasis    Status post lithotripsy   Palpitations    Personal history of radiation therapy    Wears glasses     Current Outpatient Medications:    buPROPion (WELLBUTRIN XL) 300 MG 24 hr tablet, Take 1 tablet (300 mg total) by mouth daily., Disp: 90 tablet, Rfl: 1   busPIRone (BUSPAR) 10 MG tablet, TAKE 1 TABLET ('10MG'$ ) EVERY MORNING AND 3 TABLETS ('30MG'$  EVERY EVENING) FOR ANXIETY., Disp: 360 tablet, Rfl: 2   fluvoxaMINE (LUVOX) 25 MG tablet, Take 25 mg by  mouth at bedtime., Disp: , Rfl:    metoprolol tartrate (LOPRESSOR) 25 MG tablet, TAKE 1 TABLET BY MOUTH TWICE A DAY, Disp: 180 tablet, Rfl: 1   Multiple Vitamin (MULTIVITAMIN) tablet, Take 1 tablet by mouth daily., Disp: , Rfl:    ondansetron (ZOFRAN) 4 MG tablet, Take 1 tablet (4 mg total) by mouth every 8 (eight) hours as needed for nausea or vomiting., Disp: 20 tablet, Rfl: 1   rosuvastatin (CRESTOR) 5 MG tablet, Take 1 tablet (5 mg total) by mouth daily., Disp: 90 tablet, Rfl: 3  Current Facility-Administered Medications:    cyanocobalamin ((VITAMIN B-12)) injection 1,000 mcg, 1,000 mcg, Intramuscular, Q30 days, Laiylah Roettger M, DO, 1,000 mcg at 01/26/21 1534 Social History   Socioeconomic History   Marital status: Married    Spouse name: Not on file   Number of children: Not on file   Years of education: Not on file   Highest education level: Not on file  Occupational History   Not on file  Tobacco Use   Smoking status: Former    Packs/day: 0.25    Years: 1.00    Pack years: 0.25    Types: Cigarettes    Quit date: 09/12/2011    Years since quitting: 10.4   Smokeless tobacco: Never  Vaping Use   Vaping Use: Never used  Substance and Sexual Activity   Alcohol use: No  Alcohol/week: 0.0 standard drinks   Drug use: No   Sexual activity: Yes    Birth control/protection: Post-menopausal  Other Topics Concern   Not on file  Social History Narrative   Married, 1 son, 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[employer], 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   Social Determinants of Radio broadcast assistant Strain: Not on [employer] Insecurity: Not on file  Transportation Needs: Not on file  Physical Activity: Not on file  Stress: Not on file  Social Connections: Not on file  Intimate Partner Violence: Not on file   Family History  Problem Relation Age of Onset   Colon cancer Mother    Alzheimer's disease Mother    Depression Mother    Breast cancer Sister    Cancer Sister  9       Breast   Bipolar disorder Sister    Heart attack Father 39       Deceased   Cancer Maternal Aunt        Breast   Colon cancer Paternal Grandmother        80'S   Healthy Son    Anesthesia problems Neg Hx     Objective: Office vital signs reviewed. BP (!) 99/56   Pulse 63   Temp (!) 97.2 F (36.2 C)   Ht '5\' 1"'$  (1.549 m)   Wt 143 lb (64.9 kg)   LMP 12/13/2011   SpO2 96%   BMI 27.02 kg/m   Physical Examination:  General: Awake, alert, well nourished, No acute distress Psych: depressed, anxious appearing  NeuroCognitive testing with  Dr Vikki Ports (06/01/2021): "Mrs. 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 Compared to the previous assessment, mild improvements were noted on tasks measuring processing speed, mental sequencing, and letter fluency. All other scores were roughly equivalent. With regard to etiology, while not definitive, we remain concerned that most of her difficulties are related to significant mood disturbance. As such, it is our hope with amelioration of her psychiatric symptoms that her difficulties will abate. If not, then other etiologies can be entertained. Given certain challenges on visual tasks, further consultation regarding whether this is a primary vision issue or neurological in nature is recommended."      02/10/2022    2:09 PM 12/07/2021    9:34 AM 07/27/2021    3:58 PM  Depression screen PHQ 2/9  Decreased Interest '3 3 1  '$ Down, Depressed, Hopeless 3 0 1  PHQ - 2 Score '6 3 2  '$ Altered sleeping '3 3 1  '$ Tired, decreased energy '3 3 1  '$ Change in appetite '3 3 1  '$ Feeling bad or failure about yourself  0 0 0  Trouble concentrating '3 2 1  '$ Moving slowly or fidgety/restless 0 0 0  Suicidal thoughts 0 0 0  PHQ-9 Score '18 14 6  '$ Difficult doing work/chores Very difficult Somewhat difficult Somewhat difficult      02/10/2022    2:09 PM 12/07/2021    9:34 AM 07/27/2021    3:58 PM 12/28/2020    4:02 PM   GAD 7 : Generalized Anxiety Score  Nervous, Anxious, on Edge 3 0 1 2  Control/stop worrying '3 2 1 2  '$ Worry too much - different things '3 2 1 2  '$ Trouble relaxing '3 3 1 2  '$ Restless '2 2 1 '$ 0  Easily annoyed or irritable '3 3 1 3  '$ Afraid - awful might happen 0 0 0 0  Total GAD 7 Score  $'17 12 6 11  'a$ Anxiety Difficulty Very difficult Somewhat difficult Somewhat difficult Somewhat difficult    Assessment/ Plan: 61 y.o. female   Anxiety associated with depression - Plan: Ambulatory referral to Psychiatry  Psychosomatic disorder - Plan: Ambulatory referral to Psychiatry  I have placed a referral to Dr. Chucky May in [place] given her refractory depression.  Hopefully she can delve a little deeper into viable options for this patient who clearly has uncontrolled symptoms.  We discussed that I am not quite sure repeat imaging would be appropriate as she has had a pretty significant neurocognitive work-up and there is been no indication that she has any type of dementia whatsoever.  She is certainly welcome to have reevaluation with neurology for a second opinion but I do not think that this will change the current recommendations as much of her memory issues are felt to be neurocognitive in nature and likely psychosomatic manifestations of uncontrolled depression and anxiety.  No orders of the defined types were placed in this encounter.  No orders of the defined types were placed in this encounter.    [name], DO [place] [phone]

## 2022-02-20 ENCOUNTER — Ambulatory Visit: Payer: BC Managed Care – PPO | Admitting: Family Medicine

## 2022-02-26 ENCOUNTER — Other Ambulatory Visit: Payer: Self-pay | Admitting: Family Medicine

## 2022-05-11 ENCOUNTER — Telehealth: Payer: Self-pay | Admitting: Family Medicine

## 2022-05-11 NOTE — Telephone Encounter (Signed)
REFERRAL REQUEST Telephone Note  Have you been seen at our office for this problem? Ketamine-infustion therapy for depression (Advise that they may need an appointment with their PCP before a referral can be done)  Reason for Referral: depression Referral discussed with patient: yes  Best contact number of patient for referral team: [phone]    Has patient been seen by a specialist for this issue before: no  Patient provider preference for referral: [facility] [address] in [place] Patient location preference for referral: [facility] [address] in [place]  I asked pt if she transferred care and she said she did not the dr office is not helping her. Pt started crying. She said that Lajuana Ripple told her about this therapy and is asking Lajuana Ripple for help.   Pt last ov 02/10/2022. She can still schedule an apt. Is it ok to schedule with Lajuana Ripple again or will see need to establish with someone else? Please call back   Patient notified that referrals can take up to a week or longer to process. If they haven't heard anything within a week they should call back and speak with the referral department. ;

## 2022-05-11 NOTE — Telephone Encounter (Signed)
Patient stopped seeing G and June and wen to another pcp. Patient states they are not helping her wants to know if [facility] will take her back or will she need to see another provider? Please advise

## 2022-05-12 ENCOUNTER — Other Ambulatory Visit: Payer: Self-pay | Admitting: Family Medicine

## 2022-05-12 DIAGNOSIS — F418 Other specified anxiety disorders: Secondary | ICD-10-CM

## 2022-05-12 DIAGNOSIS — F459 Somatoform disorder, unspecified: Secondary | ICD-10-CM

## 2022-05-12 NOTE — Telephone Encounter (Signed)
Lmtcb.

## 2022-05-12 NOTE — Telephone Encounter (Signed)
She will need to est with one of the NPs/ Dr Livia Snellen.  I've been taking on extra patients as other patients transition off my panel.  I will be glad to place her referral however so as not to delay her care.

## 2022-09-02 ENCOUNTER — Other Ambulatory Visit: Payer: Self-pay | Admitting: Family Medicine

## 2022-09-02 DIAGNOSIS — R002 Palpitations: Secondary | ICD-10-CM

## 2022-12-01 ENCOUNTER — Other Ambulatory Visit: Payer: Self-pay | Admitting: Obstetrics and Gynecology

## 2022-12-01 DIAGNOSIS — Z0000 Encounter for general adult medical examination without abnormal findings: Secondary | ICD-10-CM

## 2022-12-25 ENCOUNTER — Ambulatory Visit
Admission: RE | Admit: 2022-12-25 | Discharge: 2022-12-25 | Disposition: A | Discharge status: Home / Self Care | Payer: BC Managed Care – PPO | Source: Ambulatory Visit | Attending: Obstetrics and Gynecology | Admitting: Obstetrics and Gynecology

## 2022-12-25 ENCOUNTER — Encounter: Payer: Self-pay | Admitting: *Deleted

## 2022-12-25 DIAGNOSIS — Z0000 Encounter for general adult medical examination without abnormal findings: Secondary | ICD-10-CM

## 2022-12-27 ENCOUNTER — Other Ambulatory Visit: Payer: Self-pay | Admitting: Obstetrics and Gynecology

## 2022-12-27 DIAGNOSIS — R928 Other abnormal and inconclusive findings on diagnostic imaging of breast: Secondary | ICD-10-CM

## 2023-01-10 ENCOUNTER — Ambulatory Visit: Payer: BC Managed Care – PPO

## 2023-01-10 ENCOUNTER — Ambulatory Visit
Admission: RE | Admit: 2023-01-10 | Discharge: 2023-01-10 | Disposition: A | Discharge status: Home / Self Care | Payer: BC Managed Care – PPO | Source: Ambulatory Visit | Attending: Obstetrics and Gynecology | Admitting: Obstetrics and Gynecology

## 2023-01-10 DIAGNOSIS — R928 Other abnormal and inconclusive findings on diagnostic imaging of breast: Secondary | ICD-10-CM

## 2023-12-03 ENCOUNTER — Other Ambulatory Visit: Payer: Self-pay | Admitting: Obstetrics and Gynecology

## 2023-12-03 DIAGNOSIS — Z1231 Encounter for screening mammogram for malignant neoplasm of breast: Secondary | ICD-10-CM

## 2023-12-19 ENCOUNTER — Other Ambulatory Visit (HOSPITAL_COMMUNITY): Payer: Self-pay | Admitting: Internal Medicine

## 2023-12-19 DIAGNOSIS — R911 Solitary pulmonary nodule: Secondary | ICD-10-CM

## 2023-12-26 ENCOUNTER — Ambulatory Visit
Admission: RE | Admit: 2023-12-26 | Discharge: 2023-12-26 | Disposition: A | Discharge status: Home / Self Care | Payer: Self-pay | Source: Ambulatory Visit | Attending: Obstetrics and Gynecology | Admitting: Obstetrics and Gynecology

## 2023-12-26 DIAGNOSIS — Z1231 Encounter for screening mammogram for malignant neoplasm of breast: Secondary | ICD-10-CM

## 2023-12-28 ENCOUNTER — Ambulatory Visit (HOSPITAL_COMMUNITY)
Admission: RE | Admit: 2023-12-28 | Discharge: 2023-12-28 | Disposition: A | Discharge status: Home / Self Care | Payer: Self-pay | Source: Ambulatory Visit | Attending: Internal Medicine | Admitting: Internal Medicine

## 2023-12-28 DIAGNOSIS — R911 Solitary pulmonary nodule: Secondary | ICD-10-CM | POA: Diagnosis present

## 2024-01-01 ENCOUNTER — Other Ambulatory Visit (HOSPITAL_COMMUNITY): Payer: Self-pay

## 2024-10-16 ENCOUNTER — Ambulatory Visit (HOSPITAL_BASED_OUTPATIENT_CLINIC_OR_DEPARTMENT_OTHER)
Admission: RE | Admit: 2024-10-16 | Discharge: 2024-10-16 | Disposition: A | Source: Ambulatory Visit | Attending: Nurse Practitioner | Admitting: Nurse Practitioner

## 2024-10-16 ENCOUNTER — Other Ambulatory Visit (HOSPITAL_BASED_OUTPATIENT_CLINIC_OR_DEPARTMENT_OTHER): Payer: Self-pay | Admitting: Nurse Practitioner

## 2024-10-16 DIAGNOSIS — M7031 Other bursitis of elbow, right elbow: Secondary | ICD-10-CM

## 2024-10-16 DIAGNOSIS — M25521 Pain in right elbow: Secondary | ICD-10-CM
